# Patient Record
Sex: Female | Born: 1935 | Race: White | Hispanic: No | Smoking: Former smoker
Health system: Southern US, Community
[De-identification: ages and names within clinical notes are randomized; demographics above are authoritative.]

## PROBLEM LIST (undated history)

## (undated) DIAGNOSIS — K635 Polyp of colon: Secondary | ICD-10-CM

## (undated) DIAGNOSIS — K589 Irritable bowel syndrome without diarrhea: Secondary | ICD-10-CM

## (undated) DIAGNOSIS — M47817 Spondylosis without myelopathy or radiculopathy, lumbosacral region: Secondary | ICD-10-CM

## (undated) DIAGNOSIS — I639 Cerebral infarction, unspecified: Secondary | ICD-10-CM

## (undated) DIAGNOSIS — M25519 Pain in unspecified shoulder: Secondary | ICD-10-CM

## (undated) DIAGNOSIS — E669 Obesity, unspecified: Secondary | ICD-10-CM

## (undated) DIAGNOSIS — N200 Calculus of kidney: Secondary | ICD-10-CM

## (undated) DIAGNOSIS — N951 Menopausal and female climacteric states: Secondary | ICD-10-CM

## (undated) DIAGNOSIS — E119 Type 2 diabetes mellitus without complications: Secondary | ICD-10-CM

## (undated) DIAGNOSIS — E785 Hyperlipidemia, unspecified: Secondary | ICD-10-CM

## (undated) DIAGNOSIS — K5790 Diverticulosis of intestine, part unspecified, without perforation or abscess without bleeding: Secondary | ICD-10-CM

## (undated) HISTORY — DX: Diverticulosis of intestine, part unspecified, without perforation or abscess without bleeding: K57.90

## (undated) HISTORY — PX: VAGINAL HYSTERECTOMY: SUR661

## (undated) HISTORY — DX: Cerebral infarction, unspecified: I63.9

## (undated) HISTORY — DX: Polyp of colon: K63.5

## (undated) HISTORY — DX: Menopausal and female climacteric states: N95.1

## (undated) HISTORY — PX: CARPAL TUNNEL RELEASE: SHX101

## (undated) HISTORY — DX: Type 2 diabetes mellitus without complications: E11.9

## (undated) HISTORY — PX: EYE SURGERY: SHX253

## (undated) HISTORY — DX: Spondylosis without myelopathy or radiculopathy, lumbosacral region: M47.817

## (undated) HISTORY — DX: Calculus of kidney: N20.0

## (undated) HISTORY — PX: OTHER SURGICAL HISTORY: SHX169

## (undated) HISTORY — DX: Obesity, unspecified: E66.9

## (undated) HISTORY — PX: CHOLECYSTECTOMY: SHX55

## (undated) HISTORY — DX: Hyperlipidemia, unspecified: E78.5

## (undated) HISTORY — DX: Pain in unspecified shoulder: M25.519

## (undated) HISTORY — DX: Irritable bowel syndrome, unspecified: K58.9

---

## 2000-04-13 ENCOUNTER — Ambulatory Visit (HOSPITAL_COMMUNITY): Admission: RE | Admit: 2000-04-13 | Discharge: 2000-04-13 | Payer: Self-pay | Admitting: Gastroenterology

## 2000-05-07 ENCOUNTER — Observation Stay (HOSPITAL_COMMUNITY): Admission: RE | Admit: 2000-05-07 | Discharge: 2000-05-08 | Payer: Self-pay | Admitting: Surgery

## 2000-11-05 ENCOUNTER — Ambulatory Visit (HOSPITAL_COMMUNITY): Admission: RE | Admit: 2000-11-05 | Discharge: 2000-11-05 | Payer: Self-pay | Admitting: Gastroenterology

## 2002-09-07 ENCOUNTER — Ambulatory Visit (HOSPITAL_COMMUNITY): Admission: RE | Admit: 2002-09-07 | Discharge: 2002-09-07 | Payer: Self-pay | Admitting: Family Medicine

## 2002-09-07 ENCOUNTER — Encounter: Payer: Self-pay | Admitting: Family Medicine

## 2003-08-02 ENCOUNTER — Ambulatory Visit (HOSPITAL_COMMUNITY): Admission: RE | Admit: 2003-08-02 | Discharge: 2003-08-02 | Payer: Self-pay | Admitting: Family Medicine

## 2006-04-05 LAB — HM DEXA SCAN

## 2006-09-07 ENCOUNTER — Other Ambulatory Visit: Admission: RE | Admit: 2006-09-07 | Discharge: 2006-09-07 | Payer: Self-pay | Admitting: Family Medicine

## 2010-08-14 LAB — HM PAP SMEAR

## 2010-12-03 LAB — HEMOCCULT GUIAC POC 1CARD (OFFICE)

## 2010-12-03 LAB — HM DIABETES FOOT EXAM

## 2011-01-21 ENCOUNTER — Encounter: Payer: Self-pay | Admitting: Family Medicine

## 2011-01-21 DIAGNOSIS — E669 Obesity, unspecified: Secondary | ICD-10-CM

## 2011-01-21 DIAGNOSIS — M47817 Spondylosis without myelopathy or radiculopathy, lumbosacral region: Secondary | ICD-10-CM | POA: Insufficient documentation

## 2011-01-21 DIAGNOSIS — M25519 Pain in unspecified shoulder: Secondary | ICD-10-CM | POA: Insufficient documentation

## 2011-01-21 DIAGNOSIS — N200 Calculus of kidney: Secondary | ICD-10-CM | POA: Insufficient documentation

## 2011-01-21 DIAGNOSIS — E1169 Type 2 diabetes mellitus with other specified complication: Secondary | ICD-10-CM | POA: Insufficient documentation

## 2011-01-21 DIAGNOSIS — K589 Irritable bowel syndrome without diarrhea: Secondary | ICD-10-CM | POA: Insufficient documentation

## 2011-01-21 DIAGNOSIS — K635 Polyp of colon: Secondary | ICD-10-CM | POA: Insufficient documentation

## 2011-01-21 DIAGNOSIS — K5790 Diverticulosis of intestine, part unspecified, without perforation or abscess without bleeding: Secondary | ICD-10-CM | POA: Insufficient documentation

## 2011-01-21 DIAGNOSIS — K209 Esophagitis, unspecified without bleeding: Secondary | ICD-10-CM | POA: Insufficient documentation

## 2011-01-21 DIAGNOSIS — E785 Hyperlipidemia, unspecified: Secondary | ICD-10-CM | POA: Insufficient documentation

## 2011-01-21 DIAGNOSIS — N951 Menopausal and female climacteric states: Secondary | ICD-10-CM

## 2012-02-04 LAB — FECAL OCCULT BLOOD, GUAIAC: Fecal Occult Blood: NEGATIVE

## 2012-11-13 ENCOUNTER — Encounter: Payer: Self-pay | Admitting: Family Medicine

## 2012-11-13 DIAGNOSIS — H269 Unspecified cataract: Secondary | ICD-10-CM

## 2012-11-13 DIAGNOSIS — I10 Essential (primary) hypertension: Secondary | ICD-10-CM

## 2012-12-20 ENCOUNTER — Other Ambulatory Visit: Payer: Self-pay | Admitting: *Deleted

## 2012-12-20 DIAGNOSIS — M949 Disorder of cartilage, unspecified: Secondary | ICD-10-CM

## 2012-12-23 ENCOUNTER — Ambulatory Visit: Payer: Self-pay | Admitting: Family Medicine

## 2012-12-24 ENCOUNTER — Encounter: Payer: Self-pay | Admitting: Family Medicine

## 2012-12-24 ENCOUNTER — Ambulatory Visit (INDEPENDENT_AMBULATORY_CARE_PROVIDER_SITE_OTHER): Payer: Medicare Other | Admitting: Family Medicine

## 2012-12-24 VITALS — BP 130/79 | HR 113 | Temp 98.1°F | Ht 72.0 in | Wt 145.0 lb

## 2012-12-24 DIAGNOSIS — G2581 Restless legs syndrome: Secondary | ICD-10-CM

## 2012-12-24 DIAGNOSIS — E785 Hyperlipidemia, unspecified: Secondary | ICD-10-CM

## 2012-12-24 DIAGNOSIS — D649 Anemia, unspecified: Secondary | ICD-10-CM

## 2012-12-24 DIAGNOSIS — I10 Essential (primary) hypertension: Secondary | ICD-10-CM

## 2012-12-24 DIAGNOSIS — E119 Type 2 diabetes mellitus without complications: Secondary | ICD-10-CM

## 2012-12-24 LAB — POCT CBC
Granulocyte percent: 63.3 %G (ref 37–80)
MCH, POC: 32.2 pg — AB (ref 27–31.2)
MCV: 89.5 fL (ref 80–97)
POC LYMPH PERCENT: 29.3 %L (ref 10–50)
Platelet Count, POC: 334 10*3/uL (ref 142–424)
RDW, POC: 11.9 %

## 2012-12-24 LAB — BASIC METABOLIC PANEL WITH GFR
BUN: 13 mg/dL (ref 6–23)
CO2: 27 mEq/L (ref 19–32)
Chloride: 92 mEq/L — ABNORMAL LOW (ref 96–112)
Creat: 0.84 mg/dL (ref 0.50–1.10)
Glucose, Bld: 127 mg/dL — ABNORMAL HIGH (ref 70–99)

## 2012-12-24 LAB — LIPID PANEL
LDL Cholesterol: 40 mg/dL (ref 0–99)
Triglycerides: 238 mg/dL — ABNORMAL HIGH (ref <150)

## 2012-12-24 LAB — HEPATIC FUNCTION PANEL
Alkaline Phosphatase: 67 U/L (ref 39–117)
Indirect Bilirubin: 0.4 mg/dL (ref 0.0–0.9)
Total Bilirubin: 0.5 mg/dL (ref 0.3–1.2)

## 2012-12-24 MED ORDER — PRAMIPEXOLE DIHYDROCHLORIDE 0.125 MG PO TABS: 0.1250 mg | ORAL_TABLET | Freq: Three times a day (TID) | ORAL | Status: DC

## 2012-12-24 NOTE — Progress Notes (Deleted)
  Subjective:    Patient ID: Theresa Watson, female    DOB: 09-01-1933, 79 y.o.   MRN: [id number]  HPI    Review of Systems  Constitutional: Negative.   HENT: Negative.   Respiratory: Negative.   Cardiovascular: Negative.   Gastrointestinal: Positive for diarrhea (occasional).  Genitourinary: Negative.   Musculoskeletal: Positive for back pain (LBP).  Neurological: Negative.   Psychiatric/Behavioral: Positive for sleep disturbance (occasional). Negative for suicidal ideas (occasional).       Objective:   Physical Exam        Assessment & Plan:

## 2012-12-24 NOTE — Progress Notes (Signed)
  Subjective:    Patient ID: Theresa Watson, female    DOB: 02/02/1933, 79 y.o.   MRN: [id number]  HPI This patient presents for recheck of multiple medical problems.   Patient Active Problem List  Diagnosis  . Symptomatic menopausal or female climacteric states  . Other and unspecified hyperlipidemia  . Esophagitis  . Prolapse of vaginal walls without mention of uterine prolapse  . Obesity, mild  . IBS (irritable bowel syndrome)  . Kidney stone  . Lumbosacral spondylosis without myelopathy  . Pain in joint, shoulder region  . Colon polyp  . Diverticulosis  . Hypertension  . Cataracts, bilateral    In addition,   The allergies, current medications, past medical history, surgical history, family and social history are reviewed.  Immunizations reviewed.  Health maintenance reviewed.   Patient brings in her eye exam report for review. She also brings records of her blood sugars for the past 3-4 months. These records shows fairly good blood sugar control throughout the day.   Review of Systems  Respiratory: Negative.   Cardiovascular: Negative.   Gastrointestinal: Positive for diarrhea.  Musculoskeletal: Positive for back pain (LBP).  Neurological: Negative.   Psychiatric/Behavioral: Positive for sleep disturbance (occasional).   she has upper and lower dentures     Objective:   Physical Exam BP 130/79  Pulse 113  Temp(Src) 98.1 F (36.7 C) (Oral)  Ht 6' (1.829 m)  Wt 145 lb (65.772 kg)  BMI 19.66 kg/m2  The patient appeared well nourished and normally developed, alert and oriented to time and place. Speech, behavior and judgement appear normal. Vital signs as documented.  Head exam is unremarkable. No scleral icterus or pallor noted.  Neck is without jugular venous distension, thyromegally, or carotid bruits. Carotid upstrokes are brisk bilaterally. No cervical adenopathy. Lungs are clear anteriorly and posteriorly to auscultation. Normal respiratory  effort. Cardiac exam reveals regular rate and rhythm slightly over 100 . First and second heart sounds normal. No murmurs, rubs or gallops.  Abdominal exam reveals normal bowl sounds, no masses, no organomegaly and no aortic enlargement. No inguinal adenopathy. Extremities are nonedematous and both femoral and pedal pulses are normal. Skin without pallor or jaundice.  Warm and dry, without rash. Neurologic exam reveals normal deep tendon reflexes and normal sensation. Diabetic foot exam was done         Assessment & Plan:  1. Anemia  - POCT CBC - Vitamin D 25 hydroxy  2. Other and unspecified hyperlipidemia  - Lipid panel - Hepatic function panel  3. Essential hypertension, benign  - BASIC METABOLIC PANEL WITH GFR  4. Diabetes  - POCT glycosylated hemoglobin (Hb A1C)  5. Restless leg syndrome  - pramipexole (MIRAPEX) 0.125 MG tablet; Take 1 tablet (0.125 mg total) by mouth 3 (three) times daily.  Dispense: 90 tablet; Refill: 2

## 2012-12-24 NOTE — Patient Instructions (Addendum)
Continue current meds and therapeutic lifestyle changes Decrease caffeine intake . Limit the number of cups of coffee daily Exercise regularly

## 2013-01-18 ENCOUNTER — Other Ambulatory Visit: Payer: Self-pay | Admitting: Family Medicine

## 2013-01-18 NOTE — Telephone Encounter (Signed)
Last labs 8/13

## 2013-02-16 ENCOUNTER — Ambulatory Visit (INDEPENDENT_AMBULATORY_CARE_PROVIDER_SITE_OTHER): Payer: Medicare Other

## 2013-02-16 ENCOUNTER — Ambulatory Visit: Payer: Self-pay

## 2013-02-16 DIAGNOSIS — M899 Disorder of bone, unspecified: Secondary | ICD-10-CM

## 2013-02-21 ENCOUNTER — Other Ambulatory Visit: Payer: Self-pay | Admitting: Nurse Practitioner

## 2013-03-01 ENCOUNTER — Telehealth: Payer: Self-pay | Admitting: Pharmacist

## 2013-03-01 NOTE — Telephone Encounter (Signed)
Patient had DEXA 02/16/13 - results were not discussed with patient that day because I was out of office.  Recommend appointment needed to discuss results and treatment option.  Tried to call - no ans - left message.

## 2013-03-06 ENCOUNTER — Other Ambulatory Visit: Payer: Self-pay | Admitting: Nurse Practitioner

## 2013-03-07 ENCOUNTER — Telehealth: Payer: Self-pay | Admitting: Family Medicine

## 2013-03-08 ENCOUNTER — Other Ambulatory Visit: Payer: Self-pay | Admitting: *Deleted

## 2013-03-08 MED ORDER — TRIAMTERENE-HCTZ 37.5-25 MG PO TABS: 1.0000 | ORAL_TABLET | Freq: Every day | ORAL | Status: DC

## 2013-03-08 MED ORDER — POTASSIUM CHLORIDE CRYS ER 20 MEQ PO TBCR: 20.0000 meq | EXTENDED_RELEASE_TABLET | Freq: Every day | ORAL | Status: DC

## 2013-03-08 MED ORDER — PRAVASTATIN SODIUM 40 MG PO TABS: 40.0000 mg | ORAL_TABLET | Freq: Every day | ORAL | Status: DC

## 2013-03-08 NOTE — Telephone Encounter (Signed)
done

## 2013-03-16 ENCOUNTER — Other Ambulatory Visit (INDEPENDENT_AMBULATORY_CARE_PROVIDER_SITE_OTHER): Payer: Medicare Other

## 2013-03-16 DIAGNOSIS — E559 Vitamin D deficiency, unspecified: Secondary | ICD-10-CM

## 2013-03-16 DIAGNOSIS — I10 Essential (primary) hypertension: Secondary | ICD-10-CM

## 2013-03-16 DIAGNOSIS — R5381 Other malaise: Secondary | ICD-10-CM

## 2013-03-16 DIAGNOSIS — E785 Hyperlipidemia, unspecified: Secondary | ICD-10-CM

## 2013-03-16 DIAGNOSIS — R5383 Other fatigue: Secondary | ICD-10-CM

## 2013-03-16 LAB — POCT CBC
Granulocyte percent: 60.6 %G (ref 37–80)
HCT, POC: 36.7 % — AB (ref 37.7–47.9)
Lymph, poc: 1.8 (ref 0.6–3.4)
MCH, POC: 31.3 pg — AB (ref 27–31.2)
MCV: 90.9 fL (ref 80–97)
Platelet Count, POC: 321 10*3/uL (ref 142–424)
RBC: 4 M/uL — AB (ref 4.04–5.48)
WBC: 5.6 10*3/uL (ref 4.6–10.2)

## 2013-03-16 LAB — BASIC METABOLIC PANEL WITH GFR
BUN: 12 mg/dL (ref 6–23)
Chloride: 99 mEq/L (ref 96–112)
Creat: 0.81 mg/dL (ref 0.50–1.10)
GFR, Est Non African American: 70 mL/min
Glucose, Bld: 112 mg/dL — ABNORMAL HIGH (ref 70–99)
Potassium: 4.1 mEq/L (ref 3.5–5.3)

## 2013-03-16 LAB — HEPATIC FUNCTION PANEL
ALT: 18 U/L (ref 0–35)
AST: 18 U/L (ref 0–37)
Albumin: 4.5 g/dL (ref 3.5–5.2)
Alkaline Phosphatase: 65 U/L (ref 39–117)
Indirect Bilirubin: 0.4 mg/dL (ref 0.0–0.9)
Total Protein: 6.4 g/dL (ref 6.0–8.3)

## 2013-03-16 NOTE — Progress Notes (Unsigned)
Patient came in for labs only.

## 2013-03-17 LAB — NMR LIPOPROFILE WITH LIPIDS
HDL Particle Number: 43.6 umol/L (ref >=30.5)
HDL Size: 9 nm — ABNORMAL LOW (ref >=9.2)
LDL Size: 19.9 nm — ABNORMAL LOW (ref >20.5)
Large HDL-P: 7.5 umol/L (ref >=4.8)
Large VLDL-P: 1.5 nmol/L (ref <=2.7)
Small LDL Particle Number: 714 nmol/L — ABNORMAL HIGH (ref <=527)

## 2013-03-17 LAB — VITAMIN D 25 HYDROXY (VIT D DEFICIENCY, FRACTURES): Vit D, 25-Hydroxy: 58 ng/mL (ref 30–89)

## 2013-03-23 ENCOUNTER — Other Ambulatory Visit: Payer: Self-pay | Admitting: Family Medicine

## 2013-03-30 ENCOUNTER — Encounter: Payer: Self-pay | Admitting: Family Medicine

## 2013-03-30 ENCOUNTER — Other Ambulatory Visit: Payer: Self-pay | Admitting: Family Medicine

## 2013-03-30 ENCOUNTER — Ambulatory Visit (INDEPENDENT_AMBULATORY_CARE_PROVIDER_SITE_OTHER): Payer: Medicare Other | Admitting: Family Medicine

## 2013-03-30 VITALS — BP 123/77 | HR 77 | Temp 98.4°F | Ht 60.0 in | Wt 145.0 lb

## 2013-03-30 DIAGNOSIS — I10 Essential (primary) hypertension: Secondary | ICD-10-CM

## 2013-03-30 DIAGNOSIS — M949 Disorder of cartilage, unspecified: Secondary | ICD-10-CM

## 2013-03-30 DIAGNOSIS — M2550 Pain in unspecified joint: Secondary | ICD-10-CM

## 2013-03-30 DIAGNOSIS — E785 Hyperlipidemia, unspecified: Secondary | ICD-10-CM

## 2013-03-30 DIAGNOSIS — K219 Gastro-esophageal reflux disease without esophagitis: Secondary | ICD-10-CM

## 2013-03-30 DIAGNOSIS — M8580 Other specified disorders of bone density and structure, unspecified site: Secondary | ICD-10-CM

## 2013-03-30 DIAGNOSIS — E119 Type 2 diabetes mellitus without complications: Secondary | ICD-10-CM

## 2013-03-30 LAB — POCT GLYCOSYLATED HEMOGLOBIN (HGB A1C): Hemoglobin A1C: 5.5

## 2013-03-30 LAB — POCT UA - MICROALBUMIN: Microalbumin Ur, POC: NEGATIVE mg/L

## 2013-03-30 NOTE — Progress Notes (Signed)
  Subjective:    Patient ID: Theresa Watson, female    DOB: 01-11-1933, 79 y.o.   MRN: [id number]  HPI Patient comes in today for followup of chronic medical problems and to review her DEXA scan. Patient has a history of cholesterol hypertension reflux and arthralgias. Currently she is only taking calcium and vitamin D for her bones.   Review of Systems  HENT: Negative for ear pain, congestion, sore throat, rhinorrhea, neck pain, postnasal drip and tinnitus.   Eyes: Negative for visual disturbance.  Respiratory: Negative for cough, shortness of breath and wheezing.   Cardiovascular: Negative for chest pain, palpitations and leg swelling.  Gastrointestinal: Negative for vomiting, abdominal pain, diarrhea (she has occasional loose stools which she's had for a long time), constipation, blood in stool, abdominal distention and anal bleeding.       Stools are dark because she is currently taking iron  Genitourinary: Negative for dysuria, urgency, frequency, hematuria, vaginal bleeding, vaginal discharge, difficulty urinating and vaginal pain.  Musculoskeletal: Positive for back pain (Occasional back) and arthralgias (occasional pain in both feet).  Skin: Negative for rash.  Allergic/Immunologic: Negative for environmental allergies.  Neurological: Negative for dizziness, weakness, light-headedness and headaches.  Psychiatric/Behavioral: Negative.  The patient is not nervous/anxious.        Objective:   Physical Exam BP 123/77  Pulse 77  Temp(Src) 98.4 F (36.9 C) (Oral)  Ht 5' (1.524 m)  Wt 145 lb (65.772 kg)  BMI 28.32 kg/m2  The patient appeared well nourished and normally developed for her age, alert and oriented to time and place. Speech, behavior and judgement appear normal. Vital signs as documented.  Head exam is unremarkable. No scleral icterus or pallor noted. Ears nose and throat were within normal limits Neck is without jugular venous distension, thyromegally, or carotid  bruits. Carotid upstrokes are brisk bilaterally. No cervical adenopathy. Lungs are clear anteriorly and posteriorly to auscultation. Normal respiratory effort. Cardiac exam reveals regular rate and rhythm at 72 per minute. First and second heart sounds normal.  No murmurs, rubs or gallops.  Abdominal exam reveals normal bowl sounds, no masses, no organomegaly and no aortic enlargement. No inguinal adenopathy. Extremities are nonedematous and both femoral and pedal pulses are normal. Skin without pallor or jaundice.  Warm and dry, without rash. Neurologic exam reveals normal deep tendon reflexes and normal sensation.  Diabetic foot exam was done  Recent labs were reviewed with patient. Hemoglobin A1c was done today because it did not get done with a previous lab work        Assessment & Plan:  Hyperlipidemia  Hypertension  Arthralgia  GERD (gastroesophageal reflux disease)  Osteopenia  Diabetes - Plan: POCT glycosylated hemoglobin (Hb A1C), POCT UA - Microalbumin  Patient Instructions  Always be careful and did not fall. Watch for you were going. Use night lights in the house. Don'tl have throw rugs in the house. Continue follow your diet and watch what you eat and get plenty of exercise Continue current vitamin D and calcium Continue current iron medication Continued potassium is slightly doing one daily except 3 days a week he take 2 to

## 2013-03-30 NOTE — Patient Instructions (Addendum)
Always be careful and did not fall. Watch for you were going. Use night lights in the house. Don'tl have throw rugs in the house. Continue follow your diet and watch what you eat and get plenty of exercise Continue current vitamin D and calcium Continue current iron medication Continued potassium is slightly doing one daily except 3 days a week he take 2 to

## 2013-04-20 ENCOUNTER — Telehealth: Payer: Self-pay | Admitting: *Deleted

## 2013-04-20 NOTE — Telephone Encounter (Signed)
Message copied by Bearl Mulberry on Wed Apr 20, 2013  5:55 PM ------      Message from: Ernestina Penna      Created: Wed Mar 30, 2013  7:09 PM       Hemoglobin A1c was 5.5%, this indicates good blood sugar control      Urine microalbumin was negative ------

## 2013-04-20 NOTE — Telephone Encounter (Signed)
Pt notified of results

## 2013-04-27 ENCOUNTER — Telehealth: Payer: Self-pay | Admitting: Pharmacist

## 2013-04-27 DIAGNOSIS — M8580 Other specified disorders of bone density and structure, unspecified site: Secondary | ICD-10-CM

## 2013-04-27 MED ORDER — RALOXIFENE HCL 60 MG PO TABS: 60.0000 mg | ORAL_TABLET | Freq: Every day | ORAL | Status: DC

## 2013-04-27 NOTE — Telephone Encounter (Signed)
Called patient to discuss result of Dexa from 02/16/2013.  Had tried calling patient in past but no answer and no returned call.  Dexa showed osteopenia and patient has FRAX estimate indicating high fracture risk  Discussed treatment options and patient decided to try evista 60mg  daily.  Also recommended continue calcium 600mg  bid and vitamin D daily Weight bearing exercise as able daily.

## 2013-06-01 ENCOUNTER — Other Ambulatory Visit: Payer: Self-pay

## 2013-06-01 MED ORDER — EZETIMIBE 10 MG PO TABS: 10.0000 mg | ORAL_TABLET | Freq: Every day | ORAL | Status: DC

## 2013-06-27 ENCOUNTER — Other Ambulatory Visit: Payer: Self-pay | Admitting: Family Medicine

## 2013-06-27 ENCOUNTER — Other Ambulatory Visit: Payer: Self-pay | Admitting: Nurse Practitioner

## 2013-07-06 ENCOUNTER — Ambulatory Visit (INDEPENDENT_AMBULATORY_CARE_PROVIDER_SITE_OTHER): Payer: Medicare Other

## 2013-07-06 DIAGNOSIS — Z23 Encounter for immunization: Secondary | ICD-10-CM

## 2013-08-03 ENCOUNTER — Other Ambulatory Visit: Payer: Self-pay | Admitting: *Deleted

## 2013-08-03 MED ORDER — POTASSIUM CHLORIDE CRYS ER 20 MEQ PO TBCR: 20.0000 meq | EXTENDED_RELEASE_TABLET | Freq: Every day | ORAL | Status: DC

## 2013-08-03 NOTE — Telephone Encounter (Signed)
LAST POT. 03/17/13 WAS 4.1 AND LAST OV 03/30/13.

## 2013-08-16 ENCOUNTER — Other Ambulatory Visit (INDEPENDENT_AMBULATORY_CARE_PROVIDER_SITE_OTHER): Payer: Medicare Other

## 2013-08-16 DIAGNOSIS — E119 Type 2 diabetes mellitus without complications: Secondary | ICD-10-CM

## 2013-08-16 DIAGNOSIS — R5381 Other malaise: Secondary | ICD-10-CM

## 2013-08-16 DIAGNOSIS — E559 Vitamin D deficiency, unspecified: Secondary | ICD-10-CM

## 2013-08-16 DIAGNOSIS — R7309 Other abnormal glucose: Secondary | ICD-10-CM

## 2013-08-16 DIAGNOSIS — E1059 Type 1 diabetes mellitus with other circulatory complications: Secondary | ICD-10-CM

## 2013-08-16 DIAGNOSIS — I10 Essential (primary) hypertension: Secondary | ICD-10-CM

## 2013-08-16 DIAGNOSIS — E785 Hyperlipidemia, unspecified: Secondary | ICD-10-CM

## 2013-08-16 DIAGNOSIS — Z79899 Other long term (current) drug therapy: Secondary | ICD-10-CM

## 2013-08-16 LAB — POCT GLYCOSYLATED HEMOGLOBIN (HGB A1C): Hemoglobin A1C: 5.6

## 2013-08-16 LAB — POCT CBC
HCT, POC: 34.4 % — AB (ref 37.7–47.9)
Lymph, poc: 2.2 (ref 0.6–3.4)
MCHC: 33.8 g/dL (ref 31.8–35.4)
MPV: 8.2 fL (ref 0–99.8)
POC Granulocyte: 2.4 (ref 2–6.9)
POC LYMPH PERCENT: 43.6 %L (ref 10–50)
RBC: 3.9 M/uL — AB (ref 4.04–5.48)
RDW, POC: 12.7 %
WBC: 5.1 10*3/uL (ref 4.6–10.2)

## 2013-08-16 NOTE — Progress Notes (Signed)
Patient came in for labs only.

## 2013-08-17 LAB — MICROALBUMIN, URINE: Microalbumin, Urine: 18 ug/mL — ABNORMAL HIGH (ref 0.0–17.0)

## 2013-08-18 LAB — VITAMIN D 25 HYDROXY (VIT D DEFICIENCY, FRACTURES): Vit D, 25-Hydroxy: 67.8 ng/mL (ref 30.0–100.0)

## 2013-08-18 LAB — BMP8+EGFR
BUN/Creatinine Ratio: 18 (ref 11–26)
BUN: 16 mg/dL (ref 8–27)
CO2: 28 mmol/L (ref 18–29)
Chloride: 96 mmol/L — ABNORMAL LOW (ref 97–108)
Creatinine, Ser: 0.91 mg/dL (ref 0.57–1.00)
GFR calc Af Amer: 70 mL/min/{1.73_m2} (ref >59)
Sodium: 138 mmol/L (ref 134–144)

## 2013-08-18 LAB — NMR, LIPOPROFILE
HDL Cholesterol by NMR: 58 mg/dL (ref >=40)
LDL Particle Number: 1156 nmol/L — ABNORMAL HIGH (ref <1000)
LDL Size: 20 nm — ABNORMAL LOW (ref >20.5)
LDLC SERPL CALC-MCNC: 50 mg/dL (ref <100)
LP-IR Score: 54 — ABNORMAL HIGH (ref <=45)
Small LDL Particle Number: 910 nmol/L — ABNORMAL HIGH (ref <=527)

## 2013-08-18 LAB — HEPATIC FUNCTION PANEL
ALT: 26 IU/L (ref 0–32)
AST: 23 IU/L (ref 0–40)
Alkaline Phosphatase: 73 IU/L (ref 39–117)
Total Bilirubin: 0.4 mg/dL (ref 0.0–1.2)

## 2013-08-24 ENCOUNTER — Other Ambulatory Visit: Payer: Self-pay | Admitting: Family Medicine

## 2013-08-29 ENCOUNTER — Encounter: Payer: Self-pay | Admitting: Family Medicine

## 2013-08-29 ENCOUNTER — Ambulatory Visit (INDEPENDENT_AMBULATORY_CARE_PROVIDER_SITE_OTHER): Payer: Medicare Other | Admitting: Family Medicine

## 2013-08-29 ENCOUNTER — Other Ambulatory Visit: Payer: Medicare Other

## 2013-08-29 VITALS — BP 143/82 | HR 95 | Temp 98.3°F | Ht 60.0 in | Wt 142.0 lb

## 2013-08-29 DIAGNOSIS — Z23 Encounter for immunization: Secondary | ICD-10-CM

## 2013-08-29 DIAGNOSIS — E1149 Type 2 diabetes mellitus with other diabetic neurological complication: Secondary | ICD-10-CM | POA: Insufficient documentation

## 2013-08-29 DIAGNOSIS — M47817 Spondylosis without myelopathy or radiculopathy, lumbosacral region: Secondary | ICD-10-CM

## 2013-08-29 DIAGNOSIS — E785 Hyperlipidemia, unspecified: Secondary | ICD-10-CM

## 2013-08-29 DIAGNOSIS — E119 Type 2 diabetes mellitus without complications: Secondary | ICD-10-CM | POA: Insufficient documentation

## 2013-08-29 DIAGNOSIS — I10 Essential (primary) hypertension: Secondary | ICD-10-CM

## 2013-08-29 NOTE — Addendum Note (Signed)
Addended by: Magdalene River on: 08/29/2013 04:14 PM   Modules accepted: Orders

## 2013-08-29 NOTE — Progress Notes (Signed)
Patient dropped off fobt 

## 2013-08-29 NOTE — Patient Instructions (Signed)
Continue current medication Stay current on flu shot and you may need a Prevnar Always be careful and did not put yourself at risk for falling Drink plenty of fluids Continue aggressive therapeutic lifestyle changes which include diet and exercise Check blood sugars regularly and bring these readings to your next office

## 2013-08-29 NOTE — Progress Notes (Signed)
Subjective:    Patient ID: Theresa Watson, female    DOB: [birth year], 79 y.o.   MRN: [id number]  HPI Pt here for follow up and management of chronic medical problems. Her biggest complaint today is her back pain. Patient comes in today with her daughter. She is active and able to take care of her daily living requirements . Recent labs were reviewed with patient and her daughter. Health maintenance a Prevnar vaccination is needed. She will be scheduled for a mammogram as she leaves. appear      Patient Active Problem List   Diagnosis Date Noted  . Hypertension 11/13/2012  . Cataracts, bilateral 11/13/2012  . Symptomatic menopausal or female climacteric states   . Other and unspecified hyperlipidemia   . Esophagitis   . 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 Obesity, mild   . IBS (irritable bowel syndrome)   . Kidney stone   . Lumbosacral spondylosis without myelopathy   . Pain in joint, shoulder region   . Colon polyp   . Diverticulosis    Outpatient Encounter Prescriptions as of 08/29/2013  Medication Sig  . ACCU-CHEK AVIVA PLUS test strip   . aspirin 81 MG EC tablet Take 81 mg by mouth daily.    . Cholecalciferol (VITAMIN D3) 2000 UNITS TABS Take 1 tablet by mouth daily.    Marland Kitchen ezetimibe (ZETIA) 10 MG tablet Take 1 tablet (10 mg total) by mouth daily.  . ferrous sulfate 325 (65 FE) MG EC tablet Take 325 mg by mouth daily with breakfast.    . Garlic 100 MG TABS Take 1 tablet by mouth 2 (two) times daily.   Marland Kitchen geriatric multivitamins-minerals (ELDERTONIC/GEVRABON) ELIX Take 15 mLs by mouth daily.    . metFORMIN (GLUCOPHAGE) 500 MG tablet TAKE ONE TABLET BY MOUTH TWICE DAILY  . Omega-3 Fatty Acids (FISH OIL) 1000 MG CAPS Take 2 capsules by mouth daily.   Marland Kitchen omeprazole (PRILOSEC) 20 MG capsule Take 20 mg by mouth daily.    . potassium chloride SA (K-DUR,KLOR-CON) 20 MEQ tablet Take 1 tablet (20 mEq total) by mouth daily.  . pravastatin (PRAVACHOL) 40 MG  tablet Take 1 tablet (40 mg total) by mouth daily.  Marland Kitchen triamterene-hydrochlorothiazide (MAXZIDE-25) 37.5-25 MG per tablet Take 1 tablet by mouth daily.  . [DISCONTINUED] pramipexole (MIRAPEX) 0.125 MG tablet Take 1 tablet (0.125 mg total) by mouth 3 (three) times daily.  . [DISCONTINUED] pravastatin (PRAVACHOL) 40 MG tablet TAKE TWO TABLETS BY MOUTH DAILY  . [DISCONTINUED] raloxifene (EVISTA) 60 MG tablet Take 1 tablet (60 mg total) by mouth daily. For bones    Review of Systems  Constitutional: Negative.   HENT: Negative.   Eyes: Negative.   Respiratory: Negative.   Cardiovascular: Negative.   Gastrointestinal: Negative.   Endocrine: Negative.   Genitourinary: Negative.   Musculoskeletal: Positive for back pain.  Skin: Negative.   Allergic/Immunologic: Negative.   Neurological: Negative.   Hematological: Negative.   Psychiatric/Behavioral: Negative.        Objective:   Physical Exam  Nursing note and vitals reviewed. Constitutional: She is oriented to person, place, and time. She appears well-developed and well-nourished. No distress.  HENT:  Head: Normocephalic.  Right Ear: External ear normal.  Left Ear: External ear normal.  Nose: Nose normal.  Mouth/Throat: Oropharynx is clear and moist.  She has dentures in place.  Eyes: Conjunctivae and EOM are normal. Pupils are equal, round, and reactive to light. Right eye exhibits no  discharge. Left eye exhibits no discharge. No scleral icterus.  Neck: Normal range of motion. Neck supple. No thyromegaly present.  No bruits in the carotids bilaterally  Cardiovascular: Normal rate, regular rhythm, normal heart sounds and intact distal pulses.  Exam reveals no gallop and no friction rub.   No murmur heard.  At 96 per minute  Pulmonary/Chest: Effort normal and breath sounds normal. No respiratory distress. She has no wheezes. She has no rales. She exhibits no tenderness.  Abdominal: Soft. Bowel sounds are normal. She exhibits no mass.  There is no tenderness. There is no rebound and no guarding.  Musculoskeletal: Normal range of motion. She exhibits no edema and no tenderness.  Lymphadenopathy:    She has no cervical adenopathy.  Neurological: She is alert and oriented to person, place, and time. She has normal reflexes.  Skin: Skin is warm and dry. No rash noted.  Psychiatric: She has a normal mood and affect. Her behavior is normal. Judgment and thought content normal.   BP 143/82  Pulse 95  Temp(Src) 98.3 F (36.8 C) (Oral)  Ht 5' (1.524 m)  Wt 142 lb (64.411 kg)  BMI 27.73 kg/m2        Assessment & Plan:   1. Diabetes mellitus   2. Hypertension   3. Hyperlipidemia   4. Lumbosacral spondylosis without myelopathy   5. Type II or unspecified type diabetes mellitus without mention of complication, not stated as uncontrolled    No orders of the defined types were placed in this encounter.   No orders of the defined types were placed in this encounter.   Patient Instructions  Continue current medication Stay current on flu shot and you may need a Prevnar Always be careful and did not put yourself at risk for falling Drink plenty of fluids Continue aggressive therapeutic lifestyle changes which include diet and exercise Check blood sugars regularly and bring these readings to your next office    Nyra Capes MD

## 2013-08-31 ENCOUNTER — Telehealth: Payer: Self-pay | Admitting: Family Medicine

## 2013-08-31 ENCOUNTER — Encounter: Payer: Self-pay | Admitting: *Deleted

## 2013-08-31 LAB — FECAL OCCULT BLOOD, IMMUNOCHEMICAL: Fecal Occult Bld: NEGATIVE

## 2013-08-31 NOTE — Progress Notes (Signed)
Quick Note:  Copy of labs sent to patient ______ 

## 2013-09-05 MED ORDER — POTASSIUM CHLORIDE CRYS ER 20 MEQ PO TBCR: 20.0000 meq | EXTENDED_RELEASE_TABLET | Freq: Every day | ORAL | Status: DC

## 2013-09-05 MED ORDER — METFORMIN HCL 500 MG PO TABS: 500.0000 mg | ORAL_TABLET | Freq: Two times a day (BID) | ORAL | Status: DC

## 2013-09-05 NOTE — Telephone Encounter (Signed)
DONE

## 2013-09-07 ENCOUNTER — Other Ambulatory Visit: Payer: Self-pay | Admitting: *Deleted

## 2013-09-07 MED ORDER — TRIAMTERENE-HCTZ 37.5-25 MG PO TABS: 1.0000 | ORAL_TABLET | Freq: Every day | ORAL | Status: DC

## 2013-09-07 MED ORDER — PRAVASTATIN SODIUM 40 MG PO TABS: 40.0000 mg | ORAL_TABLET | Freq: Every day | ORAL | Status: DC

## 2013-09-13 ENCOUNTER — Telehealth: Payer: Self-pay | Admitting: Family Medicine

## 2013-09-15 MED ORDER — PRAVASTATIN SODIUM 80 MG PO TABS: 80.0000 mg | ORAL_TABLET | Freq: Every day | ORAL | Status: DC

## 2013-09-15 NOTE — Telephone Encounter (Signed)
Pravastatin 40 - 2 a day will no loger  Be covered by her insurance, we had to change to 80 mg - 1 a day.

## 2013-09-16 ENCOUNTER — Other Ambulatory Visit: Payer: Self-pay

## 2013-09-16 MED ORDER — TRIAMTERENE-HCTZ 37.5-25 MG PO TABS: 1.0000 | ORAL_TABLET | Freq: Every day | ORAL | Status: DC

## 2013-11-01 ENCOUNTER — Ambulatory Visit (INDEPENDENT_AMBULATORY_CARE_PROVIDER_SITE_OTHER): Payer: Medicare Other | Admitting: Nurse Practitioner

## 2013-11-01 VITALS — BP 149/74 | HR 89 | Temp 97.3°F | Wt 142.0 lb

## 2013-11-01 DIAGNOSIS — F4321 Adjustment disorder with depressed mood: Secondary | ICD-10-CM

## 2013-11-01 MED ORDER — CLORAZEPATE DIPOTASSIUM 3.75 MG PO TABS: 3.7500 mg | ORAL_TABLET | Freq: Two times a day (BID) | ORAL | Status: DC | PRN

## 2013-11-01 NOTE — Progress Notes (Signed)
   Subjective:    Patient ID: Theresa Watson, female    DOB: [birth year], 80 y.o.   MRN: [id number]  HPI  Patient brought in by daughter with c/o anxiety- patient sister passed away this morning and patient is having a real hard time dealing with it- she is not sleeping and has poor appetite. Can't quit cring.    Review of Systems  Eyes: Negative.   Cardiovascular: Negative.   All other systems reviewed and are negative.       Objective:   Physical Exam  Constitutional: She is oriented to person, place, and time. She appears well-developed and well-nourished.  Cardiovascular: Normal rate, regular rhythm and normal heart sounds.   Pulmonary/Chest: Effort normal and breath sounds normal.  Neurological: She is alert and oriented to person, place, and time.  Skin: Skin is warm.  Psychiatric: Her speech is normal and behavior is normal. Judgment and thought content normal. Her mood appears anxious. Cognition and memory are normal.  Tearful throughout exam   BP 149/74  Pulse 89  Temp(Src) 97.3 F (36.3 C) (Oral)  Wt 142 lb (64.411 kg)        Assessment & Plan:   1. Grief reaction    Meds ordered this encounter  Medications  . clorazepate (TRANXENE) 3.75 MG tablet    Sig: Take 1 tablet (3.75 mg total) by mouth 2 (two) times daily as needed for anxiety.    Dispense:  30 tablet    Refill:  1    Order Specific Question:  Supervising Provider    Answer:  [name] [[id number]]   Reviewed stress management Grief counseling recommended rto prn Mary-Margaret Hassell Done, FNP

## 2013-11-01 NOTE — Patient Instructions (Signed)
Grief Reaction  Grief is a normal response to the death of someone close to you. Feelings of fear, anger, and guilt can affect almost everyone who loses someone they love. Symptoms of depression are also common. These include problems with sleep, loss of appetite, and lack of energy. These grief reaction symptoms often last for weeks to months after a loss. They may also return during special times that remind you of the person you lost, such as an anniversary or birthday.  Anxiety, insomnia, irritability, and deep depression may last beyond the period of normal grief. If you experience these feelings for 6 months or longer, you may have clinical depression. Clinical depression requires further medical attention. If you think that you have clinical depression, you should contact your caregiver. If you have a history of depression and or a family history of depression, you are at greater risk of clinical depression. You are also at greater risk of developing clinical depression if the loss was traumatic or the loss was of someone with whom you had unresolved issues.   A grief reaction can become complicated by being blocked. This means being unable to cry or express extreme emotions. This may prolong the grieving period and worsen the emotional effects of the loss. Mourning is a natural event in human life. A healthy grief reaction is one that is not blocked . It requires a time of sadness and readjustment.It is very important to share your sorrow and fear with others, especially close friends and family. Professional counselors and clergy can also help you process your grief.  Document Released: 09/22/2005 Document Revised: 12/15/2011 Document Reviewed: 06/02/2006  [employer] Patient Information 2014 [employer]

## 2013-12-14 ENCOUNTER — Encounter: Payer: Self-pay | Admitting: Family Medicine

## 2013-12-14 ENCOUNTER — Ambulatory Visit (INDEPENDENT_AMBULATORY_CARE_PROVIDER_SITE_OTHER): Payer: Medicare Other | Admitting: Family Medicine

## 2013-12-14 VITALS — BP 123/76 | HR 77 | Temp 99.1°F | Ht 60.0 in | Wt 145.0 lb

## 2013-12-14 DIAGNOSIS — E119 Type 2 diabetes mellitus without complications: Secondary | ICD-10-CM

## 2013-12-14 DIAGNOSIS — E785 Hyperlipidemia, unspecified: Secondary | ICD-10-CM

## 2013-12-14 DIAGNOSIS — E559 Vitamin D deficiency, unspecified: Secondary | ICD-10-CM

## 2013-12-14 DIAGNOSIS — I10 Essential (primary) hypertension: Secondary | ICD-10-CM

## 2013-12-14 NOTE — Progress Notes (Signed)
Subjective:    Patient ID: Theresa Watson, female    DOB: 1932-10-14, 80 y.o.   MRN: [id number]  HPI Pt here for follow up and management of chronic medical problems. The patient lost her sister in the past couple of months. See her home blood sugar readings most of these are within a good range. The patient is in good spirits and indicates that she's not been out of the house a lot colder weather this winter appear    \   Patient Active Problem List   Diagnosis Date Noted  . Diabetes mellitus 08/29/2013  . Type II or unspecified type diabetes mellitus without mention of complication, not stated as uncontrolled 08/29/2013  . Hypertension 11/13/2012  . Cataracts, bilateral 11/13/2012  . Symptomatic menopausal or female climacteric states   . Hyperlipidemia   . Esophagitis   . 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 Obesity, mild   . IBS (irritable bowel syndrome)   . Kidney stone   . Lumbosacral spondylosis without myelopathy   . Pain in joint, shoulder region   . Colon polyp   . Diverticulosis    Outpatient Encounter Prescriptions as of 12/14/2013  Medication Sig  . ACCU-CHEK AVIVA PLUS test strip   . aspirin 81 MG EC tablet Take 81 mg by mouth daily.    . Cholecalciferol (VITAMIN D3) 2000 UNITS TABS Take 1 tablet by mouth daily.    . clorazepate (TRANXENE) 3.75 MG tablet Take 1 tablet (3.75 mg total) by mouth 2 (two) times daily as needed for anxiety.  Marland Kitchen ezetimibe (ZETIA) 10 MG tablet Take 1 tablet (10 mg total) by mouth daily.  . ferrous sulfate 325 (65 FE) MG EC tablet Take 325 mg by mouth daily with breakfast.    . Garlic 003 MG TABS Take 1 tablet by mouth 2 (two) times daily.   Marland Kitchen geriatric multivitamins-minerals (ELDERTONIC/GEVRABON) ELIX Take 15 mLs by mouth daily.    . metFORMIN (GLUCOPHAGE) 500 MG tablet Take 1 tablet (500 mg total) by mouth 2 (two) times daily with a meal.  . Omega-3 Fatty Acids (FISH OIL) 1000 MG CAPS Take 2 capsules by  mouth daily.   Marland Kitchen omeprazole (PRILOSEC) 20 MG capsule Take 20 mg by mouth daily.    . potassium chloride SA (K-DUR,KLOR-CON) 20 MEQ tablet Take 1 tablet (20 mEq total) by mouth daily.  . pravastatin (PRAVACHOL) 80 MG tablet Take 1 tablet (80 mg total) by mouth daily.  Marland Kitchen triamterene-hydrochlorothiazide (MAXZIDE-25) 37.5-25 MG per tablet Take 1 tablet by mouth daily.    Review of Systems  Constitutional: Negative.   HENT: Negative.   Eyes: Negative.   Respiratory: Negative.   Cardiovascular: Negative.   Gastrointestinal: Negative.   Endocrine: Negative.   Genitourinary: Negative.   Musculoskeletal: Negative.   Skin: Negative.   Allergic/Immunologic: Negative.   Neurological: Negative.   Hematological: Negative.   Psychiatric/Behavioral: Negative.        Objective:   Physical Exam  Nursing note and vitals reviewed. Constitutional: She is oriented to person, place, and time. She appears well-developed and well-nourished. No distress.  HENT:  Head: Normocephalic and atraumatic.  Right Ear: External ear normal.  Left Ear: External ear normal.  Nose: Nose normal.  Mouth/Throat: Oropharynx is clear and moist.  Eyes: Conjunctivae and EOM are normal. Pupils are equal, round, and reactive to light. Right eye exhibits no discharge. Left eye exhibits no discharge. No scleral icterus.  Neck: Normal range of  motion. Neck supple. No thyromegaly present.  Cardiovascular: Normal rate, regular rhythm, normal heart sounds and intact distal pulses.  Exam reveals no gallop and no friction rub.   No murmur heard. At 60 per minute with a regular rate and rhythm  Pulmonary/Chest: Effort normal and breath sounds normal. No respiratory distress. She has no wheezes. She has no rales. She exhibits no tenderness.  No axillary nodes  Abdominal: Soft. Bowel sounds are normal. She exhibits no mass. There is no tenderness. There is no rebound and no guarding.  Obesity  Musculoskeletal: Normal range of  motion. She exhibits no edema and no tenderness.  Lymphadenopathy:    She has no cervical adenopathy.  Neurological: She is alert and oriented to person, place, and time. She has normal reflexes.  Skin: Skin is warm and dry.  Psychiatric: She has a normal mood and affect. Her behavior is normal. Judgment and thought content normal.   BP 123/76  Pulse 77  Temp(Src) 99.1 F (37.3 C) (Oral)  Ht 5' (1.524 m)  Wt 145 lb (65.772 kg)  BMI 28.32 kg/m2        Assessment & Plan:  1. Hyperlipidemia - POCT CBC; Future - NMR, lipoprofile; Future  2. Hypertension - POCT CBC; Future - BMP8+EGFR; Future - Hepatic function panel; Future  3. Type II or unspecified type diabetes mellitus without mention of complication, not stated as uncontrolled - POCT CBC; Future - POCT glycosylated hemoglobin (Hb A1C); Future - BMP8+EGFR; Future  4. Vitamin D deficiency - Vit D  25 hydroxy (rtn osteoporosis monitoring); Future  Patient Instructions                       Medicare Annual Wellness Visit  [facility] and the medical providers at [facility] strive to bring you the best medical care.  In doing so we not only want to address your current medical conditions and concerns but also to detect new conditions early and prevent illness, disease and health-related problems.    Medicare offers a yearly Wellness Visit which allows our clinical staff to assess your need for preventative services including immunizations, lifestyle education, counseling to decrease risk of preventable diseases and screening for fall risk and other medical concerns.    This visit is provided free of charge (no copay) for all Medicare recipients. The clinical pharmacists at [facility] have begun to conduct these Wellness Visits which will also include a thorough review of all your medications.    As you primary medical provider recommend that you make an appointment for your  Annual Wellness Visit if you have not done so already this year.  You may set up this appointment before you leave today or you may call back [phone]) and schedule an appointment.  Please make sure when you call that you mention that you are scheduling your Annual Wellness Visit with the clinical pharmacist so that the appointment may be made for the proper length of time.     Continue current medications. Continue good therapeutic lifestyle changes which include good diet and exercise. Fall precautions discussed with patient. If an FOBT was given today- please return it to our front desk. If you are over 78 years old - you may need Prevnar 41 or the adult Pneumonia vaccine.  Exercise as much is possible watch diet closely and drink plenty water Return to clinic and get lab work as planned   Arrie Senate MD

## 2013-12-14 NOTE — Patient Instructions (Addendum)
Medicare Annual Wellness Visit  [facility] and the medical providers at [facility] strive to bring you the best medical care.  In doing so we not only want to address your current medical conditions and concerns but also to detect new conditions early and prevent illness, disease and health-related problems.    Medicare offers a yearly Wellness Visit which allows our clinical staff to assess your need for preventative services including immunizations, lifestyle education, counseling to decrease risk of preventable diseases and screening for fall risk and other medical concerns.    This visit is provided free of charge (no copay) for all Medicare recipients. The clinical pharmacists at [facility] have begun to conduct these Wellness Visits which will also include a thorough review of all your medications.    As you primary medical provider recommend that you make an appointment for your Annual Wellness Visit if you have not done so already this year.  You may set up this appointment before you leave today or you may call back [phone]) and schedule an appointment.  Please make sure when you call that you mention that you are scheduling your Annual Wellness Visit with the clinical pharmacist so that the appointment may be made for the proper length of time.     Continue current medications. Continue good therapeutic lifestyle changes which include good diet and exercise. Fall precautions discussed with patient. If an FOBT was given today- please return it to our front desk. If you are over 19 years old - you may need Prevnar 1 or the adult Pneumonia vaccine.  Exercise as much is possible watch diet closely and drink plenty water Return to clinic and get lab work as planned

## 2013-12-20 ENCOUNTER — Other Ambulatory Visit (INDEPENDENT_AMBULATORY_CARE_PROVIDER_SITE_OTHER): Payer: Medicare Other

## 2013-12-20 DIAGNOSIS — E785 Hyperlipidemia, unspecified: Secondary | ICD-10-CM

## 2013-12-20 DIAGNOSIS — E119 Type 2 diabetes mellitus without complications: Secondary | ICD-10-CM

## 2013-12-20 DIAGNOSIS — I10 Essential (primary) hypertension: Secondary | ICD-10-CM

## 2013-12-20 DIAGNOSIS — E559 Vitamin D deficiency, unspecified: Secondary | ICD-10-CM

## 2013-12-20 LAB — POCT CBC
Granulocyte percent: 57 %G (ref 37–80)
HEMATOCRIT: 36.1 % — AB (ref 37.7–47.9)
HEMOGLOBIN: 11.6 g/dL — AB (ref 12.2–16.2)
Lymph, poc: 1.8 (ref 0.6–3.4)
MCH: 29.1 pg (ref 27–31.2)
MCHC: 32 g/dL (ref 31.8–35.4)
MCV: 90.7 fL (ref 80–97)
MPV: 7.5 fL (ref 0–99.8)
POC GRANULOCYTE: 2.8 (ref 2–6.9)
POC LYMPH PERCENT: 35.4 %L (ref 10–50)
Platelet Count, POC: 300 10*3/uL (ref 142–424)
RBC: 4 M/uL — AB (ref 4.04–5.48)
RDW, POC: 13 %
WBC: 5 10*3/uL (ref 4.6–10.2)

## 2013-12-20 LAB — POCT GLYCOSYLATED HEMOGLOBIN (HGB A1C)

## 2013-12-20 NOTE — Progress Notes (Signed)
PT CAME IN FOR LABS ONLY 

## 2013-12-21 LAB — BMP8+EGFR
BUN / CREAT RATIO: 14 (ref 11–26)
BUN: 11 mg/dL (ref 8–27)
CALCIUM: 10 mg/dL (ref 8.7–10.3)
CO2: 25 mmol/L (ref 18–29)
CREATININE: 0.77 mg/dL (ref 0.57–1.00)
Chloride: 96 mmol/L — ABNORMAL LOW (ref 97–108)
GFR calc Af Amer: 86 mL/min/{1.73_m2} (ref >59)
GFR, EST NON AFRICAN AMERICAN: 74 mL/min/{1.73_m2} (ref >59)
Glucose: 113 mg/dL — ABNORMAL HIGH (ref 65–99)
Potassium: 3.9 mmol/L (ref 3.5–5.2)
Sodium: 137 mmol/L (ref 134–144)

## 2013-12-21 LAB — NMR, LIPOPROFILE
Cholesterol: 127 mg/dL (ref <200)
HDL Cholesterol by NMR: 58 mg/dL (ref >=40)
HDL Particle Number: 41.7 umol/L (ref >=30.5)
LDL Particle Number: 691 nmol/L (ref <1000)
LDL Size: 19.9 nm — ABNORMAL LOW (ref >20.5)
LDLC SERPL CALC-MCNC: 49 mg/dL (ref <100)
LP-IR SCORE: 26 (ref <=45)
SMALL LDL PARTICLE NUMBER: 451 nmol/L (ref <=527)
TRIGLYCERIDES BY NMR: 99 mg/dL (ref <150)

## 2013-12-21 LAB — HEPATIC FUNCTION PANEL
ALK PHOS: 71 IU/L (ref 39–117)
ALT: 20 IU/L (ref 0–32)
AST: 18 IU/L (ref 0–40)
Albumin: 4.4 g/dL (ref 3.5–4.8)
BILIRUBIN TOTAL: 0.5 mg/dL (ref 0.0–1.2)
Bilirubin, Direct: 0.15 mg/dL (ref 0.00–0.40)
Total Protein: 6.7 g/dL (ref 6.0–8.5)

## 2013-12-21 LAB — VITAMIN D 25 HYDROXY (VIT D DEFICIENCY, FRACTURES): VIT D 25 HYDROXY: 60.3 ng/mL (ref 30.0–100.0)

## 2014-01-26 ENCOUNTER — Encounter: Payer: Self-pay | Admitting: *Deleted

## 2014-02-25 ENCOUNTER — Other Ambulatory Visit: Payer: Self-pay | Admitting: Family Medicine

## 2014-03-29 ENCOUNTER — Other Ambulatory Visit: Payer: Self-pay | Admitting: Family Medicine

## 2014-04-10 ENCOUNTER — Other Ambulatory Visit (INDEPENDENT_AMBULATORY_CARE_PROVIDER_SITE_OTHER): Payer: Medicare Other

## 2014-04-10 ENCOUNTER — Other Ambulatory Visit: Payer: Self-pay | Admitting: *Deleted

## 2014-04-10 DIAGNOSIS — E1059 Type 1 diabetes mellitus with other circulatory complications: Secondary | ICD-10-CM

## 2014-04-10 DIAGNOSIS — E785 Hyperlipidemia, unspecified: Secondary | ICD-10-CM

## 2014-04-10 DIAGNOSIS — E559 Vitamin D deficiency, unspecified: Secondary | ICD-10-CM

## 2014-04-10 DIAGNOSIS — I10 Essential (primary) hypertension: Secondary | ICD-10-CM

## 2014-04-10 LAB — POCT CBC
Granulocyte percent: 63.4 %G (ref 37–80)
HCT, POC: 35.7 % — AB (ref 37.7–47.9)
Hemoglobin: 11.9 g/dL — AB (ref 12.2–16.2)
LYMPH, POC: 2 (ref 0.6–3.4)
MCH, POC: 30.3 pg (ref 27–31.2)
MCHC: 33.4 g/dL (ref 31.8–35.4)
MCV: 90.6 fL (ref 80–97)
MPV: 7.9 fL (ref 0–99.8)
PLATELET COUNT, POC: 354 10*3/uL (ref 142–424)
POC Granulocyte: 3.9 (ref 2–6.9)
POC LYMPH PERCENT: 31.6 %L (ref 10–50)
RBC: 3.9 M/uL — AB (ref 4.04–5.48)
RDW, POC: 12.5 %
WBC: 6.2 10*3/uL (ref 4.6–10.2)

## 2014-04-10 LAB — POCT GLYCOSYLATED HEMOGLOBIN (HGB A1C): Hemoglobin A1C: 5.8

## 2014-04-10 MED ORDER — EZETIMIBE 10 MG PO TABS: 10.0000 mg | ORAL_TABLET | Freq: Every day | ORAL | Status: DC

## 2014-04-10 NOTE — Telephone Encounter (Signed)
Patient requesting Zetia samples Samples provided

## 2014-04-11 LAB — BMP8+EGFR
BUN / CREAT RATIO: 19 (ref 11–26)
BUN: 14 mg/dL (ref 8–27)
CHLORIDE: 99 mmol/L (ref 97–108)
CO2: 27 mmol/L (ref 18–29)
Calcium: 10.3 mg/dL (ref 8.7–10.3)
Creatinine, Ser: 0.75 mg/dL (ref 0.57–1.00)
GFR calc Af Amer: 88 mL/min/{1.73_m2} (ref >59)
GFR calc non Af Amer: 77 mL/min/{1.73_m2} (ref >59)
Glucose: 106 mg/dL — ABNORMAL HIGH (ref 65–99)
Potassium: 4.1 mmol/L (ref 3.5–5.2)
Sodium: 141 mmol/L (ref 134–144)

## 2014-04-11 LAB — NMR, LIPOPROFILE
Cholesterol: 127 mg/dL (ref 100–199)
HDL CHOLESTEROL BY NMR: 50 mg/dL (ref >39)
HDL PARTICLE NUMBER: 40.2 umol/L (ref >=30.5)
LDL Particle Number: 803 nmol/L (ref <1000)
LDL Size: 20.1 nm (ref >20.5)
LDLC SERPL CALC-MCNC: 53 mg/dL (ref 0–99)
LP-IR SCORE: 65 — AB (ref <=45)
Small LDL Particle Number: 474 nmol/L (ref <=527)
Triglycerides by NMR: 122 mg/dL (ref 0–149)

## 2014-04-11 LAB — HEPATIC FUNCTION PANEL
ALK PHOS: 65 IU/L (ref 39–117)
ALT: 25 IU/L (ref 0–32)
AST: 24 IU/L (ref 0–40)
Albumin: 4.4 g/dL (ref 3.5–4.8)
BILIRUBIN DIRECT: 0.15 mg/dL (ref 0.00–0.40)
Total Bilirubin: 0.5 mg/dL (ref 0.0–1.2)
Total Protein: 6.3 g/dL (ref 6.0–8.5)

## 2014-04-11 LAB — VITAMIN D 25 HYDROXY (VIT D DEFICIENCY, FRACTURES): Vit D, 25-Hydroxy: 54.3 ng/mL (ref 30.0–100.0)

## 2014-04-19 ENCOUNTER — Encounter: Payer: Self-pay | Admitting: Family Medicine

## 2014-04-19 ENCOUNTER — Ambulatory Visit (INDEPENDENT_AMBULATORY_CARE_PROVIDER_SITE_OTHER): Payer: Medicare Other

## 2014-04-19 ENCOUNTER — Ambulatory Visit: Payer: Medicare Other | Admitting: Family Medicine

## 2014-04-19 ENCOUNTER — Ambulatory Visit (INDEPENDENT_AMBULATORY_CARE_PROVIDER_SITE_OTHER): Payer: Medicare Other | Admitting: Family Medicine

## 2014-04-19 VITALS — BP 128/72 | HR 85 | Temp 96.6°F | Ht 60.0 in | Wt 146.0 lb

## 2014-04-19 DIAGNOSIS — E1149 Type 2 diabetes mellitus with other diabetic neurological complication: Secondary | ICD-10-CM

## 2014-04-19 DIAGNOSIS — E1143 Type 2 diabetes mellitus with diabetic autonomic (poly)neuropathy: Secondary | ICD-10-CM

## 2014-04-19 DIAGNOSIS — E119 Type 2 diabetes mellitus without complications: Secondary | ICD-10-CM

## 2014-04-19 DIAGNOSIS — G909 Disorder of the autonomic nervous system, unspecified: Secondary | ICD-10-CM

## 2014-04-19 DIAGNOSIS — I10 Essential (primary) hypertension: Secondary | ICD-10-CM

## 2014-04-19 DIAGNOSIS — E785 Hyperlipidemia, unspecified: Secondary | ICD-10-CM

## 2014-04-19 NOTE — Patient Instructions (Addendum)
Medicare Annual Wellness Visit  [facility] and the medical providers at [facility] strive to bring you the best medical care.  In doing so we not only want to address your current medical conditions and concerns but also to detect new conditions early and prevent illness, disease and health-related problems.    Medicare offers a yearly Wellness Visit which allows our clinical staff to assess your need for preventative services including immunizations, lifestyle education, counseling to decrease risk of preventable diseases and screening for fall risk and other medical concerns.    This visit is provided free of charge (no copay) for all Medicare recipients. The clinical pharmacists at [facility] have begun to conduct these Wellness Visits which will also include a thorough review of all your medications.    As you primary medical provider recommend that you make an appointment for your Annual Wellness Visit if you have not done so already this year.  You may set up this appointment before you leave today or you may call back [phone]) and schedule an appointment.  Please make sure when you call that you mention that you are scheduling your Annual Wellness Visit with the clinical pharmacist so that the appointment may be made for the proper length of time.      Continue current medications. Continue good therapeutic lifestyle changes which include good diet and exercise. Fall precautions discussed with patient. If an FOBT was given today- please return it to our front desk. If you are over 78 years old - you may need Prevnar 95 or the adult Pneumonia vaccine.  The patient's blood pressure is under good control today. She is unable to take ACE inhibitors because they cause a cough It is important for her to keep her feet and toes monitored and to keep her nails trimmed regularly by a professional people If the numbness and  tingling continue we will consider trying gabapentin in the feet We will call you the results of a chest x-ray once those results are unavailable Continue to check your blood sugars regularly

## 2014-04-19 NOTE — Progress Notes (Signed)
Subjective:    Patient ID: Theresa Watson, female    DOB: [birth year], 80 y.o.   MRN: [id number]  HPI Pt here for follow up and management of chronic medical problems. The patient complains of numbness and tingling in her feet. She comes to the visit today with her granddaughter, Ronalee Belts no active disease. She also brings her home blood sugar readings in for review and these were good they will be scanned into the record. She has had recent lab work done and these labs were reviewed with the patient during the visit.        Patient Active Problem List   Diagnosis Date Noted  . Type II or unspecified type diabetes mellitus without mention of complication, not stated as uncontrolled 08/29/2013  . Hypertension 11/13/2012  . Cataracts, bilateral 11/13/2012  . Symptomatic menopausal or female climacteric states   . Hyperlipidemia   . Esophagitis   . 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 Obesity, mild   . IBS (irritable bowel syndrome)   . Kidney stone   . Lumbosacral spondylosis without myelopathy   . Pain in joint, shoulder region   . Colon polyp   . Diverticulosis    Outpatient Encounter Prescriptions as of 04/19/2014  Medication Sig  . ACCU-CHEK AVIVA PLUS test strip   . aspirin 81 MG EC tablet Take 81 mg by mouth daily.    . Cholecalciferol (VITAMIN D3) 2000 UNITS TABS Take 1 tablet by mouth daily.    Marland Kitchen ezetimibe (ZETIA) 10 MG tablet Take 1 tablet (10 mg total) by mouth daily.  . ferrous sulfate 325 (65 FE) MG EC tablet Take 325 mg by mouth daily with breakfast.    . Garlic 355 MG TABS Take 1 tablet by mouth 2 (two) times daily.   Marland Kitchen geriatric multivitamins-minerals (ELDERTONIC/GEVRABON) ELIX Take 15 mLs by mouth daily.    . metFORMIN (GLUCOPHAGE) 500 MG tablet TAKE 1 TABLET BY MOUTH TWICE  DAILY WITH A MEAL  . Omega-3 Fatty Acids (FISH OIL) 1000 MG CAPS Take 2 capsules by mouth daily.   Marland Kitchen omeprazole (PRILOSEC) 20 MG capsule Take 20 mg by mouth  daily.    . potassium chloride SA (K-DUR,KLOR-CON) 20 MEQ tablet TAKE ONE TABLET BY MOUTH ONE TIME DAILY  . pravastatin (PRAVACHOL) 80 MG tablet Take 1 tablet (80 mg total) by mouth daily.  Marland Kitchen triamterene-hydrochlorothiazide (MAXZIDE-25) 37.5-25 MG per tablet TAKE ONE TABLET BY MOUTH ONE TIME DAILY  . [DISCONTINUED] clorazepate (TRANXENE) 3.75 MG tablet Take 1 tablet (3.75 mg total) by mouth 2 (two) times daily as needed for anxiety.    Review of Systems  Constitutional: Negative.   HENT: Negative.   Eyes: Negative.   Respiratory: Negative.   Cardiovascular: Negative.   Gastrointestinal: Negative.   Endocrine: Negative.   Genitourinary: Negative.   Musculoskeletal: Positive for myalgias (feet tingeling and numbness).  Skin: Negative.   Allergic/Immunologic: Negative.   Neurological: Negative.   Hematological: Negative.   Psychiatric/Behavioral: Negative.        Objective:   Physical Exam  Nursing note and vitals reviewed. Constitutional: She is oriented to person, place, and time. She appears well-developed and well-nourished. No distress.  The patient comes to the visit today and her spirits and alert  HENT:  Head: Normocephalic and atraumatic.  Right Ear: External ear normal.  Left Ear: External ear normal.  Nose: Nose normal.  Mouth/Throat: Oropharynx is clear and moist. No oropharyngeal exudate.  Eyes:  Conjunctivae and EOM are normal. Pupils are equal, round, and reactive to light. Right eye exhibits no discharge. Left eye exhibits no discharge. No scleral icterus.  Neck: Normal range of motion. Neck supple. No thyromegaly present.  Cardiovascular: Normal rate, regular rhythm, normal heart sounds and intact distal pulses.  Exam reveals no gallop and no friction rub.   No murmur heard. The rhythm is regular at 84 per minute  Pulmonary/Chest: Effort normal and breath sounds normal. No respiratory distress. She has no wheezes. She has no rales. She exhibits no tenderness.    Abdominal: Soft. Bowel sounds are normal. She exhibits no mass. There is no tenderness. There is no rebound and no guarding.  Musculoskeletal: Normal range of motion. She exhibits no edema and no tenderness.  Lymphadenopathy:    She has no cervical adenopathy.  Neurological: She is alert and oriented to person, place, and time. She has normal reflexes. No cranial nerve deficit.  The patient has bunions on both feet and hammertoes bilaterally  Skin: Skin is warm and dry. No rash noted.  Psychiatric: She has a normal mood and affect. Her behavior is normal. Judgment and thought content normal.   BP 128/72  Pulse 85  Temp(Src) 96.6 F (35.9 C) (Oral)  Ht 5' (1.524 m)  Wt 146 lb (66.225 kg)  BMI 28.51 kg/m2  [facility] reading (PRIMARY) by  Dr. Brunilda Payor x-ray-  no active disease                                      Assessment & Plan:  1. Hyperlipidemia  2. Essential hypertension - DG Chest 2 View; Future  3. Type II or unspecified type diabetes mellitus without mention of complication, not stated as uncontrolled  4. Diabetic autonomic neuropathy associated with type 2 diabetes mellitus  No orders of the defined types were placed in this encounter.   Patient Instructions                       Medicare Annual Wellness Visit  [facility] and the medical providers at [facility] strive to bring you the best medical care.  In doing so we not only want to address your current medical conditions and concerns but also to detect new conditions early and prevent illness, disease and health-related problems.    Medicare offers a yearly Wellness Visit which allows our clinical staff to assess your need for preventative services including immunizations, lifestyle education, counseling to decrease risk of preventable diseases and screening for fall risk and other medical concerns.    This visit is provided free of charge (no copay) for all Medicare recipients. The  clinical pharmacists at [facility] have begun to conduct these Wellness Visits which will also include a thorough review of all your medications.    As you primary medical provider recommend that you make an appointment for your Annual Wellness Visit if you have not done so already this year.  You may set up this appointment before you leave today or you may call back ([phone]) and schedule an appointment.  Please make sure when you call that you mention that you are scheduling your Annual Wellness Visit with the clinical pharmacist so that the appointment may be made for the proper length of time.      Continue current medications. Continue good therapeutic lifestyle changes which include good  diet and exercise. Fall precautions discussed with patient. If an FOBT was given today- please return it to our front desk. If you are over 13 years old - you may need Prevnar 32 or the adult Pneumonia vaccine.  The patient's blood pressure is under good control today. She is unable to take ACE inhibitors because they cause a cough It is important for her to keep her feet and toes monitored and to keep her nails trimmed regularly by a professional people If the numbness and tingling continue we will consider trying gabapentin in the feet We will call you the results of a chest x-ray once those results are unavailable Continue to check your blood sugars regularly   Arrie Senate MD

## 2014-04-20 ENCOUNTER — Telehealth: Payer: Self-pay

## 2014-04-20 NOTE — Telephone Encounter (Signed)
Pt aware of CXR results-normal

## 2014-04-20 NOTE — Telephone Encounter (Signed)
Message copied by Koren Bound on Thu Apr 20, 2014  8:33 AM ------      Message from: [name]      Created: Wed Apr 19, 2014  5:26 PM       As per radiology report ------

## 2014-04-21 ENCOUNTER — Ambulatory Visit: Payer: Medicare Other | Admitting: Family Medicine

## 2014-04-24 ENCOUNTER — Other Ambulatory Visit: Payer: Self-pay | Admitting: Family Medicine

## 2014-06-24 ENCOUNTER — Other Ambulatory Visit: Payer: Self-pay | Admitting: Family Medicine

## 2014-06-26 NOTE — Telephone Encounter (Signed)
Last potassium 4.1 on 04/10/14.

## 2014-06-28 ENCOUNTER — Other Ambulatory Visit: Payer: Self-pay | Admitting: Family Medicine

## 2014-07-05 ENCOUNTER — Ambulatory Visit (INDEPENDENT_AMBULATORY_CARE_PROVIDER_SITE_OTHER): Payer: Medicare Other

## 2014-07-05 DIAGNOSIS — Z23 Encounter for immunization: Secondary | ICD-10-CM

## 2014-07-27 ENCOUNTER — Other Ambulatory Visit (INDEPENDENT_AMBULATORY_CARE_PROVIDER_SITE_OTHER): Payer: Medicare Other

## 2014-07-27 ENCOUNTER — Other Ambulatory Visit: Payer: Self-pay | Admitting: Family Medicine

## 2014-07-27 DIAGNOSIS — I10 Essential (primary) hypertension: Secondary | ICD-10-CM

## 2014-07-27 DIAGNOSIS — E559 Vitamin D deficiency, unspecified: Secondary | ICD-10-CM

## 2014-07-27 DIAGNOSIS — E785 Hyperlipidemia, unspecified: Secondary | ICD-10-CM

## 2014-07-27 DIAGNOSIS — E118 Type 2 diabetes mellitus with unspecified complications: Secondary | ICD-10-CM

## 2014-07-27 LAB — POCT CBC
Granulocyte percent: 60 %G (ref 37–80)
HEMATOCRIT: 36.5 % — AB (ref 37.7–47.9)
HEMOGLOBIN: 12.2 g/dL (ref 12.2–16.2)
Lymph, poc: 2.2 (ref 0.6–3.4)
MCH: 30.1 pg (ref 27–31.2)
MCHC: 33.4 g/dL (ref 31.8–35.4)
MCV: 90.2 fL (ref 80–97)
MPV: 7.9 fL (ref 0–99.8)
POC Granulocyte: 4 (ref 2–6.9)
POC LYMPH %: 33.8 % (ref 10–50)
Platelet Count, POC: 348 10*3/uL (ref 142–424)
RBC: 4.1 M/uL (ref 4.04–5.48)
RDW, POC: 12.4 %
WBC: 6.6 10*3/uL (ref 4.6–10.2)

## 2014-07-27 LAB — POCT GLYCOSYLATED HEMOGLOBIN (HGB A1C): Hemoglobin A1C: 5.6

## 2014-07-28 ENCOUNTER — Other Ambulatory Visit: Payer: Self-pay

## 2014-07-28 LAB — HEPATIC FUNCTION PANEL
ALK PHOS: 76 IU/L (ref 39–117)
ALT: 17 IU/L (ref 0–32)
AST: 15 IU/L (ref 0–40)
Albumin: 4.5 g/dL (ref 3.5–4.8)
BILIRUBIN DIRECT: 0.16 mg/dL (ref 0.00–0.40)
BILIRUBIN TOTAL: 0.5 mg/dL (ref 0.0–1.2)
Total Protein: 6.7 g/dL (ref 6.0–8.5)

## 2014-07-28 LAB — BMP8+EGFR
BUN/Creatinine Ratio: 13 (ref 11–26)
BUN: 10 mg/dL (ref 8–27)
CO2: 25 mmol/L (ref 18–29)
CREATININE: 0.78 mg/dL (ref 0.57–1.00)
Calcium: 10.6 mg/dL — ABNORMAL HIGH (ref 8.7–10.3)
Chloride: 92 mmol/L — ABNORMAL LOW (ref 97–108)
GFR, EST AFRICAN AMERICAN: 84 mL/min/{1.73_m2} (ref >59)
GFR, EST NON AFRICAN AMERICAN: 73 mL/min/{1.73_m2} (ref >59)
Glucose: 160 mg/dL — ABNORMAL HIGH (ref 65–99)
Potassium: 3.8 mmol/L (ref 3.5–5.2)
SODIUM: 133 mmol/L — AB (ref 134–144)

## 2014-07-28 LAB — NMR, LIPOPROFILE
CHOLESTEROL: 137 mg/dL (ref 100–199)
HDL Cholesterol by NMR: 54 mg/dL (ref >39)
HDL Particle Number: 39.7 umol/L (ref >=30.5)
LDL PARTICLE NUMBER: 796 nmol/L (ref <1000)
LDL SIZE: 20 nm (ref >20.5)
LDLC SERPL CALC-MCNC: 50 mg/dL (ref 0–99)
LP-IR SCORE: 53 — AB (ref <=45)
SMALL LDL PARTICLE NUMBER: 544 nmol/L — AB (ref <=527)
TRIGLYCERIDES BY NMR: 167 mg/dL — AB (ref 0–149)

## 2014-07-28 LAB — VITAMIN D 25 HYDROXY (VIT D DEFICIENCY, FRACTURES): Vit D, 25-Hydroxy: 49.6 ng/mL (ref 30.0–100.0)

## 2014-07-28 MED ORDER — GLUCOSE BLOOD VI STRP: ORAL_STRIP | Status: DC

## 2014-08-17 ENCOUNTER — Ambulatory Visit (INDEPENDENT_AMBULATORY_CARE_PROVIDER_SITE_OTHER): Payer: Medicare Other | Admitting: Family Medicine

## 2014-08-17 ENCOUNTER — Encounter: Payer: Self-pay | Admitting: Family Medicine

## 2014-08-17 VITALS — BP 130/78 | HR 86 | Temp 96.4°F | Ht 60.0 in | Wt 144.0 lb

## 2014-08-17 DIAGNOSIS — G629 Polyneuropathy, unspecified: Secondary | ICD-10-CM

## 2014-08-17 DIAGNOSIS — E1143 Type 2 diabetes mellitus with diabetic autonomic (poly)neuropathy: Secondary | ICD-10-CM

## 2014-08-17 DIAGNOSIS — E118 Type 2 diabetes mellitus with unspecified complications: Secondary | ICD-10-CM

## 2014-08-17 DIAGNOSIS — I10 Essential (primary) hypertension: Secondary | ICD-10-CM

## 2014-08-17 DIAGNOSIS — E785 Hyperlipidemia, unspecified: Secondary | ICD-10-CM

## 2014-08-17 DIAGNOSIS — E559 Vitamin D deficiency, unspecified: Secondary | ICD-10-CM

## 2014-08-17 DIAGNOSIS — E1142 Type 2 diabetes mellitus with diabetic polyneuropathy: Secondary | ICD-10-CM

## 2014-08-17 LAB — POCT UA - MICROALBUMIN: MICROALBUMIN (UR) POC: 50 mg/L

## 2014-08-17 MED ORDER — GABAPENTIN 100 MG PO CAPS: ORAL_CAPSULE | ORAL | Status: DC

## 2014-08-17 NOTE — Patient Instructions (Addendum)
Medicare Annual Wellness Visit  [facility] and the medical providers at [facility] strive to bring you the best medical care.  In doing so we not only want to address your current medical conditions and concerns but also to detect new conditions early and prevent illness, disease and health-related problems.    Medicare offers a yearly Wellness Visit which allows our clinical staff to assess your need for preventative services including immunizations, lifestyle education, counseling to decrease risk of preventable diseases and screening for fall risk and other medical concerns.    This visit is provided free of charge (no copay) for all Medicare recipients. The clinical pharmacists at [facility] have begun to conduct these Wellness Visits which will also include a thorough review of all your medications.    As you primary medical provider recommend that you make an appointment for your Annual Wellness Visit if you have not done so already this year.  You may set up this appointment before you leave today or you may call back [phone]) and schedule an appointment.  Please make sure when you call that you mention that you are scheduling your Annual Wellness Visit with the clinical pharmacist so that the appointment may be made for the proper length of time.     Continue current medications. Continue good therapeutic lifestyle changes which include good diet and exercise. Fall precautions discussed with patient. If an FOBT was given today- please return it to our front desk. If you are over 51 years old - you may need Prevnar 28 or the adult Pneumonia vaccine.  Flu Shots will be available at our office starting mid- September. Please call and schedule a FLU CLINIC APPOINTMENT.   Continue to monitor blood sugars regularly and blood pressures if possible. Continue to monitor your feet Return the FOBT If the right shoulder  continues to give you problems, please call back and we will inject the joint that is hurting they are. Start the gabapentin as directed take 1 nightly for 7 nights if there is no relief in your foot discomfort increased to 2 nightly for 7 nights if there is no relief to the foot discomfort increased to 3 nightly for 7 nights and stay on that dose

## 2014-08-17 NOTE — Addendum Note (Signed)
Addended by: Selmer Dominion on: 08/17/2014 10:43 AM   Modules accepted: Orders

## 2014-08-17 NOTE — Progress Notes (Signed)
Subjective:    Patient ID: Theresa Watson, female    DOB: 1932-12-26, 80 y.o.   MRN: [id number]  HPI Pt here for follow up and management of chronic medical problems. The patient complains of neuropathy in both feet and pain in her right shoulder. Recent lab work will be reviewed with her today. She brings in blood sugars for review from the past several months and these will be scanned into her record. The blood sugar readings are good overall and there'll be no change in her medication based on these readings.she normally comes with her family member but that particular person is having knee surgery today. The patient's recent lab work was reviewed with her during the visit today.        Patient Active Problem List   Diagnosis Date Noted  . Type II or unspecified type diabetes mellitus without mention of complication, not stated as uncontrolled 08/29/2013  . Hypertension 11/13/2012  . Cataracts, bilateral 11/13/2012  . Symptomatic menopausal or female climacteric states   . Hyperlipidemia   . Esophagitis   . 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 Obesity, mild   . IBS (irritable bowel syndrome)   . Kidney stone   . Lumbosacral spondylosis without myelopathy   . Pain in joint, shoulder region   . Colon polyp   . Diverticulosis    Outpatient Encounter Prescriptions as of 08/17/2014  Medication Sig  . aspirin 81 MG EC tablet Take 81 mg by mouth daily.    . Cholecalciferol (VITAMIN D3) 2000 UNITS TABS Take 1 tablet by mouth daily.    Marland Kitchen ezetimibe (ZETIA) 10 MG tablet Take 1 tablet (10 mg total) by mouth daily.  . ferrous sulfate 325 (65 FE) MG EC tablet Take 325 mg by mouth daily with breakfast.    . Garlic 601 MG TABS Take 1 tablet by mouth 2 (two) times daily.   Marland Kitchen geriatric multivitamins-minerals (ELDERTONIC/GEVRABON) ELIX Take 15 mLs by mouth daily.    Marland Kitchen glucose blood (ACCU-CHEK AVIVA PLUS) test strip Test twice daily     E11.43  . metFORMIN  (GLUCOPHAGE) 500 MG tablet TAKE ONE TABLET BY MOUTH TWICE A DAY WITH MEALS  . Omega-3 Fatty Acids (FISH OIL) 1000 MG CAPS Take 2 capsules by mouth daily.   Marland Kitchen omeprazole (PRILOSEC) 20 MG capsule Take 20 mg by mouth daily.    . potassium chloride SA (K-DUR,KLOR-CON) 20 MEQ tablet TAKE ONE TABLET BY MOUTH ONE  TIME DAILY  . pravastatin (PRAVACHOL) 80 MG tablet TAKE ONE TABLET BY MOUTH ONE TIME DAILY  . triamterene-hydrochlorothiazide (MAXZIDE-25) 37.5-25 MG per tablet TAKE ONE TABLET BY MOUTH ONE  TIME DAILY    Review of Systems  Constitutional: Negative.   HENT: Negative.   Eyes: Negative.   Respiratory: Negative.   Cardiovascular: Negative.   Gastrointestinal: Negative.   Endocrine: Negative.   Genitourinary: Negative.   Musculoskeletal: Positive for arthralgias (right shoulder soreness).       Neuropathy in bilateral feet  Skin: Negative.   Allergic/Immunologic: Negative.   Neurological: Negative.   Hematological: Negative.   Psychiatric/Behavioral: Negative.        Objective:   Physical Exam  Constitutional: She is oriented to person, place, and time. She appears well-developed and well-nourished. No distress.  The patient was alert and cooperative and looks younger than her stated age.  HENT:  Head: Normocephalic and atraumatic.  Right Ear: External ear normal.  Left Ear: External ear normal.  Nose: Nose normal.  Mouth/Throat: Oropharynx is clear and moist.  Eyes: Conjunctivae and EOM are normal. Pupils are equal, round, and reactive to light. Right eye exhibits no discharge. Left eye exhibits no discharge. No scleral icterus.  Neck: Normal range of motion. Neck supple. No thyromegaly present.  There were no carotid bruits  Cardiovascular: Normal rate, regular rhythm, normal heart sounds and intact distal pulses.  Exam reveals no gallop and no friction rub.   No murmur heard. The rhythm was regular at 72/m  Pulmonary/Chest: Effort normal and breath sounds normal. No  respiratory distress. She has no wheezes. She has no rales. She exhibits no tenderness.  The lungs were clear anteriorly and posteriorly  Abdominal: Soft. Bowel sounds are normal. She exhibits no mass. There is no tenderness. There is no rebound and no guarding.  There was no abdominal tenderness or masses palpated.  Musculoskeletal: Normal range of motion. She exhibits no edema.  There were bunions of both feet  Lymphadenopathy:    She has no cervical adenopathy.  Neurological: She is alert and oriented to person, place, and time. She has normal reflexes. No cranial nerve deficit.  Skin: Skin is warm and dry. No rash noted. No erythema.  Psychiatric: She has a normal mood and affect. Her behavior is normal. Judgment and thought content normal.  Nursing note and vitals reviewed.  BP 130/78 mmHg  Pulse 86  Temp(Src) 96.4 F (35.8 C) (Oral)  Ht 5' (1.524 m)  Wt 144 lb (65.318 kg)  BMI 28.12 kg/m2        Assessment & Plan:  1. Essential hypertension - POCT UA - Microalbumin - Calcium  2. Diabetic autonomic neuropathy associated with type 2 diabetes mellitus  3. Vitamin D deficiency  4. Type 2 diabetes mellitus with complication - POCT UA - Microalbumin - gabapentin (NEURONTIN) 100 MG capsule; 1 nightly for 7 nights, then 2 nightly, then 3 nightly as directed  Dispense: 90 capsule; Refill: 3  5. Hyperlipidemia - Calcium  6. Diabetic peripheral neuropathy associated with type 2 diabetes mellitus - gabapentin (NEURONTIN) 100 MG capsule; 1 nightly for 7 nights, then 2 nightly, then 3 nightly as directed  Dispense: 90 capsule; Refill: 3  7. Hypercalcemia -repeat BMP today  Meds ordered this encounter  Medications  . gabapentin (NEURONTIN) 100 MG capsule    Sig: 1 nightly for 7 nights, then 2 nightly, then 3 nightly as directed    Dispense:  90 capsule    Refill:  3   Patient Instructions                       Medicare Annual Wellness Visit  [facility] and the medical  providers at [facility] strive to bring you the best medical care.  In doing so we not only want to address your current medical conditions and concerns but also to detect new conditions early and prevent illness, disease and health-related problems.    Medicare offers a yearly Wellness Visit which allows our clinical staff to assess your need for preventative services including immunizations, lifestyle education, counseling to decrease risk of preventable diseases and screening for fall risk and other medical concerns.    This visit is provided free of charge (no copay) for all Medicare recipients. The clinical pharmacists at [facility] have begun to conduct these Wellness Visits which will also include a thorough review of all your medications.    As you primary medical  provider recommend that you make an appointment for your Annual Wellness Visit if you have not done so already this year.  You may set up this appointment before you leave today or you may call back ([phone]) and schedule an appointment.  Please make sure when you call that you mention that you are scheduling your Annual Wellness Visit with the clinical pharmacist so that the appointment may be made for the proper length of time.     Continue current medications. Continue good therapeutic lifestyle changes which include good diet and exercise. Fall precautions discussed with patient. If an FOBT was given today- please return it to our front desk. If you are over 27 years old - you may need Prevnar 60 or the adult Pneumonia vaccine.  Flu Shots will be available at our office starting mid- September. Please call and schedule a FLU CLINIC APPOINTMENT.   Continue to monitor blood sugars regularly and blood pressures if possible. Continue to monitor your feet Return the FOBT If the right shoulder continues to give you problems, please call back and we will inject the joint that is  hurting they are. Start the gabapentin as directed take 1 nightly for 7 nights if there is no relief in your foot discomfort increased to 2 nightly for 7 nights if there is no relief to the foot discomfort increased to 3 nightly for 7 nights and stay on that dose   Arrie Senate MD

## 2014-08-18 LAB — CALCIUM: Calcium: 10.3 mg/dL (ref 8.7–10.3)

## 2014-08-18 LAB — MICROALBUMIN, URINE: MICROALBUM., U, RANDOM: 33 ug/mL — AB (ref 0.0–17.0)

## 2014-08-22 ENCOUNTER — Other Ambulatory Visit: Payer: Self-pay | Admitting: Nurse Practitioner

## 2014-09-18 ENCOUNTER — Other Ambulatory Visit: Payer: Medicare Other

## 2014-09-18 DIAGNOSIS — Z1212 Encounter for screening for malignant neoplasm of rectum: Secondary | ICD-10-CM

## 2014-09-18 NOTE — Progress Notes (Signed)
Lab only 

## 2014-09-20 LAB — FECAL OCCULT BLOOD, IMMUNOCHEMICAL: Fecal Occult Bld: NEGATIVE

## 2014-10-05 ENCOUNTER — Encounter: Payer: Self-pay | Admitting: *Deleted

## 2014-10-22 ENCOUNTER — Other Ambulatory Visit: Payer: Self-pay | Admitting: Family Medicine

## 2014-10-26 ENCOUNTER — Other Ambulatory Visit: Payer: Self-pay | Admitting: Nurse Practitioner

## 2014-11-13 DIAGNOSIS — E119 Type 2 diabetes mellitus without complications: Secondary | ICD-10-CM | POA: Diagnosis not present

## 2014-11-13 DIAGNOSIS — H40033 Anatomical narrow angle, bilateral: Secondary | ICD-10-CM | POA: Diagnosis not present

## 2014-11-13 LAB — HM DIABETES EYE EXAM

## 2014-11-25 ENCOUNTER — Other Ambulatory Visit: Payer: Self-pay | Admitting: Family Medicine

## 2014-12-22 ENCOUNTER — Other Ambulatory Visit: Payer: Self-pay | Admitting: Family Medicine

## 2014-12-25 ENCOUNTER — Other Ambulatory Visit: Payer: Medicare Other

## 2014-12-25 DIAGNOSIS — I10 Essential (primary) hypertension: Secondary | ICD-10-CM

## 2014-12-25 DIAGNOSIS — E785 Hyperlipidemia, unspecified: Secondary | ICD-10-CM | POA: Diagnosis not present

## 2014-12-25 DIAGNOSIS — E1143 Type 2 diabetes mellitus with diabetic autonomic (poly)neuropathy: Secondary | ICD-10-CM

## 2014-12-25 DIAGNOSIS — E118 Type 2 diabetes mellitus with unspecified complications: Secondary | ICD-10-CM

## 2014-12-25 DIAGNOSIS — E1140 Type 2 diabetes mellitus with diabetic neuropathy, unspecified: Secondary | ICD-10-CM | POA: Diagnosis not present

## 2014-12-25 DIAGNOSIS — K219 Gastro-esophageal reflux disease without esophagitis: Secondary | ICD-10-CM

## 2014-12-25 DIAGNOSIS — E559 Vitamin D deficiency, unspecified: Secondary | ICD-10-CM | POA: Diagnosis not present

## 2014-12-26 LAB — CBC WITH DIFFERENTIAL
Basophils Absolute: 0 x10E3/uL (ref 0.0–0.2)
Basos: 1 %
Eos: 3 %
Eosinophils Absolute: 0.1 x10E3/uL (ref 0.0–0.4)
HCT: 38.8 % (ref 34.0–46.6)
Hemoglobin: 12.9 g/dL (ref 11.1–15.9)
Immature Grans (Abs): 0 x10E3/uL (ref 0.0–0.1)
Immature Granulocytes: 0 %
Lymphocytes Absolute: 2.6 x10E3/uL (ref 0.7–3.1)
Lymphs: 45 %
MCH: 30.4 pg (ref 26.6–33.0)
MCHC: 33.2 g/dL (ref 31.5–35.7)
MCV: 92 fL (ref 79–97)
Monocytes Absolute: 0.5 x10E3/uL (ref 0.1–0.9)
Monocytes: 9 %
Neutrophils Absolute: 2.4 x10E3/uL (ref 1.4–7.0)
Neutrophils Relative %: 42 %
RBC: 4.24 x10E6/uL (ref 3.77–5.28)
RDW: 12.6 % (ref 12.3–15.4)
WBC: 5.7 x10E3/uL (ref 3.4–10.8)

## 2014-12-26 LAB — NMR, LIPOPROFILE
CHOLESTEROL: 139 mg/dL (ref 100–199)
HDL Cholesterol by NMR: 56 mg/dL (ref >39)
HDL Particle Number: 42.4 umol/L (ref >=30.5)
LDL PARTICLE NUMBER: 774 nmol/L (ref <1000)
LDL SIZE: 20.2 nm (ref >20.5)
LDL-C: 55 mg/dL (ref 0–99)
LP-IR SCORE: 44 (ref <=45)
Small LDL Particle Number: 441 nmol/L (ref <=527)
Triglycerides by NMR: 138 mg/dL (ref 0–149)

## 2014-12-26 LAB — HEPATIC FUNCTION PANEL
ALT: 22 IU/L (ref 0–32)
AST: 20 IU/L (ref 0–40)
Albumin: 4.7 g/dL (ref 3.5–4.8)
Alkaline Phosphatase: 72 IU/L (ref 39–117)
Bilirubin Total: 0.5 mg/dL (ref 0.0–1.2)
Bilirubin, Direct: 0.17 mg/dL (ref 0.00–0.40)
Total Protein: 7.1 g/dL (ref 6.0–8.5)

## 2014-12-26 LAB — BMP8+EGFR
BUN/Creatinine Ratio: 17 (ref 11–26)
BUN: 13 mg/dL (ref 8–27)
CHLORIDE: 95 mmol/L — AB (ref 97–108)
CO2: 26 mmol/L (ref 18–29)
CREATININE: 0.78 mg/dL (ref 0.57–1.00)
Calcium: 10.6 mg/dL — ABNORMAL HIGH (ref 8.7–10.3)
GFR calc Af Amer: 84 mL/min/{1.73_m2} (ref >59)
GFR, EST NON AFRICAN AMERICAN: 73 mL/min/{1.73_m2} (ref >59)
Glucose: 109 mg/dL — ABNORMAL HIGH (ref 65–99)
Potassium: 4.2 mmol/L (ref 3.5–5.2)
SODIUM: 137 mmol/L (ref 134–144)

## 2014-12-26 LAB — HEMOGLOBIN A1C
Est. average glucose Bld gHb Est-mCnc: 117 mg/dL
HEMOGLOBIN A1C: 5.7 % — AB (ref 4.8–5.6)

## 2014-12-26 LAB — VITAMIN D 25 HYDROXY (VIT D DEFICIENCY, FRACTURES): Vit D, 25-Hydroxy: 61.8 ng/mL (ref 30.0–100.0)

## 2015-01-02 ENCOUNTER — Ambulatory Visit (INDEPENDENT_AMBULATORY_CARE_PROVIDER_SITE_OTHER): Payer: Medicare Other | Admitting: Family Medicine

## 2015-01-02 ENCOUNTER — Encounter (INDEPENDENT_AMBULATORY_CARE_PROVIDER_SITE_OTHER): Payer: Self-pay

## 2015-01-02 ENCOUNTER — Encounter: Payer: Self-pay | Admitting: Family Medicine

## 2015-01-02 VITALS — BP 131/72 | HR 75 | Temp 97.5°F | Ht 60.0 in | Wt 144.0 lb

## 2015-01-02 DIAGNOSIS — E1143 Type 2 diabetes mellitus with diabetic autonomic (poly)neuropathy: Secondary | ICD-10-CM

## 2015-01-02 DIAGNOSIS — E559 Vitamin D deficiency, unspecified: Secondary | ICD-10-CM

## 2015-01-02 DIAGNOSIS — H6122 Impacted cerumen, left ear: Secondary | ICD-10-CM | POA: Diagnosis not present

## 2015-01-02 DIAGNOSIS — I10 Essential (primary) hypertension: Secondary | ICD-10-CM | POA: Diagnosis not present

## 2015-01-02 DIAGNOSIS — E785 Hyperlipidemia, unspecified: Secondary | ICD-10-CM

## 2015-01-02 NOTE — Progress Notes (Signed)
Subjective:    Patient ID: Theresa Watson, female    DOB: Mar 01, 1933, 81 y.o.   MRN: [id number]  HPI Pt here for follow up and management of chronic medical problems which includes diabetes, hyperlipidemia, and hypertension. She is taking medications regularly. The patient has had recent blood work done and this will be reviewed with her today. Her CBC was good her hemoglobin A1c was good at 5.7%. The creatinine was within normal limits and all liver function tests were normal. All cholesterol numbers were excellent and at goal. Also her vitamin D level was good and she will continue with her current vitamin D.         Patient Active Problem List   Diagnosis Date Noted  . Type 2 diabetes mellitus with neurological manifestations, controlled 08/29/2013  . Hypertension 11/13/2012  . Cataracts, bilateral 11/13/2012  . Symptomatic menopausal or female climacteric states   . Hyperlipidemia   . Esophagitis   . 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 Obesity, mild   . IBS (irritable bowel syndrome)   . Kidney stone   . Lumbosacral spondylosis without myelopathy   . Pain in joint, shoulder region   . Colon polyp   . Diverticulosis    Outpatient Encounter Prescriptions as of 01/02/2015  Medication Sig  . aspirin 81 MG EC tablet Take 81 mg by mouth daily.    . Cholecalciferol (VITAMIN D3) 2000 UNITS TABS Take 1 tablet by mouth daily.    Marland Kitchen ezetimibe (ZETIA) 10 MG tablet Take 1 tablet (10 mg total) by mouth daily.  . ferrous sulfate 325 (65 FE) MG EC tablet Take 325 mg by mouth daily with breakfast.    . gabapentin (NEURONTIN) 100 MG capsule TAKE 1 CAPSULE AT NIGHT FOR 7 NIGHTS, 2 AT NIGHT FOR 7 NIGHTS, THEN 3 CAPSULES NIGHTLY THEREAFTER  . Garlic 510 MG TABS Take 1 tablet by mouth 2 (two) times daily.   Marland Kitchen geriatric multivitamins-minerals (ELDERTONIC/GEVRABON) ELIX Take 15 mLs by mouth daily.    Marland Kitchen glucose blood (ACCU-CHEK AVIVA PLUS) test strip Test twice daily      E11.43  . metFORMIN (GLUCOPHAGE) 500 MG tablet TAKE ONE TABLET BY MOUTH TWICE A DAY WITH MEALS  . Omega-3 Fatty Acids (FISH OIL) 1000 MG CAPS Take 2 capsules by mouth daily.   Marland Kitchen omeprazole (PRILOSEC) 20 MG capsule Take 20 mg by mouth daily.    . potassium chloride SA (K-DUR,KLOR-CON) 20 MEQ tablet TAKE ONE TABLET BY MOUTH ONE  TIME DAILY  . pravastatin (PRAVACHOL) 80 MG tablet TAKE ONE TABLET BY MOUTH ONE TIME DAILY  . triamterene-hydrochlorothiazide (MAXZIDE-25) 37.5-25 MG per tablet TAKE ONE TABLET BY MOUTH ONE TIME DAILY    Review of Systems  Constitutional: Negative.   HENT: Negative.   Eyes: Negative.   Respiratory: Negative.   Cardiovascular: Negative.   Gastrointestinal: Negative.   Endocrine: Negative.   Genitourinary: Negative.   Musculoskeletal: Negative.   Skin: Negative.   Allergic/Immunologic: Negative.   Neurological: Positive for numbness (bilateral feet numbness).  Hematological: Negative.   Psychiatric/Behavioral: Negative.        Objective:   Physical Exam  Constitutional: She is oriented to person, place, and time. She appears well-developed and well-nourished.  HENT:  Head: Normocephalic and atraumatic.  Right Ear: External ear normal.  Left Ear: External ear normal.  Nose: Nose normal.  Mouth/Throat: Oropharynx is clear and moist.  There is is cerumen in the left ear canal and  this was irrigated by the nurse without problems  Eyes: Conjunctivae and EOM are normal. Pupils are equal, round, and reactive to light. Right eye exhibits no discharge. Left eye exhibits no discharge. No scleral icterus.  Neck: Normal range of motion. Neck supple. No thyromegaly present.  Cardiovascular: Normal rate, regular rhythm, normal heart sounds and intact distal pulses.  Exam reveals no gallop and no friction rub.   No murmur heard. At 72/m  Pulmonary/Chest: Effort normal and breath sounds normal. No respiratory distress. She has no wheezes. She has no rales. She  exhibits no tenderness.  Abdominal: Soft. Bowel sounds are normal. She exhibits no mass. There is no tenderness. There is no rebound and no guarding.  Musculoskeletal: Normal range of motion. She exhibits no edema or tenderness.  Lymphadenopathy:    She has no cervical adenopathy.  Neurological: She is alert and oriented to person, place, and time. She has normal reflexes. No cranial nerve deficit.  Skin: Skin is warm and dry. No rash noted.  Psychiatric: She has a normal mood and affect. Her behavior is normal. Judgment and thought content normal.  Nursing note and vitals reviewed.  BP 131/72 mmHg  Pulse 75  Temp(Src) 97.5 F (36.4 C) (Oral)  Ht 5' (1.524 m)  Wt 144 lb (65.318 kg)  BMI 28.12 kg/m2        Assessment & Plan:  1. Essential hypertension -The blood pressure is under good control the patient should continue with her current treatment and sodium restriction  2. Diabetic autonomic neuropathy associated with type 2 diabetes mellitus -The diabetes is under excellent control but she still having problems with the neuropathy from the diabetes. She will change the gabapentin and take one at suppertime and 2 at bedtime  3. Vitamin D deficiency -The vitamin D levels were good and patient should continue with current treatment  4. Hyperlipidemia -All cholesterol numbers were excellent and the patient should continue with her current treatment and therapeutic lifestyle changes  5. Left ear impacted cerumen -The ear was irrigated to remove excess wax  6. Hypercalcemia -This was elevated on the blood work and we will repeat this as the patient is leaving the office today.  No orders of the defined types were placed in this encounter.   Patient Instructions                       Medicare Annual Wellness Visit  [facility] and the medical providers at [facility] strive to bring you the best medical care.  In doing so we not only want to address your  current medical conditions and concerns but also to detect new conditions early and prevent illness, disease and health-related problems.    Medicare offers a yearly Wellness Visit which allows our clinical staff to assess your need for preventative services including immunizations, lifestyle education, counseling to decrease risk of preventable diseases and screening for fall risk and other medical concerns.    This visit is provided free of charge (no copay) for all Medicare recipients. The clinical pharmacists at [facility] have begun to conduct these Wellness Visits which will also include a thorough review of all your medications.    As you primary medical provider recommend that you make an appointment for your Annual Wellness Visit if you have not done so already this year.  You may set up this appointment before you leave today or you may call back ([phone]) and schedule  an appointment.  Please make sure when you call that you mention that you are scheduling your Annual Wellness Visit with the clinical pharmacist so that the appointment may be made for the proper length of time.     Continue current medications. Continue good therapeutic lifestyle changes which include good diet and exercise. Fall precautions discussed with patient. If an FOBT was given today- please return it to our front desk. If you are over 99 years old - you may need Prevnar 56 or the adult Pneumonia vaccine.  Flu Shots are still available at our office. If you still haven't had one please call to set up a nurse visit to get one.   After your visit with [place] today you will receive a survey in the mail or online from [employer] regarding your care with [place]. Please take a moment to fill this out. Your feedback is very important to [place] as you can help [place] better understand your patient needs as well as improve your experience and satisfaction. WE CARE ABOUT YOU!!!   The patient should continue her  current medicines and as aggressive therapeutic lifestyle changes as possible. She needs a mammogram and a pelvic exam and should be scheduled for the pelvic exam with Shelah Lewandowsky Try taking the gabapentin 1 at suppertime and 2 at bedtime and see if this helps your evening neuropathy in your feet   Arrie Senate MD

## 2015-01-02 NOTE — Patient Instructions (Addendum)
Medicare Annual Wellness Visit  [facility] and the medical providers at [facility] strive to bring you the best medical care.  In doing so we not only want to address your current medical conditions and concerns but also to detect new conditions early and prevent illness, disease and health-related problems.    Medicare offers a yearly Wellness Visit which allows our clinical staff to assess your need for preventative services including immunizations, lifestyle education, counseling to decrease risk of preventable diseases and screening for fall risk and other medical concerns.    This visit is provided free of charge (no copay) for all Medicare recipients. The clinical pharmacists at [facility] have begun to conduct these Wellness Visits which will also include a thorough review of all your medications.    As you primary medical provider recommend that you make an appointment for your Annual Wellness Visit if you have not done so already this year.  You may set up this appointment before you leave today or you may call back ([phone]) and schedule an appointment.  Please make sure when you call that you mention that you are scheduling your Annual Wellness Visit with the clinical pharmacist so that the appointment may be made for the proper length of time.     Continue current medications. Continue good therapeutic lifestyle changes which include good diet and exercise. Fall precautions discussed with patient. If an FOBT was given today- please return it to our front desk. If you are over 31 years old - you may need Prevnar 38 or the adult Pneumonia vaccine.  Flu Shots are still available at our office. If you still haven't had one please call to set up a nurse visit to get one.   After your visit with [place] today you will receive a survey in the mail or online from [employer] regarding your care with [place]. Please take a moment to  fill this out. Your feedback is very important to [place] as you can help [place] better understand your patient needs as well as improve your experience and satisfaction. WE CARE ABOUT YOU!!!   The patient should continue her current medicines and as aggressive therapeutic lifestyle changes as possible. She needs a mammogram and a pelvic exam and should be scheduled for the pelvic exam with Shelah Lewandowsky Try taking the gabapentin 1 at suppertime and 2 at bedtime and see if this helps your evening neuropathy in your feet

## 2015-01-03 LAB — CALCIUM: Calcium: 10.4 mg/dL — ABNORMAL HIGH (ref 8.7–10.3)

## 2015-01-04 ENCOUNTER — Ambulatory Visit: Payer: Medicare Other | Admitting: Family Medicine

## 2015-01-23 ENCOUNTER — Other Ambulatory Visit: Payer: Self-pay | Admitting: Family Medicine

## 2015-01-27 ENCOUNTER — Other Ambulatory Visit: Payer: Self-pay | Admitting: Family Medicine

## 2015-01-29 ENCOUNTER — Other Ambulatory Visit (INDEPENDENT_AMBULATORY_CARE_PROVIDER_SITE_OTHER): Payer: Medicare Other

## 2015-01-29 DIAGNOSIS — Z1231 Encounter for screening mammogram for malignant neoplasm of breast: Secondary | ICD-10-CM | POA: Diagnosis not present

## 2015-01-29 DIAGNOSIS — E559 Vitamin D deficiency, unspecified: Secondary | ICD-10-CM | POA: Diagnosis not present

## 2015-01-29 NOTE — Progress Notes (Signed)
Lab only 

## 2015-01-30 LAB — CALCIUM: Calcium: 10.5 mg/dL — ABNORMAL HIGH (ref 8.7–10.3)

## 2015-01-31 ENCOUNTER — Other Ambulatory Visit (INDEPENDENT_AMBULATORY_CARE_PROVIDER_SITE_OTHER): Payer: Medicare Other

## 2015-01-31 DIAGNOSIS — I10 Essential (primary) hypertension: Secondary | ICD-10-CM

## 2015-01-31 NOTE — Progress Notes (Signed)
Lab only 

## 2015-02-01 LAB — SPECIMEN STATUS REPORT

## 2015-02-01 LAB — PARATHYROID HORMONE, INTACT (NO CA): PTH: 31 pg/mL (ref 15–65)

## 2015-02-01 LAB — VITAMIN D 25 HYDROXY (VIT D DEFICIENCY, FRACTURES): VIT D 25 HYDROXY: 53.6 ng/mL (ref 30.0–100.0)

## 2015-02-15 DIAGNOSIS — X32XXXD Exposure to sunlight, subsequent encounter: Secondary | ICD-10-CM | POA: Diagnosis not present

## 2015-02-15 DIAGNOSIS — L820 Inflamed seborrheic keratosis: Secondary | ICD-10-CM | POA: Diagnosis not present

## 2015-02-15 DIAGNOSIS — L570 Actinic keratosis: Secondary | ICD-10-CM | POA: Diagnosis not present

## 2015-02-23 ENCOUNTER — Encounter: Payer: Self-pay | Admitting: Family Medicine

## 2015-02-24 ENCOUNTER — Other Ambulatory Visit: Payer: Self-pay | Admitting: Family Medicine

## 2015-02-26 ENCOUNTER — Ambulatory Visit (INDEPENDENT_AMBULATORY_CARE_PROVIDER_SITE_OTHER): Payer: Medicare Other | Admitting: Nurse Practitioner

## 2015-02-26 ENCOUNTER — Encounter: Payer: Self-pay | Admitting: Nurse Practitioner

## 2015-02-26 DIAGNOSIS — Z0000 Encounter for general adult medical examination without abnormal findings: Secondary | ICD-10-CM | POA: Insufficient documentation

## 2015-02-26 DIAGNOSIS — Z01419 Encounter for gynecological examination (general) (routine) without abnormal findings: Secondary | ICD-10-CM | POA: Diagnosis not present

## 2015-02-26 LAB — POCT URINALYSIS DIPSTICK
Bilirubin, UA: NEGATIVE
Blood, UA: NEGATIVE
Glucose, UA: NEGATIVE
KETONES UA: NEGATIVE
LEUKOCYTES UA: NEGATIVE
Nitrite, UA: NEGATIVE
PROTEIN UA: NEGATIVE
Spec Grav, UA: 1.015
Urobilinogen, UA: NEGATIVE
pH, UA: 6.5

## 2015-02-26 LAB — POCT UA - MICROSCOPIC ONLY
BACTERIA, U MICROSCOPIC: NEGATIVE
Casts, Ur, LPF, POC: NEGATIVE
Crystals, Ur, HPF, POC: NEGATIVE
MUCUS UA: NEGATIVE
RBC, urine, microscopic: NEGATIVE
WBC, Ur, HPF, POC: NEGATIVE
Yeast, UA: NEGATIVE

## 2015-02-26 NOTE — Progress Notes (Addendum)
   Subjective:    Patient ID: Theresa Watson, female    DOB: 1933-02-07, 81 y.o.   MRN: [id number]  HPI Patient here today for annual physical exam and pap- She was seen for follow less than 2 months ago. No complaints today.    Review of Systems  Constitutional: Negative.   HENT: Negative.   Respiratory: Negative.   Cardiovascular: Negative.   Gastrointestinal: Negative.   Genitourinary: Negative.   Neurological: Negative.   Psychiatric/Behavioral: Negative.   All other systems reviewed and are negative.      Objective:   Physical Exam  Constitutional: She is oriented to person, place, and time. She appears well-developed and well-nourished.  HENT:  Head: Normocephalic.  Right Ear: Hearing, tympanic membrane, external ear and ear canal normal.  Left Ear: Hearing, tympanic membrane, external ear and ear canal normal.  Nose: Nose normal.  Mouth/Throat: Uvula is midline and oropharynx is clear and moist.  Eyes: Conjunctivae and EOM are normal. Pupils are equal, round, and reactive to light.  Neck: Normal range of motion and full passive range of motion without pain. Neck supple. No JVD present. Carotid bruit is not present. No thyroid mass and no thyromegaly present.  Cardiovascular: Normal rate, normal heart sounds and intact distal pulses.   No murmur heard. Pulmonary/Chest: Effort normal and breath sounds normal. Right breast exhibits no inverted nipple, no mass, no nipple discharge, no skin change and no tenderness. Left breast exhibits no inverted nipple, no mass, no nipple discharge, no skin change and no tenderness.  Abdominal: Soft. Bowel sounds are normal. She exhibits no mass. There is no tenderness.  Genitourinary: Vagina normal and uterus normal. No breast swelling, tenderness, discharge or bleeding.  bimanual exam-No adnexal masses or tenderness. Vaginal cuff intact Large cystocele  Musculoskeletal: Normal range of motion.  Lymphadenopathy:    She has no cervical  adenopathy.  Neurological: She is alert and oriented to person, place, and time.  Skin: Skin is warm and dry.  Psychiatric: She has a normal mood and affect. Her behavior is normal. Judgment and thought content normal.   BP 140/77 mmHg  Pulse 77  Ht 5' (1.524 m)  Wt 144 lb 9.6 oz (65.59 kg)  BMI 28.24 kg/m2        Assessment & Plan:  1. Encounter for routine gynecological examination - POCT UA - Microscopic Only - POCT urinalysis dipstick - Pap IG (Image Guided)  Keep follow up appointment with Dr. Renee Pain pending Health maintenance reviewed Diet and exercise encouraged Continue all meds Follow up  In 3 months   [place], FNP

## 2015-02-28 LAB — PAP IG (IMAGE GUIDED): PAP Smear Comment: 0

## 2015-03-09 ENCOUNTER — Encounter: Payer: Self-pay | Admitting: *Deleted

## 2015-03-24 ENCOUNTER — Other Ambulatory Visit: Payer: Self-pay | Admitting: Family Medicine

## 2015-04-16 ENCOUNTER — Other Ambulatory Visit: Payer: Self-pay | Admitting: *Deleted

## 2015-04-16 ENCOUNTER — Other Ambulatory Visit (INDEPENDENT_AMBULATORY_CARE_PROVIDER_SITE_OTHER): Payer: Medicare Other

## 2015-04-16 DIAGNOSIS — E785 Hyperlipidemia, unspecified: Secondary | ICD-10-CM | POA: Diagnosis not present

## 2015-04-16 DIAGNOSIS — E118 Type 2 diabetes mellitus with unspecified complications: Secondary | ICD-10-CM | POA: Diagnosis not present

## 2015-04-16 DIAGNOSIS — I10 Essential (primary) hypertension: Secondary | ICD-10-CM

## 2015-04-16 DIAGNOSIS — E559 Vitamin D deficiency, unspecified: Secondary | ICD-10-CM | POA: Diagnosis not present

## 2015-04-16 LAB — POCT UA - MICROSCOPIC ONLY
CASTS, UR, LPF, POC: NEGATIVE
CRYSTALS, UR, HPF, POC: NEGATIVE
MUCUS UA: NEGATIVE
RBC, urine, microscopic: NEGATIVE
YEAST UA: NEGATIVE

## 2015-04-16 LAB — POCT CBC
Granulocyte percent: 53.6 %G (ref 37–80)
HCT, POC: 36.2 % — AB (ref 37.7–47.9)
Hemoglobin: 12 g/dL — AB (ref 12.2–16.2)
Lymph, poc: 2.2 (ref 0.6–3.4)
MCH, POC: 29.7 pg (ref 27–31.2)
MCHC: 33.1 g/dL (ref 31.8–35.4)
MCV: 89.6 fL (ref 80–97)
MPV: 8.1 fL (ref 0–99.8)
POC Granulocyte: 3 (ref 2–6.9)
POC LYMPH PERCENT: 38.8 %L (ref 10–50)
Platelet Count, POC: 349 10*3/uL (ref 142–424)
RBC: 4.04 M/uL (ref 4.04–5.48)
RDW, POC: 12.1 %
WBC: 5.6 10*3/uL (ref 4.6–10.2)

## 2015-04-16 LAB — POCT URINALYSIS DIPSTICK
BILIRUBIN UA: NEGATIVE
Blood, UA: NEGATIVE
Glucose, UA: NEGATIVE
KETONES UA: NEGATIVE
Nitrite, UA: NEGATIVE
Protein, UA: NEGATIVE
Spec Grav, UA: 1.01
Urobilinogen, UA: NEGATIVE
pH, UA: 7

## 2015-04-16 LAB — POCT GLYCOSYLATED HEMOGLOBIN (HGB A1C): Hemoglobin A1C: 5.9

## 2015-04-16 MED ORDER — EZETIMIBE 10 MG PO TABS: 10.0000 mg | ORAL_TABLET | Freq: Every day | ORAL | Status: DC

## 2015-04-16 NOTE — Progress Notes (Signed)
Lab only 

## 2015-04-17 LAB — VITAMIN D 25 HYDROXY (VIT D DEFICIENCY, FRACTURES): Vit D, 25-Hydroxy: 51.7 ng/mL (ref 30.0–100.0)

## 2015-04-17 LAB — NMR, LIPOPROFILE
CHOLESTEROL: 134 mg/dL (ref 100–199)
HDL CHOLESTEROL BY NMR: 50 mg/dL (ref >39)
HDL Particle Number: 38.2 umol/L (ref >=30.5)
LDL Particle Number: 852 nmol/L (ref <1000)
LDL SIZE: 20.1 nm (ref >20.5)
LDL-C: 62 mg/dL (ref 0–99)
LP-IR SCORE: 56 — AB (ref <=45)
SMALL LDL PARTICLE NUMBER: 479 nmol/L (ref <=527)
TRIGLYCERIDES BY NMR: 112 mg/dL (ref 0–149)

## 2015-04-17 LAB — BMP8+EGFR
BUN/Creatinine Ratio: 17 (ref 11–26)
BUN: 13 mg/dL (ref 8–27)
CALCIUM: 10.1 mg/dL (ref 8.7–10.3)
CO2: 26 mmol/L (ref 18–29)
Chloride: 94 mmol/L — ABNORMAL LOW (ref 97–108)
Creatinine, Ser: 0.76 mg/dL (ref 0.57–1.00)
GFR calc non Af Amer: 75 mL/min/{1.73_m2} (ref >59)
GFR, EST AFRICAN AMERICAN: 86 mL/min/{1.73_m2} (ref >59)
GLUCOSE: 109 mg/dL — AB (ref 65–99)
POTASSIUM: 4 mmol/L (ref 3.5–5.2)
Sodium: 138 mmol/L (ref 134–144)

## 2015-04-17 LAB — HEPATIC FUNCTION PANEL
ALK PHOS: 64 IU/L (ref 39–117)
ALT: 17 IU/L (ref 0–32)
AST: 15 IU/L (ref 0–40)
Albumin: 4.2 g/dL (ref 3.5–4.8)
Bilirubin Total: 0.4 mg/dL (ref 0.0–1.2)
Bilirubin, Direct: 0.12 mg/dL (ref 0.00–0.40)
Total Protein: 6.5 g/dL (ref 6.0–8.5)

## 2015-04-17 LAB — THYROID PANEL WITH TSH
Free Thyroxine Index: 2.3 (ref 1.2–4.9)
T3 UPTAKE RATIO: 29 % (ref 24–39)
T4, Total: 7.8 ug/dL (ref 4.5–12.0)
TSH: 1.35 u[IU]/mL (ref 0.450–4.500)

## 2015-04-17 LAB — MICROALBUMIN, URINE: Microalbumin, Urine: 39.1 ug/mL

## 2015-04-27 ENCOUNTER — Other Ambulatory Visit: Payer: Self-pay | Admitting: Family Medicine

## 2015-04-30 ENCOUNTER — Other Ambulatory Visit: Payer: Self-pay | Admitting: Family Medicine

## 2015-05-08 ENCOUNTER — Ambulatory Visit (INDEPENDENT_AMBULATORY_CARE_PROVIDER_SITE_OTHER): Payer: Medicare Other

## 2015-05-08 ENCOUNTER — Ambulatory Visit (INDEPENDENT_AMBULATORY_CARE_PROVIDER_SITE_OTHER): Payer: Medicare Other | Admitting: Family Medicine

## 2015-05-08 ENCOUNTER — Encounter: Payer: Self-pay | Admitting: Family Medicine

## 2015-05-08 VITALS — BP 129/69 | HR 70 | Temp 96.9°F | Ht 60.0 in | Wt 143.6 lb

## 2015-05-08 DIAGNOSIS — E559 Vitamin D deficiency, unspecified: Secondary | ICD-10-CM | POA: Diagnosis not present

## 2015-05-08 DIAGNOSIS — E785 Hyperlipidemia, unspecified: Secondary | ICD-10-CM | POA: Diagnosis not present

## 2015-05-08 DIAGNOSIS — I10 Essential (primary) hypertension: Secondary | ICD-10-CM | POA: Diagnosis not present

## 2015-05-08 DIAGNOSIS — E118 Type 2 diabetes mellitus with unspecified complications: Secondary | ICD-10-CM

## 2015-05-08 DIAGNOSIS — Z780 Asymptomatic menopausal state: Secondary | ICD-10-CM

## 2015-05-08 DIAGNOSIS — E1143 Type 2 diabetes mellitus with diabetic autonomic (poly)neuropathy: Secondary | ICD-10-CM | POA: Diagnosis not present

## 2015-05-08 DIAGNOSIS — Z13820 Encounter for screening for osteoporosis: Secondary | ICD-10-CM

## 2015-05-08 MED ORDER — EZETIMIBE 10 MG PO TABS: 10.0000 mg | ORAL_TABLET | Freq: Every day | ORAL | Status: DC

## 2015-05-08 NOTE — Patient Instructions (Addendum)
Medicare Annual Wellness Visit  [facility] and the medical providers at [facility] strive to bring you the best medical care.  In doing so we not only want to address your current medical conditions and concerns but also to detect new conditions early and prevent illness, disease and health-related problems.    Medicare offers a yearly Wellness Visit which allows our clinical staff to assess your need for preventative services including immunizations, lifestyle education, counseling to decrease risk of preventable diseases and screening for fall risk and other medical concerns.    This visit is provided free of charge (no copay) for all Medicare recipients. The clinical pharmacists at [facility] have begun to conduct these Wellness Visits which will also include a thorough review of all your medications.    As you primary medical provider recommend that you make an appointment for your Annual Wellness Visit if you have not done so already this year.  You may set up this appointment before you leave today or you may call back [phone]) and schedule an appointment.  Please make sure when you call that you mention that you are scheduling your Annual Wellness Visit with the clinical pharmacist so that the appointment may be made for the proper length of time.    Continue current medications. Continue good therapeutic lifestyle changes which include good diet and exercise. Fall precautions discussed with patient. If an FOBT was given today- please return it to our front desk. If you are over 36 years old - you may need Prevnar 26 or the adult Pneumonia vaccine.   After your visit with [place] today you will receive a survey in the mail or online from [employer] regarding your care with [place]. Please take a moment to fill this out. Your feedback is very important to [place] as you can help [place] better understand your patient needs as well as improve  your experience and satisfaction. WE CARE ABOUT YOU!!!   The patient can continue to check her blood sugars every other day and she has been doing and bring these readings to the office at the next visit She should exercise as much as possible and watch her diet as closely as possible She should continue to be careful and did not put yourself at risk for falling She should be aware that there is decreased sensation to the bottom toe of the report right foot and she should check her feet daily

## 2015-05-08 NOTE — Progress Notes (Signed)
Subjective:    Patient ID: Theresa Watson, female    DOB: October 27, 1932, 81 y.o.   MRN: [id number]  HPI Pt here for follow up and management of chronic medical problems including hyperlipidemia, hypertension and diabetes.  Patient is taking their medications. The patient comes to the visit today with no specific complaints. Her recent lab work that was done and will be reviewed with her during the visit. All labs were good and within normal limits. The patient denies chest pain, shortness of breath, any problems with her GI system, or problems with voiding. She has not been able to exercise as much as possible because of the heat. She does bring her blood sugars in for review and all of these are excellent and coincide with her good A1c at 5.9%. All of her lab work was reviewed with her.      Patient Active Problem List   Diagnosis Date Noted  . Annual physical exam 02/26/2015  . Type 2 diabetes mellitus with neurological manifestations, controlled 08/29/2013  . Hypertension 11/13/2012  . Cataracts, bilateral 11/13/2012  . Symptomatic menopausal or female climacteric states   . Hyperlipidemia   . Esophagitis   . 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 Obesity, mild   . IBS (irritable bowel syndrome)   . Kidney stone   . Lumbosacral spondylosis without myelopathy   . Pain in joint, shoulder region   . Colon polyp   . Diverticulosis    Outpatient Encounter Prescriptions as of 05/08/2015  Medication Sig  . aspirin 81 MG EC tablet Take 81 mg by mouth daily.    . Cholecalciferol (VITAMIN D3) 2000 UNITS TABS Take 1 tablet by mouth daily.    Marland Kitchen ezetimibe (ZETIA) 10 MG tablet Take 1 tablet (10 mg total) by mouth daily.  . ferrous sulfate 325 (65 FE) MG EC tablet Take 325 mg by mouth daily with breakfast.    . gabapentin (NEURONTIN) 100 MG capsule TAKE THREE CAPSULES BY MOUTH AT BEDTIME  . Garlic 035 MG TABS Take 1 tablet by mouth 2 (two) times daily.   Marland Kitchen geriatric  multivitamins-minerals (ELDERTONIC/GEVRABON) ELIX Take 15 mLs by mouth daily.    Marland Kitchen glucose blood (ACCU-CHEK AVIVA PLUS) test strip Test twice daily     E11.43  . metFORMIN (GLUCOPHAGE) 500 MG tablet TAKE ONE TABLET BY MOUTH TWICE A DAY WITH MEALS  . Omega-3 Fatty Acids (FISH OIL) 1000 MG CAPS Take 2 capsules by mouth daily.   Marland Kitchen omeprazole (PRILOSEC) 20 MG capsule Take 20 mg by mouth daily.    . potassium chloride SA (K-DUR,KLOR-CON) 20 MEQ tablet TAKE ONE TABLET BY MOUTH ONE TIME DAILY  . pravastatin (PRAVACHOL) 80 MG tablet TAKE ONE TABLET BY MOUTH ONE TIME DAILY  . triamterene-hydrochlorothiazide (MAXZIDE-25) 37.5-25 MG per tablet TAKE ONE TABLET BY MOUTH ONE TIME DAILY   No facility-administered encounter medications on file as of 05/08/2015.     Review of Systems  Constitutional: Negative.   HENT: Negative.   Eyes: Negative.   Respiratory: Negative.   Cardiovascular: Negative.   Gastrointestinal: Negative.   Endocrine: Negative.   Genitourinary: Negative.   Musculoskeletal: Negative.   Skin: Negative.   Allergic/Immunologic: Negative.   Neurological: Negative.   Hematological: Negative.   Psychiatric/Behavioral: Negative.        Objective:   Physical Exam  Constitutional: She is oriented to person, place, and time. She appears well-developed and well-nourished.  HENT:  Head: Normocephalic and atraumatic.  Right Ear: External ear normal.  Left Ear: External ear normal.  Nose: Nose normal.  Mouth/Throat: Oropharynx is clear and moist. No oropharyngeal exudate.  Eyes: Conjunctivae and EOM are normal. Pupils are equal, round, and reactive to light. Right eye exhibits no discharge. Left eye exhibits no discharge. No scleral icterus.  Neck: Normal range of motion. Neck supple. No thyromegaly present.  Cardiovascular: Normal rate, regular rhythm, normal heart sounds and intact distal pulses.  Exam reveals no friction rub.   No murmur heard. At 84/m  Pulmonary/Chest: Effort  normal and breath sounds normal. No respiratory distress. She has no wheezes. She has no rales. She exhibits no tenderness.  Clear anteriorly and posteriorly  Abdominal: Soft. Bowel sounds are normal. She exhibits no mass. There is no tenderness. There is no rebound and no guarding.  Nontender without masses bruits or organ enlargement  Musculoskeletal: Normal range of motion. She exhibits no edema or tenderness.  Lymphadenopathy:    She has no cervical adenopathy.  Neurological: She is alert and oriented to person, place, and time. She has normal reflexes. No cranial nerve deficit.  Skin: Skin is warm and dry. No rash noted. No erythema. No pallor.  Psychiatric: She has a normal mood and affect. Her behavior is normal. Judgment and thought content normal.  Nursing note and vitals reviewed.    BP 129/69 mmHg  Pulse 70  Temp(Src) 96.9 F (36.1 C) (Oral)  Ht 5' (1.524 m)  Wt 143 lb 9.6 oz (65.137 kg)  BMI 28.05 kg/m2       Assessment & Plan:  1. Essential hypertension -The blood pressure is good today and the patient will continue to take her current medication and watch her sodium intake  2. Diabetic autonomic neuropathy associated with type 2 diabetes mellitus -The patient's blood sugars were good and her A1c was excellent. She will continue her current treatment regimen  3. Vitamin D deficiency -The vitamin D was good and she will continue with current treatment - DG Bone Density; Future  4. Hyperlipidemia -Cholesterol numbers were excellent she will continue with current treatment and exercise  5. Type 2 diabetes mellitus with complication -She will continue with current treatment  6. Screening for osteoporosis -This will be checked today before the patient leaves office -She should continue with her current vitamin D and calcium replacement - DG Bone Density; Future  7. Postmenopausal -This will be checked before the patient leaves office today. - DG Bone Density;  Future  Meds ordered this encounter  Medications  . ezetimibe (ZETIA) 10 MG tablet    Sig: Take 1 tablet (10 mg total) by mouth daily.    Dispense:  28 tablet    Refill:  0    Lot # [id number] Exp 08/2017   Patient Instructions                       Medicare Annual Wellness Visit  [facility] and the medical providers at [facility] strive to bring you the best medical care.  In doing so we not only want to address your current medical conditions and concerns but also to detect new conditions early and prevent illness, disease and health-related problems.    Medicare offers a yearly Wellness Visit which allows our clinical staff to assess your need for preventative services including immunizations, lifestyle education, counseling to decrease risk of preventable diseases and screening for fall risk and other medical concerns.    This visit  is provided free of charge (no copay) for all Medicare recipients. The clinical pharmacists at [facility] have begun to conduct these Wellness Visits which will also include a thorough review of all your medications.    As you primary medical provider recommend that you make an appointment for your Annual Wellness Visit if you have not done so already this year.  You may set up this appointment before you leave today or you may call back ([phone]) and schedule an appointment.  Please make sure when you call that you mention that you are scheduling your Annual Wellness Visit with the clinical pharmacist so that the appointment may be made for the proper length of time.    Continue current medications. Continue good therapeutic lifestyle changes which include good diet and exercise. Fall precautions discussed with patient. If an FOBT was given today- please return it to our front desk. If you are over 71 years old - you may need Prevnar 42 or the adult Pneumonia vaccine.   After your visit with [place] today you will  receive a survey in the mail or online from [employer] regarding your care with [place]. Please take a moment to fill this out. Your feedback is very important to [place] as you can help [place] better understand your patient needs as well as improve your experience and satisfaction. WE CARE ABOUT YOU!!!   The patient can continue to check her blood sugars every other day and she has been doing and bring these readings to the office at the next visit She should exercise as much as possible and watch her diet as closely as possible She should continue to be careful and did not put yourself at risk for falling She should be aware that there is decreased sensation to the bottom toe of the report right foot and she should check her feet daily   Arrie Senate MD

## 2015-05-26 ENCOUNTER — Other Ambulatory Visit: Payer: Self-pay | Admitting: Nurse Practitioner

## 2015-06-24 ENCOUNTER — Other Ambulatory Visit: Payer: Self-pay | Admitting: Family Medicine

## 2015-07-05 ENCOUNTER — Other Ambulatory Visit: Payer: Self-pay | Admitting: *Deleted

## 2015-07-05 ENCOUNTER — Ambulatory Visit (INDEPENDENT_AMBULATORY_CARE_PROVIDER_SITE_OTHER): Payer: Medicare Other

## 2015-07-05 DIAGNOSIS — Z23 Encounter for immunization: Secondary | ICD-10-CM | POA: Diagnosis not present

## 2015-07-05 MED ORDER — EZETIMIBE 10 MG PO TABS: 10.0000 mg | ORAL_TABLET | Freq: Every day | ORAL | Status: DC

## 2015-07-18 ENCOUNTER — Other Ambulatory Visit: Payer: Self-pay

## 2015-07-18 ENCOUNTER — Telehealth: Payer: Self-pay | Admitting: Family Medicine

## 2015-07-18 MED ORDER — POTASSIUM CHLORIDE CRYS ER 20 MEQ PO TBCR: 20.0000 meq | EXTENDED_RELEASE_TABLET | Freq: Every day | ORAL | Status: DC

## 2015-07-18 MED ORDER — METFORMIN HCL 500 MG PO TABS: 500.0000 mg | ORAL_TABLET | Freq: Two times a day (BID) | ORAL | Status: DC

## 2015-07-18 MED ORDER — PRAVASTATIN SODIUM 80 MG PO TABS: 80.0000 mg | ORAL_TABLET | Freq: Every day | ORAL | Status: DC

## 2015-07-18 MED ORDER — TRIAMTERENE-HCTZ 37.5-25 MG PO TABS: 1.0000 | ORAL_TABLET | Freq: Every day | ORAL | Status: DC

## 2015-07-18 MED ORDER — GABAPENTIN 100 MG PO CAPS: 300.0000 mg | ORAL_CAPSULE | Freq: Every day | ORAL | Status: DC

## 2015-07-25 ENCOUNTER — Other Ambulatory Visit: Payer: Self-pay | Admitting: Family Medicine

## 2015-08-01 MED ORDER — GLUCOSE BLOOD VI STRP
ORAL_STRIP | Status: DC
Start: 2015-08-01 — End: 2016-10-15

## 2015-08-01 NOTE — Telephone Encounter (Signed)
Ordered today, just received request from [employer] with which kind, pt aware

## 2015-09-03 ENCOUNTER — Other Ambulatory Visit (INDEPENDENT_AMBULATORY_CARE_PROVIDER_SITE_OTHER): Payer: Medicare Other

## 2015-09-03 DIAGNOSIS — I10 Essential (primary) hypertension: Secondary | ICD-10-CM | POA: Diagnosis not present

## 2015-09-03 DIAGNOSIS — E118 Type 2 diabetes mellitus with unspecified complications: Secondary | ICD-10-CM

## 2015-09-03 DIAGNOSIS — E785 Hyperlipidemia, unspecified: Secondary | ICD-10-CM | POA: Diagnosis not present

## 2015-09-03 DIAGNOSIS — E559 Vitamin D deficiency, unspecified: Secondary | ICD-10-CM

## 2015-09-03 LAB — POCT GLYCOSYLATED HEMOGLOBIN (HGB A1C): HEMOGLOBIN A1C: 6.2

## 2015-09-04 LAB — CBC WITH DIFFERENTIAL/PLATELET
BASOS ABS: 0 10*3/uL (ref 0.0–0.2)
Basos: 1 %
EOS (ABSOLUTE): 0.2 10*3/uL (ref 0.0–0.4)
EOS: 3 %
HEMATOCRIT: 37 % (ref 34.0–46.6)
HEMOGLOBIN: 12.5 g/dL (ref 11.1–15.9)
Immature Grans (Abs): 0 10*3/uL (ref 0.0–0.1)
Immature Granulocytes: 0 %
LYMPHS ABS: 2 10*3/uL (ref 0.7–3.1)
Lymphs: 37 %
MCH: 30.8 pg (ref 26.6–33.0)
MCHC: 33.8 g/dL (ref 31.5–35.7)
MCV: 91 fL (ref 79–97)
MONOCYTES: 8 %
Monocytes Absolute: 0.4 10*3/uL (ref 0.1–0.9)
NEUTROS ABS: 2.7 10*3/uL (ref 1.4–7.0)
Neutrophils: 51 %
Platelets: 356 10*3/uL (ref 150–379)
RBC: 4.06 x10E6/uL (ref 3.77–5.28)
RDW: 12.9 % (ref 12.3–15.4)
WBC: 5.3 10*3/uL (ref 3.4–10.8)

## 2015-09-04 LAB — HEPATIC FUNCTION PANEL
ALBUMIN: 4.4 g/dL (ref 3.5–4.7)
ALT: 19 IU/L (ref 0–32)
AST: 18 IU/L (ref 0–40)
Alkaline Phosphatase: 73 IU/L (ref 39–117)
BILIRUBIN TOTAL: 0.5 mg/dL (ref 0.0–1.2)
BILIRUBIN, DIRECT: 0.15 mg/dL (ref 0.00–0.40)
Total Protein: 6.6 g/dL (ref 6.0–8.5)

## 2015-09-04 LAB — BMP8+EGFR
BUN / CREAT RATIO: 16 (ref 11–26)
BUN: 12 mg/dL (ref 8–27)
CO2: 27 mmol/L (ref 18–29)
CREATININE: 0.75 mg/dL (ref 0.57–1.00)
Calcium: 10.4 mg/dL — ABNORMAL HIGH (ref 8.7–10.3)
Chloride: 95 mmol/L — ABNORMAL LOW (ref 97–106)
GFR, EST AFRICAN AMERICAN: 87 mL/min/{1.73_m2} (ref >59)
GFR, EST NON AFRICAN AMERICAN: 76 mL/min/{1.73_m2} (ref >59)
Glucose: 115 mg/dL — ABNORMAL HIGH (ref 65–99)
Potassium: 3.8 mmol/L (ref 3.5–5.2)
SODIUM: 136 mmol/L (ref 136–144)

## 2015-09-04 LAB — NMR, LIPOPROFILE
Cholesterol: 147 mg/dL (ref 100–199)
HDL CHOLESTEROL BY NMR: 59 mg/dL (ref >39)
HDL PARTICLE NUMBER: 41.6 umol/L (ref >=30.5)
LDL PARTICLE NUMBER: 1030 nmol/L — AB (ref <1000)
LDL Size: 20.4 nm (ref >20.5)
LDL-C: 70 mg/dL (ref 0–99)
LP-IR SCORE: 34 (ref <=45)
Small LDL Particle Number: 536 nmol/L — ABNORMAL HIGH (ref <=527)
Triglycerides by NMR: 90 mg/dL (ref 0–149)

## 2015-09-04 LAB — VITAMIN D 25 HYDROXY (VIT D DEFICIENCY, FRACTURES): VIT D 25 HYDROXY: 45.7 ng/mL (ref 30.0–100.0)

## 2015-09-05 ENCOUNTER — Other Ambulatory Visit: Payer: Self-pay | Admitting: Family Medicine

## 2015-09-18 ENCOUNTER — Ambulatory Visit (INDEPENDENT_AMBULATORY_CARE_PROVIDER_SITE_OTHER): Payer: Medicare Other | Admitting: Family Medicine

## 2015-09-18 ENCOUNTER — Encounter: Payer: Self-pay | Admitting: Family Medicine

## 2015-09-18 VITALS — BP 132/77 | HR 68 | Temp 97.0°F | Ht 60.0 in | Wt 146.0 lb

## 2015-09-18 DIAGNOSIS — E0843 Diabetes mellitus due to underlying condition with diabetic autonomic (poly)neuropathy: Secondary | ICD-10-CM | POA: Diagnosis not present

## 2015-09-18 DIAGNOSIS — E785 Hyperlipidemia, unspecified: Secondary | ICD-10-CM | POA: Diagnosis not present

## 2015-09-18 DIAGNOSIS — L819 Disorder of pigmentation, unspecified: Secondary | ICD-10-CM

## 2015-09-18 DIAGNOSIS — E118 Type 2 diabetes mellitus with unspecified complications: Secondary | ICD-10-CM | POA: Diagnosis not present

## 2015-09-18 DIAGNOSIS — I10 Essential (primary) hypertension: Secondary | ICD-10-CM | POA: Diagnosis not present

## 2015-09-18 DIAGNOSIS — E559 Vitamin D deficiency, unspecified: Secondary | ICD-10-CM | POA: Diagnosis not present

## 2015-09-18 NOTE — Progress Notes (Signed)
Subjective:    Patient ID: Theresa Watson, female    DOB: 31-Jan-1933, 81 y.o.   MRN: [id number]  HPI Pt here for follow up and management of chronic medical problems which includes diabetes, hypertension, and hyperlipidemia. She is taking medications regularly and she is accompanied today by her granddaughter, [name]. Patient is doing well overall. She is concerned about some skin lesions near her left ear. She also recently lost a toenail on the third toe of the left foot. Her recent lab work will be reviewed with her today and she will also be given an FOBT to return. She brings in blood sugars for review since August and all of these look great and she will continue to be asked to monitor her blood sugars at home. Patient denies chest pain or shortness of breath. She has no trouble swallowing and has no heartburn indigestion nausea vomiting diarrhea or blood in the stool. She is passing her water without problems. She does have the skin lesion below her left ear and we will take a look at that during the visit.       Patient Active Problem List   Diagnosis Date Noted  . Annual physical exam 02/26/2015  . Type 2 diabetes mellitus with neurological manifestations, controlled ([facility]) 08/29/2013  . Hypertension 11/13/2012  . Cataracts, bilateral 11/13/2012  . Symptomatic menopausal or female climacteric states   . Hyperlipidemia   . Esophagitis   . 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 Obesity, mild   . IBS (irritable bowel syndrome)   . Kidney stone   . Lumbosacral spondylosis without myelopathy   . Pain in joint, shoulder region   . Colon polyp   . Diverticulosis    Outpatient Encounter Prescriptions as of 09/18/2015  Medication Sig  . aspirin 81 MG EC tablet Take 81 mg by mouth daily.    . Cholecalciferol (VITAMIN D3) 2000 UNITS TABS Take 1 tablet by mouth daily.    Marland Kitchen ezetimibe (ZETIA) 10 MG tablet Take 1 tablet (10 mg total) by mouth daily.  . ferrous  sulfate 325 (65 FE) MG EC tablet Take 325 mg by mouth daily with breakfast.    . gabapentin (NEURONTIN) 100 MG capsule Take 3 capsules by mouth at bedtime  . Garlic 123XX123 MG TABS Take 1 tablet by mouth 2 (two) times daily.   Marland Kitchen geriatric multivitamins-minerals (ELDERTONIC/GEVRABON) ELIX Take 15 mLs by mouth daily.    Marland Kitchen glucose blood (ACCU-CHEK AVIVA PLUS) test strip Test twice daily     E11.43  . metFORMIN (GLUCOPHAGE) 500 MG tablet Take 1 tablet by mouth two  times daily with meals  . Omega-3 Fatty Acids (FISH OIL) 1000 MG CAPS Take 2 capsules by mouth daily.   Marland Kitchen omeprazole (PRILOSEC) 20 MG capsule Take 20 mg by mouth daily.    . potassium chloride SA (K-DUR,KLOR-CON) 20 MEQ tablet Take 1 tablet by mouth  daily  . pravastatin (PRAVACHOL) 80 MG tablet Take 1 tablet by mouth  daily  . triamterene-hydrochlorothiazide (MAXZIDE-25) 37.5-25 MG tablet Take 1 tablet by mouth  daily   No facility-administered encounter medications on file as of 09/18/2015.     Review of Systems  Constitutional: Negative.   HENT: Negative.   Eyes: Negative.   Respiratory: Negative.   Cardiovascular: Negative.   Gastrointestinal: Negative.   Endocrine: Negative.   Genitourinary: Negative.   Skin: Negative.        Left ear/ neck -- skin lesions.  Allergic/Immunologic: Negative.   Neurological: Negative.   Hematological: Negative.   Psychiatric/Behavioral: Negative.        Objective:   Physical Exam  Constitutional: She is oriented to person, place, and time. She appears well-developed and well-nourished. No distress.  HENT:  Head: Normocephalic and atraumatic.  Right Ear: External ear normal.  Left Ear: External ear normal.  Nose: Nose normal.  Mouth/Throat: Oropharynx is clear and moist.  Eyes: Conjunctivae and EOM are normal. Pupils are equal, round, and reactive to light. Right eye exhibits no discharge. Left eye exhibits no discharge. No scleral icterus.  Neck: Normal range of motion. Neck supple. No  thyromegaly present.  Cardiovascular: Normal rate, regular rhythm and intact distal pulses.   No murmur heard. The heart is regular at 72/m  Pulmonary/Chest: Effort normal and breath sounds normal. No respiratory distress. She has no wheezes. She has no rales. She exhibits no tenderness.  Clear anteriorly and posteriorly  Abdominal: Soft. Bowel sounds are normal. She exhibits no mass. There is no tenderness. There is no rebound and no guarding.  Abdominal obesity without masses or tenderness  Musculoskeletal: Normal range of motion. She exhibits no edema.  Lymphadenopathy:    She has no cervical adenopathy.  Neurological: She is alert and oriented to person, place, and time. She has normal reflexes. No cranial nerve deficit.  There is slightly decreased sensation on the plantar surface of the right great toe. There is mild callus formation on both toes and bunions bilaterally.  Skin: Skin is warm and dry. No rash noted.  Psychiatric: She has a normal mood and affect. Her behavior is normal. Judgment and thought content normal.  Nursing note and vitals reviewed.  BP 132/77 mmHg  Pulse 68  Temp(Src) 97 F (36.1 C) (Oral)  Ht 5' (1.524 m)  Wt 146 lb (66.225 kg)  BMI 28.51 kg/m2        Assessment & Plan:  1. Vitamin D deficiency -The vitamin D level was good and she will continue with current treatment  2. Hyperlipidemia -All cholesterol numbers were good and she will continue with current treatment  3. Type 2 diabetes mellitus with complication, without long-term current use of insulin (HCC) -The hemoglobin A1c was also good at 6.7% she will continue with current treatment  4. Essential hypertension -Blood pressure was good and she will continue with current treatment  5. Atypical pigmented skin lesion -The patient has a shiny ulcerated lesion below the left ear and the granddaughter will make her an appointment to see her dermatologist, Dr. Nevada Crane.  6. Diabetes mellitus due  to underlying condition with diabetic autonomic neuropathy, without long-term current use of insulin (HCC) -Continue with current treatment and continue to monitor feet and toes regularly for any sign of infection  Patient Instructions                       Medicare Annual Wellness Visit  [facility] and the medical providers at [facility] strive to bring you the best medical care.  In doing so we not only want to address your current medical conditions and concerns but also to detect new conditions early and prevent illness, disease and health-related problems.    Medicare offers a yearly Wellness Visit which allows our clinical staff to assess your need for preventative services including immunizations, lifestyle education, counseling to decrease risk of preventable diseases and screening for fall risk and other medical concerns.    This visit  is provided free of charge (no copay) for all Medicare recipients. The clinical pharmacists at [facility] have begun to conduct these Wellness Visits which will also include a thorough review of all your medications.    As you primary medical provider recommend that you make an appointment for your Annual Wellness Visit if you have not done so already this year.  You may set up this appointment before you leave today or you may call back [phone] and schedule an appointment.  Please make sure when you call that you mention that you are scheduling your Annual Wellness Visit with the clinical pharmacist so that the appointment may be made for the proper length of time.     Continue current medications. Continue good therapeutic lifestyle changes which include good diet and exercise. Fall precautions discussed with patient. If an FOBT was given today- please return it to our front desk. If you are over 40 years old - you may need Prevnar 65 or the adult Pneumonia vaccine.  **Flu shots are available--- please  call and schedule a FLU-CLINIC appointment**  After your visit with [place] today you will receive a survey in the mail or online from [employer] regarding your care with [place]. Please take a moment to fill this out. Your feedback is very important to [place] as you can help [place] better understand your patient needs as well as improve your experience and satisfaction. WE CARE ABOUT YOU!!!   The patient will make arrangements to follow-up with the dermatologist regarding the skin lesion below her left ear. She will continue to watch her weight and diet as closely as possible She will be careful not to climb and put yourself at risk for falling   Arrie Senate MD

## 2015-09-18 NOTE — Patient Instructions (Addendum)
Medicare Annual Wellness Visit  [facility] and the medical providers at [facility] strive to bring you the best medical care.  In doing so we not only want to address your current medical conditions and concerns but also to detect new conditions early and prevent illness, disease and health-related problems.    Medicare offers a yearly Wellness Visit which allows our clinical staff to assess your need for preventative services including immunizations, lifestyle education, counseling to decrease risk of preventable diseases and screening for fall risk and other medical concerns.    This visit is provided free of charge (no copay) for all Medicare recipients. The clinical pharmacists at [facility] have begun to conduct these Wellness Visits which will also include a thorough review of all your medications.    As you primary medical provider recommend that you make an appointment for your Annual Wellness Visit if you have not done so already this year.  You may set up this appointment before you leave today or you may call back [phone] and schedule an appointment.  Please make sure when you call that you mention that you are scheduling your Annual Wellness Visit with the clinical pharmacist so that the appointment may be made for the proper length of time.     Continue current medications. Continue good therapeutic lifestyle changes which include good diet and exercise. Fall precautions discussed with patient. If an FOBT was given today- please return it to our front desk. If you are over 36 years old - you may need Prevnar 75 or the adult Pneumonia vaccine.  **Flu shots are available--- please call and schedule a FLU-CLINIC appointment**  After your visit with [place] today you will receive a survey in the mail or online from [employer] regarding your care with [place]. Please take a moment to fill this out. Your feedback is very  important to [place] as you can help [place] better understand your patient needs as well as improve your experience and satisfaction. WE CARE ABOUT YOU!!!   The patient will make arrangements to follow-up with the dermatologist regarding the skin lesion below her left ear. She will continue to watch her weight and diet as closely as possible She will be careful not to climb and put yourself at risk for falling

## 2015-10-22 ENCOUNTER — Other Ambulatory Visit: Payer: Medicare Other

## 2015-10-22 DIAGNOSIS — Z1212 Encounter for screening for malignant neoplasm of rectum: Secondary | ICD-10-CM | POA: Diagnosis not present

## 2015-10-22 DIAGNOSIS — L570 Actinic keratosis: Secondary | ICD-10-CM | POA: Diagnosis not present

## 2015-10-22 DIAGNOSIS — X32XXXD Exposure to sunlight, subsequent encounter: Secondary | ICD-10-CM | POA: Diagnosis not present

## 2015-10-23 LAB — FECAL OCCULT BLOOD, IMMUNOCHEMICAL: FECAL OCCULT BLD: NEGATIVE

## 2015-11-12 DIAGNOSIS — H40033 Anatomical narrow angle, bilateral: Secondary | ICD-10-CM | POA: Diagnosis not present

## 2015-11-12 DIAGNOSIS — H2513 Age-related nuclear cataract, bilateral: Secondary | ICD-10-CM | POA: Diagnosis not present

## 2015-11-12 LAB — HM DIABETES EYE EXAM

## 2015-11-13 DIAGNOSIS — H2513 Age-related nuclear cataract, bilateral: Secondary | ICD-10-CM | POA: Diagnosis not present

## 2015-11-15 DIAGNOSIS — Z961 Presence of intraocular lens: Secondary | ICD-10-CM | POA: Diagnosis not present

## 2015-11-15 DIAGNOSIS — H2511 Age-related nuclear cataract, right eye: Secondary | ICD-10-CM | POA: Diagnosis not present

## 2015-11-22 ENCOUNTER — Encounter: Payer: Self-pay | Admitting: *Deleted

## 2015-11-29 DIAGNOSIS — Z961 Presence of intraocular lens: Secondary | ICD-10-CM | POA: Diagnosis not present

## 2015-11-29 DIAGNOSIS — H2512 Age-related nuclear cataract, left eye: Secondary | ICD-10-CM | POA: Diagnosis not present

## 2015-11-29 DIAGNOSIS — H40033 Anatomical narrow angle, bilateral: Secondary | ICD-10-CM | POA: Diagnosis not present

## 2016-01-28 ENCOUNTER — Encounter: Payer: Medicare Other | Admitting: *Deleted

## 2016-01-28 ENCOUNTER — Other Ambulatory Visit: Payer: Medicare Other

## 2016-01-28 DIAGNOSIS — E118 Type 2 diabetes mellitus with unspecified complications: Secondary | ICD-10-CM

## 2016-01-28 DIAGNOSIS — E785 Hyperlipidemia, unspecified: Secondary | ICD-10-CM | POA: Diagnosis not present

## 2016-01-28 DIAGNOSIS — I10 Essential (primary) hypertension: Secondary | ICD-10-CM | POA: Diagnosis not present

## 2016-01-28 DIAGNOSIS — E559 Vitamin D deficiency, unspecified: Secondary | ICD-10-CM | POA: Diagnosis not present

## 2016-01-28 DIAGNOSIS — Z1231 Encounter for screening mammogram for malignant neoplasm of breast: Secondary | ICD-10-CM | POA: Diagnosis not present

## 2016-01-28 LAB — HM MAMMOGRAPHY

## 2016-01-28 LAB — BAYER DCA HB A1C WAIVED: HB A1C (BAYER DCA - WAIVED): 6.4 % (ref <7.0)

## 2016-01-29 LAB — CBC WITH DIFFERENTIAL/PLATELET
BASOS: 0 %
Basophils Absolute: 0 10*3/uL (ref 0.0–0.2)
EOS (ABSOLUTE): 0.2 10*3/uL (ref 0.0–0.4)
EOS: 3 %
Hematocrit: 36.8 % (ref 34.0–46.6)
Hemoglobin: 12.5 g/dL (ref 11.1–15.9)
IMMATURE GRANS (ABS): 0 10*3/uL (ref 0.0–0.1)
IMMATURE GRANULOCYTES: 0 %
LYMPHS: 38 %
Lymphocytes Absolute: 2.2 10*3/uL (ref 0.7–3.1)
MCH: 31.3 pg (ref 26.6–33.0)
MCHC: 34 g/dL (ref 31.5–35.7)
MCV: 92 fL (ref 79–97)
MONOCYTES: 9 %
Monocytes Absolute: 0.5 10*3/uL (ref 0.1–0.9)
NEUTROS PCT: 50 %
Neutrophils Absolute: 2.8 10*3/uL (ref 1.4–7.0)
PLATELETS: 341 10*3/uL (ref 150–379)
RBC: 3.99 x10E6/uL (ref 3.77–5.28)
RDW: 13.1 % (ref 12.3–15.4)
WBC: 5.7 10*3/uL (ref 3.4–10.8)

## 2016-01-29 LAB — BMP8+EGFR
BUN/Creatinine Ratio: 14 (ref 12–28)
BUN: 10 mg/dL (ref 8–27)
CO2: 26 mmol/L (ref 18–29)
CREATININE: 0.71 mg/dL (ref 0.57–1.00)
Calcium: 10.4 mg/dL — ABNORMAL HIGH (ref 8.7–10.3)
Chloride: 93 mmol/L — ABNORMAL LOW (ref 96–106)
GFR calc Af Amer: 93 mL/min/{1.73_m2} (ref >59)
GFR, EST NON AFRICAN AMERICAN: 81 mL/min/{1.73_m2} (ref >59)
Glucose: 102 mg/dL — ABNORMAL HIGH (ref 65–99)
Potassium: 3.8 mmol/L (ref 3.5–5.2)
SODIUM: 135 mmol/L (ref 134–144)

## 2016-01-29 LAB — LIPID PANEL
CHOLESTEROL TOTAL: 146 mg/dL (ref 100–199)
Chol/HDL Ratio: 2.8 ratio units (ref 0.0–4.4)
HDL: 53 mg/dL (ref >39)
LDL CALC: 63 mg/dL (ref 0–99)
Triglycerides: 152 mg/dL — ABNORMAL HIGH (ref 0–149)
VLDL CHOLESTEROL CAL: 30 mg/dL (ref 5–40)

## 2016-01-29 LAB — HEPATIC FUNCTION PANEL
ALT: 20 IU/L (ref 0–32)
AST: 19 IU/L (ref 0–40)
Albumin: 4.3 g/dL (ref 3.5–4.7)
Alkaline Phosphatase: 71 IU/L (ref 39–117)
BILIRUBIN TOTAL: 0.5 mg/dL (ref 0.0–1.2)
BILIRUBIN, DIRECT: 0.15 mg/dL (ref 0.00–0.40)
TOTAL PROTEIN: 6.4 g/dL (ref 6.0–8.5)

## 2016-01-29 LAB — VITAMIN D 25 HYDROXY (VIT D DEFICIENCY, FRACTURES): Vit D, 25-Hydroxy: 44.1 ng/mL (ref 30.0–100.0)

## 2016-02-07 ENCOUNTER — Encounter: Payer: Self-pay | Admitting: *Deleted

## 2016-02-08 ENCOUNTER — Ambulatory Visit (INDEPENDENT_AMBULATORY_CARE_PROVIDER_SITE_OTHER): Payer: Medicare Other | Admitting: Family Medicine

## 2016-02-08 ENCOUNTER — Encounter: Payer: Self-pay | Admitting: Family Medicine

## 2016-02-08 VITALS — BP 115/66 | HR 78 | Temp 97.3°F | Ht 60.0 in | Wt 148.0 lb

## 2016-02-08 DIAGNOSIS — E559 Vitamin D deficiency, unspecified: Secondary | ICD-10-CM

## 2016-02-08 DIAGNOSIS — E1149 Type 2 diabetes mellitus with other diabetic neurological complication: Secondary | ICD-10-CM

## 2016-02-08 DIAGNOSIS — E118 Type 2 diabetes mellitus with unspecified complications: Secondary | ICD-10-CM

## 2016-02-08 DIAGNOSIS — E0843 Diabetes mellitus due to underlying condition with diabetic autonomic (poly)neuropathy: Secondary | ICD-10-CM

## 2016-02-08 DIAGNOSIS — I10 Essential (primary) hypertension: Secondary | ICD-10-CM

## 2016-02-08 DIAGNOSIS — E1169 Type 2 diabetes mellitus with other specified complication: Secondary | ICD-10-CM | POA: Diagnosis not present

## 2016-02-08 DIAGNOSIS — E785 Hyperlipidemia, unspecified: Secondary | ICD-10-CM

## 2016-02-08 NOTE — Patient Instructions (Addendum)
Medicare Annual Wellness Visit  [facility] and the medical providers at [facility] strive to bring you the best medical care.  In doing so we not only want to address your current medical conditions and concerns but also to detect new conditions early and prevent illness, disease and health-related problems.    Medicare offers a yearly Wellness Visit which allows our clinical staff to assess your need for preventative services including immunizations, lifestyle education, counseling to decrease risk of preventable diseases and screening for fall risk and other medical concerns.    This visit is provided free of charge (no copay) for all Medicare recipients. The clinical pharmacists at [facility] have begun to conduct these Wellness Visits which will also include a thorough review of all your medications.    As you primary medical provider recommend that you make an appointment for your Annual Wellness Visit if you have not done so already this year.  You may set up this appointment before you leave today or you may call back [phone] and schedule an appointment.  Please make sure when you call that you mention that you are scheduling your Annual Wellness Visit with the clinical pharmacist so that the appointment may be made for the proper length of time.     Continue current medications. Continue good therapeutic lifestyle changes which include good diet and exercise. Fall precautions discussed with patient. If an FOBT was given today- please return it to our front desk. If you are over 55 years old - you may need Prevnar 83 or the adult Pneumonia vaccine.  **Flu shots are available--- please call and schedule a FLU-CLINIC appointment**  After your visit with [place] today you will receive a survey in the mail or online from [employer] regarding your care with [place]. Please take a moment to fill this out. Your feedback is very  important to [place] as you can help [place] better understand your patient needs as well as improve your experience and satisfaction. WE CARE ABOUT YOU!!!   The patient will try to reduce her caffeine intake She will try some le croix which is a flavored drink that has no caffeine over-the-counter counter at the grocery store. She will continue to keep her feet checked regularly She will follow-up aggressive therapeutic lifestyle changes

## 2016-02-08 NOTE — Progress Notes (Signed)
Subjective:    Patient ID: Theresa Watson, female    DOB: 01-Mar-1933, 82 y.o.   MRN: [id number]  HPI Pt here for follow up and management of chronic medical problems which includes hypertension, hyperlipidemia, and diabetes. She is taking medications regularly. Patient has had recent blood work done and this will be reviewed with her in the office today. Her weight is up 2 pounds from the previous visit. She also brings in blood sugars for review and these seem to range throughout the record or recording. Anywhere from about 110 fasting up to 144 during the day. She has no specific complaints. All lab work was good with a hemoglobin A1c being 6.4%. The patient had cataract surgery in both eyes recently and can see much better. She denies any chest pain shortness of breath trouble swallowing heartburn indigestion nausea vomiting or blood in the stool or black tarry bowel movements. She does complain of some loose bowel movements. She was not aware that metformin could play a role with this. She does drink 3 or 4 cups of coffee with caffeine daily.     Patient Active Problem List   Diagnosis Date Noted  . Diabetes mellitus due to underlying condition with diabetic autonomic neuropathy, without long-term current use of insulin ([facility]) 09/18/2015  . Annual physical exam 02/26/2015  . Type 2 diabetes mellitus with neurological manifestations, controlled ([place]) 08/29/2013  . Hypertension 11/13/2012  . Cataracts, bilateral 11/13/2012  . Symptomatic menopausal or female climacteric states   . Hyperlipidemia   . Esophagitis   . 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 Obesity, mild   . IBS (irritable bowel syndrome)   . Kidney stone   . Lumbosacral spondylosis without myelopathy   . Pain in joint, shoulder region   . Colon polyp   . Diverticulosis    Outpatient Encounter Prescriptions as of 02/08/2016  Medication Sig  . aspirin 81 MG EC tablet Take 81 mg by mouth daily.     . Cholecalciferol (VITAMIN D3) 2000 UNITS TABS Take 1 tablet by mouth daily.    . ferrous sulfate 325 (65 FE) MG EC tablet Take 325 mg by mouth daily with breakfast.    . gabapentin (NEURONTIN) 100 MG capsule Take 3 capsules by mouth at bedtime  . Garlic 123XX123 MG TABS Take 1 tablet by mouth 2 (two) times daily.   Marland Kitchen geriatric multivitamins-minerals (ELDERTONIC/GEVRABON) ELIX Take 15 mLs by mouth daily.    Marland Kitchen glucose blood (ACCU-CHEK AVIVA PLUS) test strip Test twice daily     E11.43  . metFORMIN (GLUCOPHAGE) 500 MG tablet Take 1 tablet by mouth two  times daily with meals  . Omega-3 Fatty Acids (FISH OIL) 1000 MG CAPS Take 2 capsules by mouth daily.   Marland Kitchen omeprazole (PRILOSEC) 20 MG capsule Take 20 mg by mouth daily.    . potassium chloride SA (K-DUR,KLOR-CON) 20 MEQ tablet Take 1 tablet by mouth  daily  . pravastatin (PRAVACHOL) 80 MG tablet Take 1 tablet by mouth  daily  . triamterene-hydrochlorothiazide (MAXZIDE-25) 37.5-25 MG tablet Take 1 tablet by mouth  daily  . [DISCONTINUED] ezetimibe (ZETIA) 10 MG tablet Take 1 tablet (10 mg total) by mouth daily.   No facility-administered encounter medications on file as of 02/08/2016.      Review of Systems  Constitutional: Negative.   HENT: Negative.   Eyes: Negative.   Respiratory: Negative.   Cardiovascular: Negative.   Gastrointestinal: Negative.   Endocrine: Negative.  Genitourinary: Negative.   Musculoskeletal: Negative.   Skin: Negative.   Allergic/Immunologic: Negative.   Neurological: Negative.   Hematological: Negative.   Psychiatric/Behavioral: Negative.        Objective:   Physical Exam  Constitutional: She is oriented to person, place, and time. She appears well-developed and well-nourished. No distress.  HENT:  Head: Normocephalic and atraumatic.  Right Ear: External ear normal.  Left Ear: External ear normal.  Nose: Nose normal.  Mouth/Throat: Oropharynx is clear and moist.  Eyes: Conjunctivae and EOM are normal.  Pupils are equal, round, and reactive to light. Right eye exhibits no discharge. Left eye exhibits no discharge. No scleral icterus.  Neck: Normal range of motion. Neck supple. No thyromegaly present.  Cardiovascular: Normal rate, regular rhythm, normal heart sounds and intact distal pulses.   No murmur heard. Heart has a regular rate and rhythm at 72/m  Pulmonary/Chest: Effort normal and breath sounds normal. No respiratory distress. She has no wheezes. She has no rales. She exhibits no tenderness.  Abdominal: Soft. Bowel sounds are normal. She exhibits no mass. There is no tenderness. There is no rebound and no guarding.  No liver or spleen enlargement no masses and no bruits. No abdominal tenderness.  Musculoskeletal: Normal range of motion. She exhibits no edema or tenderness.  Lymphadenopathy:    She has no cervical adenopathy.  Neurological: She is alert and oriented to person, place, and time. She has normal reflexes. No cranial nerve deficit.  Skin: Skin is warm and dry. No rash noted.  Psychiatric: She has a normal mood and affect. Her behavior is normal. Judgment and thought content normal.  Nursing note and vitals reviewed.  BP 115/66 mmHg  Pulse 78  Temp(Src) 97.3 F (36.3 C) (Oral)  Ht 5' (1.524 m)  Wt 148 lb (67.132 kg)  BMI 28.90 kg/m2        Assessment & Plan:  1. Vitamin D deficiency -Vitamin D level was good and the patient will continue with current treatment  2. Hyperlipidemia -All cholesterol numbers were good except the triglycerides were slightly elevated and she will do we'll do better with her diet and no change in treatment  3. Type 2 diabetes mellitus with complication, without long-term current use of insulin (H[facility]) -The hemoglobin A1c was good and she will continue with the metformin and will be aware that it can sometimes play a role with his bowel movements.  4. Essential hypertension -The blood pressure is good today and she will continue with  current treatment  5. Diabetes mellitus due to underlying condition with diabetic autonomic neuropathy, without long-term current use of insulin (H[facility]) -Monitor feet closely and wear shoes that have plenty of wet  6. Type 2 diabetes mellitus with neurological manifestations, controlled ([place]) -She will continue to keep her feet checked regularly and we'll make sure that she purchases shoes that have plenty of wet  7. Hyperlipidemia associated with type 2 diabetes mellitus ([facility]) -Continue with aggressive therapeutic lifestyle changes  Patient Instructions                       Medicare Annual Wellness Visit  [facility] and the medical providers at [facility] strive to bring you the best medical care.  In doing so we not only want to address your current medical conditions and concerns but also to detect new conditions early and prevent illness, disease and health-related problems.    Medicare offers a yearly Wellness Visit  which allows our clinical staff to assess your need for preventative services including immunizations, lifestyle education, counseling to decrease risk of preventable diseases and screening for fall risk and other medical concerns.    This visit is provided free of charge (no copay) for all Medicare recipients. The clinical pharmacists at [facility] have begun to conduct these Wellness Visits which will also include a thorough review of all your medications.    As you primary medical provider recommend that you make an appointment for your Annual Wellness Visit if you have not done so already this year.  You may set up this appointment before you leave today or you may call back [phone] and schedule an appointment.  Please make sure when you call that you mention that you are scheduling your Annual Wellness Visit with the clinical pharmacist so that the appointment may be made for the proper length of time.     Continue  current medications. Continue good therapeutic lifestyle changes which include good diet and exercise. Fall precautions discussed with patient. If an FOBT was given today- please return it to our front desk. If you are over 63 years old - you may need Prevnar 48 or the adult Pneumonia vaccine.  **Flu shots are available--- please call and schedule a FLU-CLINIC appointment**  After your visit with [place] today you will receive a survey in the mail or online from [employer] regarding your care with [place]. Please take a moment to fill this out. Your feedback is very important to [place] as you can help [place] better understand your patient needs as well as improve your experience and satisfaction. WE CARE ABOUT YOU!!!   The patient will try to reduce her caffeine intake She will try some le croix which is a flavored drink that has no caffeine over-the-counter counter at the grocery store. She will continue to keep her feet checked regularly She will follow-up aggressive therapeutic lifestyle changes    Arrie Senate MD

## 2016-02-27 ENCOUNTER — Other Ambulatory Visit: Payer: Self-pay | Admitting: Family Medicine

## 2016-04-15 ENCOUNTER — Other Ambulatory Visit: Payer: Self-pay | Admitting: *Deleted

## 2016-04-15 MED ORDER — METFORMIN HCL 500 MG PO TABS: ORAL_TABLET | ORAL | Status: DC

## 2016-04-15 MED ORDER — GABAPENTIN 100 MG PO CAPS: 300.0000 mg | ORAL_CAPSULE | Freq: Every day | ORAL | Status: DC

## 2016-06-19 ENCOUNTER — Other Ambulatory Visit: Payer: Medicare Other

## 2016-06-19 DIAGNOSIS — E118 Type 2 diabetes mellitus with unspecified complications: Secondary | ICD-10-CM

## 2016-06-19 DIAGNOSIS — I10 Essential (primary) hypertension: Secondary | ICD-10-CM | POA: Diagnosis not present

## 2016-06-19 DIAGNOSIS — E559 Vitamin D deficiency, unspecified: Secondary | ICD-10-CM

## 2016-06-19 DIAGNOSIS — E785 Hyperlipidemia, unspecified: Secondary | ICD-10-CM | POA: Diagnosis not present

## 2016-06-19 LAB — BAYER DCA HB A1C WAIVED: HB A1C (BAYER DCA - WAIVED): 6.6 % (ref <7.0)

## 2016-06-19 NOTE — Patient Instructions (Signed)
Medicare Annual Wellness Visit  [facility] and the medical providers at [facility] strive to bring you the best medical care.  In doing so we not only want to address your current medical conditions and concerns but also to detect new conditions early and prevent illness, disease and health-related problems.    Medicare offers a yearly Wellness Visit which allows our clinical staff to assess your need for preventative services including immunizations, lifestyle education, counseling to decrease risk of preventable diseases and screening for fall risk and other medical concerns.    This visit is provided free of charge (no copay) for all Medicare recipients. The clinical pharmacists at [facility] have begun to conduct these Wellness Visits which will also include a thorough review of all your medications.    As you primary medical provider recommend that you make an appointment for your Annual Wellness Visit if you have not done so already this year.  You may set up this appointment before you leave today or you may call back [phone] and schedule an appointment.  Please make sure when you call that you mention that you are scheduling your Annual Wellness Visit with the clinical pharmacist so that the appointment may be made for the proper length of time.  ,  Continue current medications. Continue good therapeutic lifestyle changes which include good diet and exercise. Fall precautions discussed with patient. If an FOBT was given today- please return it to our front desk. If you are over 59 years old - you may need Prevnar 92 or the adult Pneumonia vaccine.  **Flu shots are available--- please call and schedule a FLU-CLINIC appointment**  After your visit with [place] today you will receive a survey in the mail or online from [employer] regarding your care with [place]. Please take a moment to fill this out. Your feedback is very  important to [place] as you can help [place] better understand your patient needs as well as improve your experience and satisfaction. WE CARE ABOUT YOU!!!

## 2016-06-20 LAB — CBC WITH DIFFERENTIAL/PLATELET
BASOS: 1 %
Basophils Absolute: 0 10*3/uL (ref 0.0–0.2)
EOS (ABSOLUTE): 0.2 10*3/uL (ref 0.0–0.4)
EOS: 3 %
Hematocrit: 36.7 % (ref 34.0–46.6)
Hemoglobin: 12.4 g/dL (ref 11.1–15.9)
IMMATURE GRANULOCYTES: 1 %
Immature Grans (Abs): 0 10*3/uL (ref 0.0–0.1)
LYMPHS ABS: 2.4 10*3/uL (ref 0.7–3.1)
Lymphs: 41 %
MCH: 31.3 pg (ref 26.6–33.0)
MCHC: 33.8 g/dL (ref 31.5–35.7)
MCV: 93 fL (ref 79–97)
MONOS ABS: 0.5 10*3/uL (ref 0.1–0.9)
Monocytes: 8 %
Neutrophils Absolute: 2.7 10*3/uL (ref 1.4–7.0)
Neutrophils: 46 %
PLATELETS: 302 10*3/uL (ref 150–379)
RBC: 3.96 x10E6/uL (ref 3.77–5.28)
RDW: 13 % (ref 12.3–15.4)
WBC: 5.8 10*3/uL (ref 3.4–10.8)

## 2016-06-20 LAB — HEPATIC FUNCTION PANEL
ALBUMIN: 4.6 g/dL (ref 3.5–4.7)
ALK PHOS: 71 IU/L (ref 39–117)
ALT: 21 IU/L (ref 0–32)
AST: 19 IU/L (ref 0–40)
Bilirubin Total: 0.6 mg/dL (ref 0.0–1.2)
Bilirubin, Direct: 0.18 mg/dL (ref 0.00–0.40)
Total Protein: 6.7 g/dL (ref 6.0–8.5)

## 2016-06-20 LAB — LIPID PANEL
CHOLESTEROL TOTAL: 160 mg/dL (ref 100–199)
Chol/HDL Ratio: 2.7 ratio units (ref 0.0–4.4)
HDL: 60 mg/dL (ref >39)
LDL Calculated: 77 mg/dL (ref 0–99)
TRIGLYCERIDES: 116 mg/dL (ref 0–149)
VLDL Cholesterol Cal: 23 mg/dL (ref 5–40)

## 2016-06-20 LAB — BMP8+EGFR
BUN / CREAT RATIO: 15 (ref 12–28)
BUN: 11 mg/dL (ref 8–27)
CO2: 27 mmol/L (ref 18–29)
CREATININE: 0.71 mg/dL (ref 0.57–1.00)
Calcium: 10.2 mg/dL (ref 8.7–10.3)
Chloride: 94 mmol/L — ABNORMAL LOW (ref 96–106)
GFR calc Af Amer: 92 mL/min/{1.73_m2} (ref >59)
GFR, EST NON AFRICAN AMERICAN: 80 mL/min/{1.73_m2} (ref >59)
Glucose: 121 mg/dL — ABNORMAL HIGH (ref 65–99)
Potassium: 3.8 mmol/L (ref 3.5–5.2)
SODIUM: 137 mmol/L (ref 134–144)

## 2016-06-20 LAB — VITAMIN D 25 HYDROXY (VIT D DEFICIENCY, FRACTURES): VIT D 25 HYDROXY: 42.4 ng/mL (ref 30.0–100.0)

## 2016-07-02 ENCOUNTER — Ambulatory Visit (INDEPENDENT_AMBULATORY_CARE_PROVIDER_SITE_OTHER): Payer: Medicare Other

## 2016-07-02 ENCOUNTER — Encounter: Payer: Self-pay | Admitting: Family Medicine

## 2016-07-02 ENCOUNTER — Ambulatory Visit (INDEPENDENT_AMBULATORY_CARE_PROVIDER_SITE_OTHER): Payer: Medicare Other | Admitting: Family Medicine

## 2016-07-02 VITALS — BP 139/65 | HR 73 | Temp 97.5°F | Ht 60.0 in | Wt 149.0 lb

## 2016-07-02 DIAGNOSIS — E118 Type 2 diabetes mellitus with unspecified complications: Secondary | ICD-10-CM

## 2016-07-02 DIAGNOSIS — Z23 Encounter for immunization: Secondary | ICD-10-CM | POA: Diagnosis not present

## 2016-07-02 DIAGNOSIS — I10 Essential (primary) hypertension: Secondary | ICD-10-CM

## 2016-07-02 DIAGNOSIS — E785 Hyperlipidemia, unspecified: Secondary | ICD-10-CM

## 2016-07-02 DIAGNOSIS — E559 Vitamin D deficiency, unspecified: Secondary | ICD-10-CM | POA: Diagnosis not present

## 2016-07-02 NOTE — Progress Notes (Signed)
Subjective:    Patient ID: Theresa Watson, female    DOB: 1932/10/24, 82 y.o.   MRN: [id number]  HPI Pt here for follow up and management of chronic medical problems which includes diabetes, hyperlipidemia, and hypertension. She is taking medications regularly.Family history was reviewed. The patient has no specific complaints. She will give [place] a urine microalbumin. She will also get a chest x-ray today and her flu shot. The lab work is been returned and this will be reviewed with her during the visit. Most importantly her A1c was good at 6.6%. Renal function was good and the CBC was normal. All liver function test vitamin D and cholesterol numbers were good and she should continue with the pravastatin. The patient brings in some outside blood pressures and blood sugars for review and the majority of these are good. They will be scanned into the record. The patient denies any chest pain palpitations or shortness of breath. She denies any problems with the stomach and does have occasional loose bowel movements and then normal bowel movements. She attributes this to her diverticulosis. She does not see any blood in the stool or has any black tarry bowel movements. She is passing her water well. She last saw the eye doctor in February and had cataract surgery at that time and it is seeing better. She goes once a year to the eye doctor. She is the only family member still living. Her sister has died. Patient has a positive attitude.    Patient Active Problem List   Diagnosis Date Noted  . Diabetes mellitus due to underlying condition with diabetic autonomic neuropathy, without long-term current use of insulin ([facility]) 09/18/2015  . Annual physical exam 02/26/2015  . Type 2 diabetes mellitus with neurological manifestations, controlled ([facility]) 08/29/2013  . Hypertension 11/13/2012  . Cataracts, bilateral 11/13/2012  . Symptomatic menopausal or female climacteric states   . Hyperlipidemia associated with  type 2 diabetes mellitus ([place])   . Esophagitis   . 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 Obesity, mild   . IBS (irritable bowel syndrome)   . Kidney stone   . Lumbosacral spondylosis without myelopathy   . Pain in joint, shoulder region   . Colon polyp   . Diverticulosis    Outpatient Encounter Prescriptions as of 07/02/2016  Medication Sig  . aspirin 81 MG EC tablet Take 81 mg by mouth daily.    . Cholecalciferol (VITAMIN D3) 2000 UNITS TABS Take 1 tablet by mouth daily.    . ferrous sulfate 325 (65 FE) MG EC tablet Take 325 mg by mouth daily with breakfast.    . gabapentin (NEURONTIN) 100 MG capsule Take 3 capsules (300 mg total) by mouth at bedtime.  . Garlic 123XX123 MG TABS Take 1 tablet by mouth 2 (two) times daily.   Marland Kitchen geriatric multivitamins-minerals (ELDERTONIC/GEVRABON) ELIX Take 15 mLs by mouth daily.    Marland Kitchen glucose blood (ACCU-CHEK AVIVA PLUS) test strip Test twice daily     E11.43  . metFORMIN (GLUCOPHAGE) 500 MG tablet Take 1 tablet by mouth two  times daily with meals  . Omega-3 Fatty Acids (FISH OIL) 1000 MG CAPS Take 2 capsules by mouth daily.   Marland Kitchen omeprazole (PRILOSEC) 20 MG capsule Take 20 mg by mouth daily.    . potassium chloride SA (K-DUR,KLOR-CON) 20 MEQ tablet Take 1 tablet by mouth  daily  . pravastatin (PRAVACHOL) 80 MG tablet Take 1 tablet by mouth  daily  .  triamterene-hydrochlorothiazide (MAXZIDE-25) 37.5-25 MG tablet Take 1 tablet by mouth  daily   No facility-administered encounter medications on file as of 07/02/2016.       Review of Systems  Constitutional: Negative.   HENT: Negative.   Eyes: Negative.   Respiratory: Negative.   Cardiovascular: Negative.   Gastrointestinal: Negative.   Endocrine: Negative.   Genitourinary: Negative.   Musculoskeletal: Negative.   Skin: Negative.   Allergic/Immunologic: Negative.   Neurological: Negative.   Hematological: Negative.   Psychiatric/Behavioral: Negative.        Objective:     Physical Exam  Constitutional: She is oriented to person, place, and time. She appears well-developed and well-nourished.  The patient is pleasant and alert and has a positive attitude. Her children check on her regularly.  HENT:  Head: Normocephalic and atraumatic.  Right Ear: External ear normal.  Nose: Nose normal.  Mouth/Throat: Oropharynx is clear and moist.  Minimal cerumen left ear canal  Eyes: Conjunctivae and EOM are normal. Pupils are equal, round, and reactive to light. Right eye exhibits no discharge. Left eye exhibits no discharge. No scleral icterus.  Neck: Normal range of motion. Neck supple. No thyromegaly present.  No bruits thyromegaly or anterior cervical adenopathy  Cardiovascular: Normal rate, regular rhythm, normal heart sounds and intact distal pulses.   No murmur heard. The heart has a regular rate and rhythm today at 60/m  Pulmonary/Chest: Effort normal and breath sounds normal. No respiratory distress. She has no wheezes. She has no rales.  Clear anteriorly and posteriorly  Abdominal: Soft. Bowel sounds are normal. She exhibits no mass. There is no tenderness. There is no rebound and no guarding.  No abdominal tenderness masses or bruits or organ enlargement  Musculoskeletal: Normal range of motion. She exhibits no edema.  Lymphadenopathy:    She has no cervical adenopathy.  Neurological: She is alert and oriented to person, place, and time. She has normal reflexes. No cranial nerve deficit.  Skin: Skin is warm and dry. No rash noted.  Psychiatric: She has a normal mood and affect. Her behavior is normal. Judgment and thought content normal.  Nursing note and vitals reviewed.  BP 139/65 (BP Location: Left Arm)   Pulse 73   Temp 97.5 F (36.4 C) (Oral)   Ht 5' (1.524 m)   Wt 149 lb (67.6 kg)   BMI 29.10 kg/m   [facility] reading (PRIMARY) by  Dr. Brunilda Payor x-ray with results pending                                       Assessment & Plan:  1. Vitamin  D deficiency -Continue current treatment  2. Hyperlipidemia -Cholesterol numbers were good and she will continue with current treatment and therapeutic lifestyle changes - DG Chest 2 View; Future  3. Essential hypertension -The blood pressure is good today and she will continue with current treatment - DG Chest 2 View; Future  4. Type 2 diabetes mellitus with complication, without long-term current use of insulin (HCC) -The hemoglobin A1c was good and she will continue with her current treatment - Microalbumin / creatinine urine ratio   Patient Instructions                       Medicare Annual Wellness Visit  [facility] and the medical providers at [facility] strive to bring you the best medical  care.  In doing so we not only want to address your current medical conditions and concerns but also to detect new conditions early and prevent illness, disease and health-related problems.    Medicare offers a yearly Wellness Visit which allows our clinical staff to assess your need for preventative services including immunizations, lifestyle education, counseling to decrease risk of preventable diseases and screening for fall risk and other medical concerns.    This visit is provided free of charge (no copay) for all Medicare recipients. The clinical pharmacists at [facility] have begun to conduct these Wellness Visits which will also include a thorough review of all your medications.    As you primary medical provider recommend that you make an appointment for your Annual Wellness Visit if you have not done so already this year.  You may set up this appointment before you leave today or you may call back [phone] and schedule an appointment.  Please make sure when you call that you mention that you are scheduling your Annual Wellness Visit with the clinical pharmacist so that the appointment may be made for the proper length of time.      Continue current medications. Continue good therapeutic lifestyle changes which include good diet and exercise. Fall precautions discussed with patient. If an FOBT was given today- please return it to our front desk. If you are over 7 years old - you may need Prevnar 20 or the adult Pneumonia vaccine.  **Flu shots are available--- please call and schedule a FLU-CLINIC appointment**  After your visit with [place] today you will receive a survey in the mail or online from [employer] regarding your care with [place]. Please take a moment to fill this out. Your feedback is very important to [place] as you can help [place] better understand your patient needs as well as improve your experience and satisfaction. WE CARE ABOUT YOU!!!   Stay active and drink plenty of fluids this winter Continue to be careful and did not put yourself at risk for falling Continue to monitor blood sugars and blood pressures and bring these readings in for review at each visit Check feet daily   Arrie Senate MD

## 2016-07-02 NOTE — Patient Instructions (Addendum)
Medicare Annual Wellness Visit  [facility] and the medical providers at [facility] strive to bring you the best medical care.  In doing so we not only want to address your current medical conditions and concerns but also to detect new conditions early and prevent illness, disease and health-related problems.    Medicare offers a yearly Wellness Visit which allows our clinical staff to assess your need for preventative services including immunizations, lifestyle education, counseling to decrease risk of preventable diseases and screening for fall risk and other medical concerns.    This visit is provided free of charge (no copay) for all Medicare recipients. The clinical pharmacists at [facility] have begun to conduct these Wellness Visits which will also include a thorough review of all your medications.    As you primary medical provider recommend that you make an appointment for your Annual Wellness Visit if you have not done so already this year.  You may set up this appointment before you leave today or you may call back [phone] and schedule an appointment.  Please make sure when you call that you mention that you are scheduling your Annual Wellness Visit with the clinical pharmacist so that the appointment may be made for the proper length of time.     Continue current medications. Continue good therapeutic lifestyle changes which include good diet and exercise. Fall precautions discussed with patient. If an FOBT was given today- please return it to our front desk. If you are over 29 years old - you may need Prevnar 62 or the adult Pneumonia vaccine.  **Flu shots are available--- please call and schedule a FLU-CLINIC appointment**  After your visit with [place] today you will receive a survey in the mail or online from [employer] regarding your care with [place]. Please take a moment to fill this out. Your feedback is very  important to [place] as you can help [place] better understand your patient needs as well as improve your experience and satisfaction. WE CARE ABOUT YOU!!!   Stay active and drink plenty of fluids this winter Continue to be careful and did not put yourself at risk for falling Continue to monitor blood sugars and blood pressures and bring these readings in for review at each visit Check feet daily

## 2016-07-03 ENCOUNTER — Other Ambulatory Visit: Payer: Self-pay | Admitting: Family Medicine

## 2016-07-03 LAB — MICROALBUMIN / CREATININE URINE RATIO
CREATININE, UR: 25.9 mg/dL
MICROALB/CREAT RATIO: 71.8 mg/g creat — ABNORMAL HIGH (ref 0.0–30.0)
MICROALBUM., U, RANDOM: 18.6 ug/mL

## 2016-08-27 ENCOUNTER — Encounter: Payer: Self-pay | Admitting: Physician Assistant

## 2016-08-27 ENCOUNTER — Ambulatory Visit (INDEPENDENT_AMBULATORY_CARE_PROVIDER_SITE_OTHER): Payer: Medicare Other | Admitting: Physician Assistant

## 2016-08-27 VITALS — BP 129/62 | HR 79 | Temp 96.9°F | Ht 60.0 in | Wt 145.0 lb

## 2016-08-27 DIAGNOSIS — K5732 Diverticulitis of large intestine without perforation or abscess without bleeding: Secondary | ICD-10-CM

## 2016-08-27 MED ORDER — SULFAMETHOXAZOLE-TRIMETHOPRIM 800-160 MG PO TABS: 1.0000 | ORAL_TABLET | Freq: Two times a day (BID) | ORAL | 0 refills | Status: DC

## 2016-08-27 NOTE — Patient Instructions (Signed)

## 2016-08-27 NOTE — Progress Notes (Signed)
BP 129/62   Pulse 79   Temp (!) 96.9 F (36.1 C) (Oral)   Ht 5' (1.524 m)   Wt 145 lb (65.8 kg)   BMI 28.32 kg/m    Subjective:    Patient ID: Theresa Watson, female    DOB: 1933-04-03, 82 y.o.   MRN: [id number]  HPI: Theresa Watson is a 82 y.o. female presenting on 08/27/2016 for Diarrhea and gas (has been taking Immodium , has history of diverticulitis) and Abdominal pain and bloating  She has only been taking 1 or 2 Imodium per day without much. In the past day she has had significant worsening of the abdominal cramping and pain in the left upper and lower quadrant. She denies any blood. It feels the same as when she had a previous diverticulitis. She has had a reaction to Levaquin and so we will avoid any quinolone.  Past Medical History:  Diagnosis Date  . Colon polyp   . Diabetes mellitus without complication ([place])   . Diverticulosis   . Esophagitis   . IBS (irritable bowel syndrome)   . Kidney stone   . Lumbosacral spondylosis without myelopathy   . Obesity, mild   . Other and unspecified hyperlipidemia   . Pain in joint, shoulder region   . 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 Symptomatic menopausal or female climacteric states    Relevant past medical, surgical, family and social history reviewed and updated as indicated. Interim medical history since our last visit reviewed. Allergies and medications reviewed and updated. DATA REVIEWED: CHART IN EPIC  Social History   Social History  . Marital status: Divorced    Spouse name: N/A  . Number of children: N/A  . Years of education: N/A   Occupational History  . retired    Social History Main Topics  . Smoking status: Former Smoker    Packs/day: 0.50    Types: Cigarettes    Start date: 10/06/1953    Quit date: 02/04/2012  . Smokeless tobacco: Never Used  . Alcohol use No  . Drug use: No  . Sexual activity: Not on file   Other Topics Concern  . Not on file   Social  History Narrative  . No narrative on file    Past Surgical History:  Procedure Laterality Date  . CARPAL TUNNEL RELEASE    . CHOLECYSTECTOMY    . LIPOMA EXCISION    . VAGINAL HYSTERECTOMY      Family History  Problem Relation Age of Onset  . Cancer Mother   . Brain cancer Mother   . GI Bleed Father   . Arthritis Sister   . Lymphoma Sister   . Pancreatic cancer Brother     Review of Systems  Constitutional: Positive for activity change, appetite change and fatigue. Negative for fever.  HENT: Negative.   Eyes: Negative.   Respiratory: Negative.  Negative for shortness of breath and wheezing.   Cardiovascular: Negative for chest pain and palpitations.  Gastrointestinal: Positive for abdominal distention, abdominal pain and diarrhea. Negative for anal bleeding, blood in stool, constipation, nausea, rectal pain and vomiting.  Genitourinary: Negative.  Negative for decreased urine volume, difficulty urinating, frequency and hematuria.  Musculoskeletal: Negative.   Skin: Negative.   Neurological: Negative.   Hematological: Negative.       Medication List       Accurate as of 08/27/16 11:09 PM. Always use your most recent med list.  aspirin 81 MG EC tablet Take 81 mg by mouth daily.   ferrous sulfate 325 (65 FE) MG EC tablet Take 325 mg by mouth daily with breakfast.   Fish Oil 1000 MG Caps Take 2 capsules by mouth daily.   gabapentin 100 MG capsule Commonly known as:  NEURONTIN Take 3 capsules (300 mg total) by mouth at bedtime.   Garlic 123XX123 MG Tabs Take 1 tablet by mouth 2 (two) times daily.   geriatric multivitamins-minerals Elix Take 15 mLs by mouth daily.   glucose blood test strip Commonly known as:  ACCU-CHEK AVIVA PLUS Test twice daily     E11.43   metFORMIN 500 MG tablet Commonly known as:  GLUCOPHAGE Take 1 tablet by mouth two  times daily with meals   omeprazole 20 MG capsule Commonly known as:  PRILOSEC Take 20 mg by mouth daily.    potassium chloride SA 20 MEQ tablet Commonly known as:  K-DUR,KLOR-CON TAKE 1 TABLET BY MOUTH  DAILY   pravastatin 80 MG tablet Commonly known as:  PRAVACHOL TAKE 1 TABLET BY MOUTH  DAILY   sulfamethoxazole-trimethoprim 800-160 MG tablet Commonly known as:  BACTRIM DS Take 1 tablet by mouth 2 (two) times daily.   triamterene-hydrochlorothiazide 37.5-25 MG tablet Commonly known as:  MAXZIDE-25 TAKE 1 TABLET BY MOUTH  DAILY   Vitamin D3 2000 units Tabs Take 1 tablet by mouth daily.          Objective:    BP 129/62   Pulse 79   Temp (!) 96.9 F (36.1 C) (Oral)   Ht 5' (1.524 m)   Wt 145 lb (65.8 kg)   BMI 28.32 kg/m   Allergies  Allergen Reactions  . Levaquin [Levofloxacin] Swelling    tongue  . Cephalexin   . Penicillins   . Lisinopril Cough    Wt Readings from Last 3 Encounters:  08/27/16 145 lb (65.8 kg)  07/02/16 149 lb (67.6 kg)  02/08/16 148 lb (67.1 kg)    Physical Exam  Constitutional: She is oriented to person, place, and time. She appears well-developed and well-nourished.  HENT:  Head: Normocephalic and atraumatic.  Eyes: Conjunctivae and EOM are normal. Pupils are equal, round, and reactive to light.  Cardiovascular: Normal rate, regular rhythm, normal heart sounds and intact distal pulses.   Pulmonary/Chest: Effort normal and breath sounds normal.  Abdominal: She exhibits distension. She exhibits no fluid wave and no mass. Bowel sounds are increased. There is tenderness in the left upper quadrant and left lower quadrant. There is no rigidity, no rebound, no guarding, no CVA tenderness and negative Murphy's sign.  Neurological: She is alert and oriented to person, place, and time. She has normal reflexes.  Skin: Skin is warm and dry. No rash noted. She is not diaphoretic.  Psychiatric: She has a normal mood and affect. Her behavior is normal. Judgment and thought content normal.        Assessment & Plan:   1. Diverticulitis of colon -  sulfamethoxazole-trimethoprim (BACTRIM DS) 800-160 MG tablet; Take 1 tablet by mouth 2 (two) times daily.  Dispense: 28 tablet; Refill: 0 If there is any change and fever, diarrhea, or mucus go to emergency room as soon as possible. She may also use Imodium up to 6 per day for the abdominal cramping.  Continue all other maintenance medications as listed above.  Follow up plan: Follow-up as needed or worsening of symptoms. Call office for any issues.  Educational handout given for diverticulitis  [employer]  Adah Salvage PA-C [facility] [address]  [place], [place] [address] [phone]   08/27/2016, 11:09 PM

## 2016-09-01 ENCOUNTER — Telehealth: Payer: Self-pay | Admitting: Family Medicine

## 2016-09-01 MED ORDER — METRONIDAZOLE 500 MG PO TABS: 500.0000 mg | ORAL_TABLET | Freq: Three times a day (TID) | ORAL | 0 refills | Status: DC

## 2016-09-01 NOTE — Addendum Note (Signed)
Addended by: Thana Ates on: 09/01/2016 01:32 PM   Modules accepted: Orders

## 2016-09-01 NOTE — Telephone Encounter (Signed)
I have added flagyl to bactrim.   Please keep a low threshold for return if symptoms worsen. Pt will likely need to be seen again if not improving very quickly.   Please get help right away if not tolerating fluids.   Laroy Apple, MD [facility] 09/01/2016, 12:09 PM

## 2016-09-01 NOTE — Telephone Encounter (Signed)
Joy aware of recommendations.

## 2016-09-01 NOTE — Telephone Encounter (Signed)
Patient has still having diarrhea and this is going on for 5 weeks. She is still on the Bactrim. She is also taking immodium for the diarrhea and it is not helping. Please advise.

## 2016-09-03 ENCOUNTER — Encounter: Payer: Self-pay | Admitting: Pediatrics

## 2016-09-03 ENCOUNTER — Ambulatory Visit (INDEPENDENT_AMBULATORY_CARE_PROVIDER_SITE_OTHER): Payer: Medicare Other | Admitting: Pediatrics

## 2016-09-03 ENCOUNTER — Ambulatory Visit (HOSPITAL_COMMUNITY)
Admission: RE | Admit: 2016-09-03 | Discharge: 2016-09-03 | Disposition: A | Discharge status: Home / Self Care | Payer: Medicare Other | Source: Ambulatory Visit | Attending: Pediatrics | Admitting: Pediatrics

## 2016-09-03 VITALS — BP 137/73 | HR 74 | Temp 96.9°F | Ht 60.0 in | Wt 143.8 lb

## 2016-09-03 DIAGNOSIS — N8110 Cystocele, unspecified: Secondary | ICD-10-CM | POA: Diagnosis not present

## 2016-09-03 DIAGNOSIS — I700 Atherosclerosis of aorta: Secondary | ICD-10-CM | POA: Insufficient documentation

## 2016-09-03 DIAGNOSIS — R1032 Left lower quadrant pain: Secondary | ICD-10-CM | POA: Insufficient documentation

## 2016-09-03 DIAGNOSIS — K449 Diaphragmatic hernia without obstruction or gangrene: Secondary | ICD-10-CM | POA: Insufficient documentation

## 2016-09-03 DIAGNOSIS — Z9049 Acquired absence of other specified parts of digestive tract: Secondary | ICD-10-CM | POA: Diagnosis not present

## 2016-09-03 LAB — POCT I-STAT CREATININE: CREATININE: 1.1 mg/dL — AB (ref 0.44–1.00)

## 2016-09-03 MED ORDER — IOPAMIDOL (ISOVUE-300) INJECTION 61%
100.0000 mL | Freq: Once | INTRAVENOUS | Status: AC | PRN
  Administered 2016-09-03: 100 mL via INTRAVENOUS

## 2016-09-03 NOTE — Progress Notes (Addendum)
Subjective:   Patient ID: Theresa Watson, female    DOB: Aug 14, 1933, 82 y.o.   MRN: [id number] CC: Diarrhea; Fatigue; No Appetite; Abdominal Pain (6 out of 10); Gas; and Bloated  HPI: Theresa Watson is a 82 y.o. female presenting for Diarrhea; Fatigue; No Appetite; Abdominal Pain (6 out of 10); Gas; and Bloated  Seen one week ago with diarrhea, abd pain similar to prior episodes of diverticulitis Started on bactrim Still with diarrhea two days ago, called in and metronidazole was added to abx Here today with continuing symptoms Went one day without stooling since this began Other days has had 3-4 bowel movements This morning has had 3 very watery stools No blood in stool Regular abdominal discomfort, only sometimes has pain LLQ, no pain right now, thinks it has happened at some point every day Symptoms are making her feel very uncomfortbable Denies fevers Has been belching a lot Minimal appetite Family has been encouraging her to drink more No vomiting Taking immodium sometimes once a day Urinating regularly every few hours Has not noticed any difference with the antibiotics Thinks her abd is getting bigger, relative commented on it looking like she I pregnant She is not sure if her abd is bigger or not, usually wears a girdle   Relevant past medical, surgical, family and social history reviewed. Allergies and medications reviewed and updated. History  Smoking Status  . Former Smoker  . Packs/day: 0.50  . Types: Cigarettes  . Start date: 10/06/1953  . Quit date: 02/04/2012  Smokeless Tobacco  . Never Used   ROS: Per HPI   Objective:    BP 137/73   Pulse 74   Temp (!) 96.9 F (36.1 C) (Oral)   Ht 5' (1.524 m)   Wt 143 lb 12.8 oz (65.2 kg)   BMI 28.08 kg/m   Wt Readings from Last 3 Encounters:  09/03/16 143 lb 12.8 oz (65.2 kg)  08/27/16 145 lb (65.8 kg)  07/02/16 149 lb (67.6 kg)   Gen:  alert, cooperative with exam, moves around constantly on exam table,  shifting weight to one hip or other, leaning back, standing up adjusting then sitting back down ENT:   OP without erythema, MMM LYMPH: no cervical LAD CV: NRRR, normal S1/S2, no murmur, WWP Resp: CTABL, no wheezes, normal WOB Abd: +BS, soft, mildly distended, "uncomfortable" with palpation lower bad, TTP LLQ, no rebound, no peritoneal signs, no guarding or organomegaly Ext: No edema, warm Neuro: Alert and oriented, strength equal b/l UE and LE, coordination grossly normal  Assessment & Plan:  Theresa Watson was seen today for diarrhea, fatigue, no appetite, abdominal pain, gas and bloated.  Diagnoses and all orders for this visit:  Abdominal pain, left lower quadrant Abd exam with mild discomfort LLQ, pt appears uncomfortable, moving constantly when not examined 6 lb weight loss from appt 2 mo ago prior to beginning of symptoms Will get labs as below Given length of time of symptoms, similarity to previous episodes of diverticulitis but with no improvement with antibiotics and ongoing abd pain will get CT scan of abd Also will test stool for pathogens given diarrhea No epigastric pain but ongoing belching and reflux symptoms that is out of the ordinary for her Granddaughter recently with H. Pylori, will test. Cont bland diet, increase fluid intake, well hydrated on exam, minimize immodium use until we find a cause for diarrhea, continue antibiotics as prescribed -     CBC with Differential/Platelet -     CMP14+EGFR -  H Pylori, IGM, IGG, IGA AB -     Cdiff NAA+O+P+Stool Culture -     CT Abdomen Pelvis W Contrast; Future   Follow up plan: 1 week Assunta Found, MD [place]  ADDENDUM 12/1: CT scan normal, no signs of diverticulitis. Pt stopping antibiotics. Labs unconcerning, no H. Pylori, normal WBC. With slight elevation in Cr, possible she is slightly dehydrated. Spoke with pt's granddaughter who was present at office visit. They had stopped her  metformin, pt said she had been very concerned about possibly having cancer, her mom with unknown kind of cancer, sister with brain cancer, brother with "prostate/spleen" cancer. Not feeling as concerned about possible cancer now. Dropped off stool samples yesterday. Stools still loose, has decreased to once a day. Granddaughter wants to talk about starting "something for her nerves:Marland Kitchen Will RTC next week, likely start low dose SSRI. Given age will avoid benzodiazepines.

## 2016-09-04 ENCOUNTER — Other Ambulatory Visit: Payer: Medicare Other

## 2016-09-04 DIAGNOSIS — R1032 Left lower quadrant pain: Secondary | ICD-10-CM | POA: Diagnosis not present

## 2016-09-04 LAB — CBC WITH DIFFERENTIAL/PLATELET
BASOS ABS: 0 10*3/uL (ref 0.0–0.2)
Basos: 1 %
EOS (ABSOLUTE): 0.1 10*3/uL (ref 0.0–0.4)
Eos: 2 %
HEMOGLOBIN: 11.5 g/dL (ref 11.1–15.9)
Hematocrit: 34 % (ref 34.0–46.6)
IMMATURE GRANS (ABS): 0 10*3/uL (ref 0.0–0.1)
IMMATURE GRANULOCYTES: 0 %
LYMPHS: 39 %
Lymphocytes Absolute: 2.5 10*3/uL (ref 0.7–3.1)
MCH: 31.2 pg (ref 26.6–33.0)
MCHC: 33.8 g/dL (ref 31.5–35.7)
MCV: 92 fL (ref 79–97)
MONOCYTES: 8 %
Monocytes Absolute: 0.5 10*3/uL (ref 0.1–0.9)
NEUTROS ABS: 3.3 10*3/uL (ref 1.4–7.0)
NEUTROS PCT: 50 %
PLATELETS: 348 10*3/uL (ref 150–379)
RBC: 3.69 x10E6/uL — AB (ref 3.77–5.28)
RDW: 12.4 % (ref 12.3–15.4)
WBC: 6.4 10*3/uL (ref 3.4–10.8)

## 2016-09-04 LAB — CMP14+EGFR
ALBUMIN: 4.3 g/dL (ref 3.5–4.7)
ALT: 16 IU/L (ref 0–32)
AST: 19 IU/L (ref 0–40)
Albumin/Globulin Ratio: 1.9 (ref 1.2–2.2)
Alkaline Phosphatase: 68 IU/L (ref 39–117)
BUN / CREAT RATIO: 11 — AB (ref 12–28)
BUN: 12 mg/dL (ref 8–27)
Bilirubin Total: <0.2 mg/dL (ref 0.0–1.2)
CALCIUM: 10.4 mg/dL — AB (ref 8.7–10.3)
CHLORIDE: 99 mmol/L (ref 96–106)
CO2: 25 mmol/L (ref 18–29)
CREATININE: 1.12 mg/dL — AB (ref 0.57–1.00)
GFR, EST AFRICAN AMERICAN: 53 mL/min/{1.73_m2} — AB (ref >59)
GFR, EST NON AFRICAN AMERICAN: 46 mL/min/{1.73_m2} — AB (ref >59)
GLUCOSE: 93 mg/dL (ref 65–99)
Globulin, Total: 2.3 g/dL (ref 1.5–4.5)
Potassium: 3.8 mmol/L (ref 3.5–5.2)
Sodium: 139 mmol/L (ref 134–144)
TOTAL PROTEIN: 6.6 g/dL (ref 6.0–8.5)

## 2016-09-04 LAB — H PYLORI, IGM, IGG, IGA AB
H Pylori IgG: <0.9 U/mL (ref 0.0–0.8)
H. pylori, IgA Abs: <9 units (ref 0.0–8.9)

## 2016-09-08 LAB — CDIFF NAA+O+P+STOOL CULTURE
E coli, Shiga toxin Assay: NEGATIVE
Toxigenic C. Difficile by PCR: NEGATIVE

## 2016-09-15 ENCOUNTER — Encounter: Payer: Self-pay | Admitting: Pediatrics

## 2016-09-15 ENCOUNTER — Ambulatory Visit (INDEPENDENT_AMBULATORY_CARE_PROVIDER_SITE_OTHER): Payer: Medicare Other | Admitting: Pediatrics

## 2016-09-15 VITALS — BP 128/74 | HR 66 | Temp 97.0°F | Ht 60.0 in | Wt 144.2 lb

## 2016-09-15 DIAGNOSIS — F439 Reaction to severe stress, unspecified: Secondary | ICD-10-CM | POA: Diagnosis not present

## 2016-09-15 DIAGNOSIS — I10 Essential (primary) hypertension: Secondary | ICD-10-CM

## 2016-09-15 DIAGNOSIS — E1149 Type 2 diabetes mellitus with other diabetic neurological complication: Secondary | ICD-10-CM

## 2016-09-15 DIAGNOSIS — R1084 Generalized abdominal pain: Secondary | ICD-10-CM | POA: Diagnosis not present

## 2016-09-15 DIAGNOSIS — R195 Other fecal abnormalities: Secondary | ICD-10-CM

## 2016-09-15 NOTE — Progress Notes (Signed)
  Subjective:   Patient ID: Theresa Watson, female    DOB: [birth year], 82 y.o.   MRN: [id number] CC: Follow-up nerves HPI: Theresa Watson is a 82 y.o. female presenting for Follow-up  Feels much better since CT scan without masses Daughter says more emotional over past months Pt says she does have some ups and downs in mood, in general she thinks she can control it just fine Does not want to be on a medication for it Stopped metformin when she had the CT scan done Diarrhea much improved after that Same time she learned that she did not have any large masses in abd, her other concern Also stopped antibiotics after no diverticulitis on CT scan  Continues to burp regularly Drinks about one diet Dr Malachi Bonds a day  DM2: BGLs mostly low 100s None over 200  Relevant past medical, surgical, family and social history reviewed. Allergies and medications reviewed and updated. History  Smoking Status  . Former Smoker  . Packs/day: 0.50  . Types: Cigarettes  . Start date: 10/06/1953  . Quit date: 02/04/2012  Smokeless Tobacco  . Never Used   ROS: Per HPI   Objective:    BP 128/74   Pulse 66   Temp 97 F (36.1 C) (Oral)   Ht 5' (1.524 m)   Wt 144 lb 3.2 oz (65.4 kg)   BMI 28.16 kg/m   Wt Readings from Last 3 Encounters:  09/15/16 144 lb 3.2 oz (65.4 kg)  09/03/16 143 lb 12.8 oz (65.2 kg)  08/27/16 145 lb (65.8 kg)    Gen: NAD, alert, cooperative with exam, NCAT EYES: EOMI, no conjunctival injection, or no icterus CV: NRRR, normal S1/S2 Resp: CTABL, no wheezes, normal WOB Abd: +BS, soft, NT, mildly distended. no guarding or organomegaly Ext: No edema, warm Neuro: Alert and oriented, strength equal b/l UE and LE, coordination grossly normal MSK: normal muscle bulk  Assessment & Plan:  Theresa Watson was seen today for follow-up multiple med problems.  Diagnoses and all orders for this visit:  Essential hypertension Well controlled, cont meds  Type 2 diabetes mellitus with  neurological manifestations, controlled ([place]) Cont diet control, hold metformin, caused loose stools  Generalized abdominal pain Much improved Pt and granddaughter think discomfort was mostly due to stress and worry CT scan normal  Loose stools Improved since stopping metformin A1c under 7 last check Repeat next scheduled office visit  Stress Pt feels less stressed now Happens rarely Does not want to be started on daily medication Discussed stress relief techniques Let us know if worsening  Follow up plan: As scheduled with PCP Assunta Found, MD [place]

## 2016-10-15 ENCOUNTER — Other Ambulatory Visit: Payer: Self-pay | Admitting: Family Medicine

## 2016-11-10 DIAGNOSIS — H40033 Anatomical narrow angle, bilateral: Secondary | ICD-10-CM | POA: Diagnosis not present

## 2016-11-10 DIAGNOSIS — G44219 Episodic tension-type headache, not intractable: Secondary | ICD-10-CM | POA: Diagnosis not present

## 2016-11-12 ENCOUNTER — Encounter: Payer: Self-pay | Admitting: Family Medicine

## 2016-11-12 ENCOUNTER — Ambulatory Visit (INDEPENDENT_AMBULATORY_CARE_PROVIDER_SITE_OTHER): Payer: Medicare Other | Admitting: Family Medicine

## 2016-11-12 VITALS — BP 125/71 | HR 69 | Temp 96.7°F | Ht 60.0 in | Wt 143.0 lb

## 2016-11-12 DIAGNOSIS — E1149 Type 2 diabetes mellitus with other diabetic neurological complication: Secondary | ICD-10-CM | POA: Diagnosis not present

## 2016-11-12 DIAGNOSIS — I10 Essential (primary) hypertension: Secondary | ICD-10-CM

## 2016-11-12 DIAGNOSIS — E7800 Pure hypercholesterolemia, unspecified: Secondary | ICD-10-CM

## 2016-11-12 DIAGNOSIS — E118 Type 2 diabetes mellitus with unspecified complications: Secondary | ICD-10-CM | POA: Diagnosis not present

## 2016-11-12 DIAGNOSIS — E559 Vitamin D deficiency, unspecified: Secondary | ICD-10-CM | POA: Diagnosis not present

## 2016-11-12 LAB — BAYER DCA HB A1C WAIVED: HB A1C: 5.8 % (ref <7.0)

## 2016-11-12 NOTE — Progress Notes (Signed)
Subjective:    Patient ID: Theresa Watson, female    DOB: 1933-04-29, 83 y.o.   MRN: [id number]  HPI Pt here for follow up and management of chronic medical problems which diabetes, hypertension and hyperlipidemia. She is taking medication regularly.The patient brings in blood sugars for review today and they will be scanned into the record. The majority of these are 130 or less and this is throughout the day. The records go back to October. The patient does complain of some problems with her stomach and she stopped the metformin and this seems to gotten better. Her blood sugars have been stable since that time. She also complains of increased anxiety and a red scaly rash on both arms. She is due to get an FOBT and will be given one today to return. She is also due to get her lab work. Her granddaughter comes with her to the visit today. She says the anxiety issues are better and were associated with the loose bowel movements. The patient denies any chest pain or shortness of breath. She denies any trouble with her stomach including nausea vomiting diarrhea or blood in the stool. She is passing her water without problems. The granddaughter will schedule her for a visit for good dermatology screen on over in the spring time. She does not need any anti-anxiety medications at the present time and we'll continue to have when necessary use of what she has at home.   Patient Active Problem List   Diagnosis Date Noted  . Diabetes mellitus due to underlying condition with diabetic autonomic neuropathy, without long-term current use of insulin ([place]) 09/18/2015  . Annual physical exam 02/26/2015  . Type 2 diabetes mellitus with neurological manifestations, controlled ([place]) 08/29/2013  . Hypertension 11/13/2012  . Cataracts, bilateral 11/13/2012  . Symptomatic menopausal or female climacteric states   . Hyperlipidemia associated with type 2 diabetes mellitus ([place])   . Esophagitis   . Prolapse of vaginal  walls without mention of uterine prolapse   . Obesity, mild   . IBS (irritable bowel syndrome)   . Kidney stone   . Lumbosacral spondylosis without myelopathy   . Pain in joint, shoulder region   . Colon polyp   . Diverticulosis    Outpatient Encounter Prescriptions as of 11/12/2016  Medication Sig  . ACCU-CHEK AVIVA PLUS test strip TEST TWO TIMES DAILY AS  DIRECTED  . aspirin 81 MG EC tablet Take 81 mg by mouth daily.    . Cholecalciferol (VITAMIN D3) 2000 UNITS TABS Take 1 tablet by mouth daily.    . ferrous sulfate 325 (65 FE) MG EC tablet Take 325 mg by mouth daily with breakfast.    . gabapentin (NEURONTIN) 100 MG capsule Take 3 capsules (300 mg total) by mouth at bedtime.  . Garlic 038 MG TABS Take 1 tablet by mouth 2 (two) times daily.   Marland Kitchen geriatric multivitamins-minerals (ELDERTONIC/GEVRABON) ELIX Take 15 mLs by mouth daily.    . Omega-3 Fatty Acids (FISH OIL) 1000 MG CAPS Take 2 capsules by mouth daily.   Marland Kitchen omeprazole (PRILOSEC) 20 MG capsule Take 20 mg by mouth daily.    . potassium chloride SA (K-DUR,KLOR-CON) 20 MEQ tablet TAKE 1 TABLET BY MOUTH  DAILY  . pravastatin (PRAVACHOL) 80 MG tablet TAKE 1 TABLET BY MOUTH  DAILY  . triamterene-hydrochlorothiazide (MAXZIDE-25) 37.5-25 MG tablet TAKE 1 TABLET BY MOUTH  DAILY   No facility-administered encounter medications on file as of 11/12/2016.  Review of Systems  Constitutional: Negative.   HENT: Negative.   Eyes: Negative.   Respiratory: Negative.   Cardiovascular: Negative.   Gastrointestinal: Negative.        Recent upset stomach  - stopped metformin  Endocrine: Negative.   Genitourinary: Negative.   Musculoskeletal: Negative.   Skin: Negative.   Allergic/Immunologic: Negative.   Neurological: Negative.   Hematological: Negative.   Psychiatric/Behavioral: The patient is nervous/anxious.        Objective:   Physical Exam  Constitutional: She is oriented to person, place, and time. She appears well-developed  and well-nourished. No distress.  HENT:  Head: Normocephalic and atraumatic.  Right Ear: External ear normal.  Mouth/Throat: Oropharynx is clear and moist. No oropharyngeal exudate.  Slight earwax left ear canal Dryness of nasal passages  Eyes: Conjunctivae and EOM are normal. Pupils are equal, round, and reactive to light. Right eye exhibits no discharge. Left eye exhibits no discharge. No scleral icterus.  The patient just saw the ophthalmologist last week.  Neck: Normal range of motion. Neck supple. No thyromegaly present.  Cardiovascular: Normal rate, regular rhythm and intact distal pulses.   No murmur heard. Heart is regular today at 72/m  Pulmonary/Chest: Effort normal and breath sounds normal. No respiratory distress. She has no wheezes. She has no rales.  Clear anteriorly and posteriorly  Abdominal: Soft. Bowel sounds are normal. She exhibits no mass. There is no tenderness. There is no rebound and no guarding.  No abdominal tenderness masses or bruits  Musculoskeletal: Normal range of motion. She exhibits no edema.  Lymphadenopathy:    She has no cervical adenopathy.  Neurological: She is alert and oriented to person, place, and time. She has normal reflexes. No cranial nerve deficit.  Skin: Skin is warm and dry.  Generally dry skin.  Psychiatric: She has a normal mood and affect. Her behavior is normal. Judgment and thought content normal.  Nursing note and vitals reviewed.  BP 125/71 (BP Location: Left Arm)   Pulse 69   Temp (!) 96.7 F (35.9 C) (Oral)   Ht 5' (1.524 m)   Wt 143 lb (64.9 kg)   BMI 27.93 kg/m         Assessment & Plan:  1. Type 2 diabetes mellitus with neurological manifestations, controlled ([place]) -For now, continue without the metformin. Continue to monitor blood sugars periodically. If there is any question about blood sugars going higher the patient may need to go back on metformin 500 mg once daily instead of twice daily. Continue with  aggressive therapeutic lifestyle changes. - CBC with Differential/Platelet - Bayer DCA Hb A1c Waived  2. Essential hypertension -The blood pressure is good today and she will continue with current treatment - BMP8+EGFR - CBC with Differential/Platelet - Hepatic function panel  3. Pure hypercholesterolemia -Continue with current treatment and as aggressive therapeutic lifestyle changes as possible pending results of lab work - CBC with Differential/Platelet - Lipid panel  4. Vitamin D deficiency -Continue with current treatment pending results of lab work - CBC with Differential/Platelet - VITAMIN D 25 Hydroxy (Vit-D Deficiency, Fractures)  5. Type 2 diabetes mellitus with complication, without long-term current use of insulin ([facility]) -Monitor blood sugars closely and keep feet checked regularly.  6. Dry skin -Avoid soaps fabric softeners and detergents that are scented  Patient Instructions                       Medicare Annual Wellness Visit  [facility]  and the medical providers at [facility] strive to bring you the best medical care.  In doing so we not only want to address your current medical conditions and concerns but also to detect new conditions early and prevent illness, disease and health-related problems.    Medicare offers a yearly Wellness Visit which allows our clinical staff to assess your need for preventative services including immunizations, lifestyle education, counseling to decrease risk of preventable diseases and screening for fall risk and other medical concerns.    This visit is provided free of charge (no copay) for all Medicare recipients. The clinical pharmacists at [facility] have begun to conduct these Wellness Visits which will also include a thorough review of all your medications.    As you primary medical provider recommend that you make an appointment for your Annual Wellness Visit if you have not done  so already this year.  You may set up this appointment before you leave today or you may call back ([phone]) and schedule an appointment.  Please make sure when you call that you mention that you are scheduling your Annual Wellness Visit with the clinical pharmacist so that the appointment may be made for the proper length of time.     Continue current medications. Continue good therapeutic lifestyle changes which include good diet and exercise. Fall precautions discussed with patient. If an FOBT was given today- please return it to our front desk. If you are over 35 years old - you may need Prevnar 20 or the adult Pneumonia vaccine.  **Flu shots are available--- please call and schedule a FLU-CLINIC appointment**  After your visit with [place] today you will receive a survey in the mail or online from [employer] regarding your care with [place]. Please take a moment to fill this out. Your feedback is very important to [place] as you can help [place] better understand your patient needs as well as improve your experience and satisfaction. WE CARE ABOUT YOU!!!   The rest of the winter, continue to practice good pulmonary and hand hygiene Drink plenty of fluids and stay well hydrated Use nasal saline Have your granddaughter call us if there are any issues that come up    Arrie Senate MD

## 2016-11-12 NOTE — Patient Instructions (Addendum)
Medicare Annual Wellness Visit  [facility] and the medical providers at [facility] strive to bring you the best medical care.  In doing so we not only want to address your current medical conditions and concerns but also to detect new conditions early and prevent illness, disease and health-related problems.    Medicare offers a yearly Wellness Visit which allows our clinical staff to assess your need for preventative services including immunizations, lifestyle education, counseling to decrease risk of preventable diseases and screening for fall risk and other medical concerns.    This visit is provided free of charge (no copay) for all Medicare recipients. The clinical pharmacists at [facility] have begun to conduct these Wellness Visits which will also include a thorough review of all your medications.    As you primary medical provider recommend that you make an appointment for your Annual Wellness Visit if you have not done so already this year.  You may set up this appointment before you leave today or you may call back [phone] and schedule an appointment.  Please make sure when you call that you mention that you are scheduling your Annual Wellness Visit with the clinical pharmacist so that the appointment may be made for the proper length of time.     Continue current medications. Continue good therapeutic lifestyle changes which include good diet and exercise. Fall precautions discussed with patient. If an FOBT was given today- please return it to our front desk. If you are over 2 years old - you may need Prevnar 63 or the adult Pneumonia vaccine.  **Flu shots are available--- please call and schedule a FLU-CLINIC appointment**  After your visit with [place] today you will receive a survey in the mail or online from [employer] regarding your care with [place]. Please take a moment to fill this out. Your feedback is very  important to [place] as you can help [place] better understand your patient needs as well as improve your experience and satisfaction. WE CARE ABOUT YOU!!!   The rest of the winter, continue to practice good pulmonary and hand hygiene Drink plenty of fluids and stay well hydrated Use nasal saline Have your granddaughter call us if there are any issues that come up

## 2016-11-13 LAB — HEPATIC FUNCTION PANEL
ALBUMIN: 4.5 g/dL (ref 3.5–4.7)
ALK PHOS: 70 IU/L (ref 39–117)
ALT: 20 IU/L (ref 0–32)
AST: 21 IU/L (ref 0–40)
Bilirubin Total: 0.6 mg/dL (ref 0.0–1.2)
Bilirubin, Direct: 0.18 mg/dL (ref 0.00–0.40)
TOTAL PROTEIN: 6.8 g/dL (ref 6.0–8.5)

## 2016-11-13 LAB — LIPID PANEL
CHOLESTEROL TOTAL: 162 mg/dL (ref 100–199)
Chol/HDL Ratio: 3.4 ratio units (ref 0.0–4.4)
HDL: 47 mg/dL (ref >39)
LDL Calculated: 85 mg/dL (ref 0–99)
TRIGLYCERIDES: 152 mg/dL — AB (ref 0–149)
VLDL CHOLESTEROL CAL: 30 mg/dL (ref 5–40)

## 2016-11-13 LAB — BMP8+EGFR
BUN / CREAT RATIO: 12 (ref 12–28)
BUN: 10 mg/dL (ref 8–27)
CHLORIDE: 97 mmol/L (ref 96–106)
CO2: 25 mmol/L (ref 18–29)
Calcium: 10.4 mg/dL — ABNORMAL HIGH (ref 8.7–10.3)
Creatinine, Ser: 0.81 mg/dL (ref 0.57–1.00)
GFR calc non Af Amer: 68 mL/min/{1.73_m2} (ref >59)
GFR, EST AFRICAN AMERICAN: 79 mL/min/{1.73_m2} (ref >59)
GLUCOSE: 109 mg/dL — AB (ref 65–99)
Potassium: 3.9 mmol/L (ref 3.5–5.2)
Sodium: 141 mmol/L (ref 134–144)

## 2016-11-13 LAB — CBC WITH DIFFERENTIAL/PLATELET
BASOS ABS: 0 10*3/uL (ref 0.0–0.2)
Basos: 0 %
EOS (ABSOLUTE): 0.1 10*3/uL (ref 0.0–0.4)
Eos: 2 %
Hematocrit: 34.7 % (ref 34.0–46.6)
Hemoglobin: 11.8 g/dL (ref 11.1–15.9)
Immature Grans (Abs): 0 10*3/uL (ref 0.0–0.1)
Immature Granulocytes: 0 %
LYMPHS ABS: 2.6 10*3/uL (ref 0.7–3.1)
Lymphs: 40 %
MCH: 31.2 pg (ref 26.6–33.0)
MCHC: 34 g/dL (ref 31.5–35.7)
MCV: 92 fL (ref 79–97)
MONOS ABS: 0.5 10*3/uL (ref 0.1–0.9)
Monocytes: 8 %
NEUTROS ABS: 3.2 10*3/uL (ref 1.4–7.0)
Neutrophils: 50 %
PLATELETS: 350 10*3/uL (ref 150–379)
RBC: 3.78 x10E6/uL (ref 3.77–5.28)
RDW: 12.7 % (ref 12.3–15.4)
WBC: 6.4 10*3/uL (ref 3.4–10.8)

## 2016-11-13 LAB — VITAMIN D 25 HYDROXY (VIT D DEFICIENCY, FRACTURES): VIT D 25 HYDROXY: 47.2 ng/mL (ref 30.0–100.0)

## 2016-12-01 ENCOUNTER — Other Ambulatory Visit: Payer: Self-pay | Admitting: Family Medicine

## 2016-12-02 ENCOUNTER — Other Ambulatory Visit: Payer: Medicare Other

## 2016-12-02 DIAGNOSIS — Z1211 Encounter for screening for malignant neoplasm of colon: Secondary | ICD-10-CM | POA: Diagnosis not present

## 2016-12-04 LAB — FECAL OCCULT BLOOD, IMMUNOCHEMICAL: Fecal Occult Bld: NEGATIVE

## 2016-12-18 DIAGNOSIS — Z1283 Encounter for screening for malignant neoplasm of skin: Secondary | ICD-10-CM | POA: Diagnosis not present

## 2016-12-18 DIAGNOSIS — D225 Melanocytic nevi of trunk: Secondary | ICD-10-CM | POA: Diagnosis not present

## 2016-12-18 DIAGNOSIS — L570 Actinic keratosis: Secondary | ICD-10-CM | POA: Diagnosis not present

## 2016-12-18 DIAGNOSIS — X32XXXD Exposure to sunlight, subsequent encounter: Secondary | ICD-10-CM | POA: Diagnosis not present

## 2017-02-02 ENCOUNTER — Ambulatory Visit (INDEPENDENT_AMBULATORY_CARE_PROVIDER_SITE_OTHER): Payer: Medicare Other | Admitting: *Deleted

## 2017-02-02 VITALS — BP 124/59 | HR 56 | Ht 61.25 in | Wt 141.0 lb

## 2017-02-02 DIAGNOSIS — Z0000 Encounter for general adult medical examination without abnormal findings: Secondary | ICD-10-CM

## 2017-02-02 NOTE — Progress Notes (Signed)
Subjective:   Theresa Watson is a 83 y.o. female who presents for Medicare Annual (Subsequent) preventive examination. She enjoys word finds, gardening and walking. She states she eats 1-2 meals a day with several snacks. 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 She has 5 children that live local and one granddaughter is here with her today. She attends church in Sundays. She does have basement laundry and she is careful not to fall. We also discussed throw rugs being changed out to rubber-back mats. She states that her health is about the same as it was a year ago.        Objective:     Vitals: BP (!) 124/59 (BP Location: Right Arm)   Pulse (!) 56   Ht 5' 1.25" (1.556 m)   Wt 141 lb (64 kg)   BMI 26.42 kg/m   Body mass index is 26.42 kg/m.   Tobacco History  Smoking Status  . Former Smoker  . Packs/day: 0.50  . Types: Cigarettes  . Start date: 10/06/1951  . Quit date: 02/04/2012  Smokeless Tobacco  . Never Used     Counseling given: Not Answered she quit smoking about 5 years ago.   Past Medical History:  Diagnosis Date  . Colon polyp   . Diabetes mellitus without complication ([place])   . Diverticulosis   . Esophagitis   . IBS (irritable bowel syndrome)   . Kidney stone    hx of 1 kidney stone  . Lumbosacral spondylosis without myelopathy   . Obesity, mild   . Other and unspecified hyperlipidemia   . Pain in joint, shoulder region   . 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 Symptomatic menopausal or female climacteric states    Past Surgical History:  Procedure Laterality Date  . CARPAL TUNNEL RELEASE    . CHOLECYSTECTOMY    . EYE SURGERY Bilateral    cataracts  . lumbar back surgery    . VAGINAL HYSTERECTOMY     partial   Family History  Problem Relation Age of Onset  . Cancer Mother     brain  . Brain cancer Mother   . GI Bleed Father   . Arthritis Sister     rheumatoid   . Lymphoma Sister   . Pancreatic cancer Brother   . Diabetes Son   . Arthritis Son     back  . Cancer Son     prostate  . Cancer Maternal Grandfather   . Suicidality Paternal Grandfather   . Arthritis Son     back  . Hyperlipidemia Son   . Colitis Son    History  Sexual Activity  . Sexual activity: Not Currently    Outpatient Encounter Prescriptions as of 02/02/2017  Medication Sig  . ACCU-CHEK AVIVA PLUS test strip TEST TWO TIMES DAILY AS  DIRECTED  . aspirin 81 MG EC tablet Take 81 mg by mouth daily.    . Calcium Carb-Cholecalciferol (CALCIUM 600 + D PO) Take by mouth.  . Cholecalciferol (VITAMIN D3) 2000 UNITS TABS Take 1 tablet by mouth daily.    . ferrous sulfate 325 (65 FE) MG EC tablet Take 325 mg by mouth daily with breakfast.    . gabapentin (NEURONTIN) 100 MG capsule TAKE 3 CAPSULES BY MOUTH AT BEDTIME  . Garlic 063 MG TABS Take 1 tablet by mouth 2 (two) times daily.   Marland Kitchen  geriatric multivitamins-minerals (ELDERTONIC/GEVRABON) ELIX Take 15 mLs by mouth daily.    . Omega-3 Fatty Acids (FISH OIL) 1000 MG CAPS Take 2 capsules by mouth daily.   Marland Kitchen omeprazole (PRILOSEC) 20 MG capsule Take 20 mg by mouth daily.    Marland Kitchen OVER THE COUNTER MEDICATION Vit C, vit E, green tea pill, garlic, vit Z61  . potassium chloride SA (K-DUR,KLOR-CON) 20 MEQ tablet TAKE 1 TABLET BY MOUTH  DAILY  . pravastatin (PRAVACHOL) 80 MG tablet TAKE 1 TABLET BY MOUTH  DAILY  . triamterene-hydrochlorothiazide (MAXZIDE-25) 37.5-25 MG tablet TAKE 1 TABLET BY MOUTH  DAILY   No facility-administered encounter medications on file as of 02/02/2017.     Activities of Daily Living In your present state of health, do you have any difficulty performing the following activities: 02/02/2017  Hearing? N  Vision? Y  Difficulty concentrating or making decisions? N  Walking or climbing stairs? N  Dressing or bathing? N  Doing errands, shopping? N  Some recent data might be hidden   She wears Rx glasses  daily  Patient Care Team: [name], MD as PCP - General (Family Medicine) Clarene Essex, MD as Consulting Physician (Gastroenterology) Allyn Kenner, MD (Dermatology) Adelina Mings Margret Chance, MD as Referring Physician (Optometry)    Assessment:    Exercise Activities and Dietary recommendations    Goals    . Have 3 meals a day          Cut back on the peanut butter.       Fall Risk Fall Risk  02/02/2017 11/12/2016 09/15/2016 09/03/2016 08/27/2016  Falls in the past year? No No No No No  Number falls in past yr: - - - - -  Injury with Fall? - - - - -   Depression Screen PHQ 2/9 Scores 02/02/2017 11/12/2016 09/15/2016 09/03/2016  PHQ - 2 Score 0 0 0 0     Cognitive Function MMSE - Mini Mental State Exam 02/02/2017  Orientation to time 5  Orientation to Place 5  Registration 3  Attention/ Calculation 4  Recall 3  Language- name 2 objects 2  Language- repeat 1  Language- follow 3 step command 3  Language- read & follow direction 1  Write a sentence 1  Copy design 1  Total score 29    score today is 29/30.    Immunization History  Administered Date(s) Administered  . Influenza Split 06/24/2012  . Influenza Whole 07/06/2008  . Influenza, High Dose Seasonal PF 07/02/2016  . Influenza,inj,Quad PF,36+ Mos 07/06/2013, 07/05/2014, 07/05/2015  . Pneumococcal Conjugate-13 08/29/2013  . Pneumococcal Polysaccharide-23 03/06/2004  . Td 03/06/2004  . Tdap 07/06/2009  . Zoster 08/07/2008   Screening Tests Health Maintenance  Topic Date Due  . INFLUENZA VACCINE  05/06/2017  . HEMOGLOBIN A1C  05/12/2017  . URINE MICROALBUMIN  07/02/2017  . OPHTHALMOLOGY EXAM  11/10/2017  . FOOT EXAM  11/12/2017  . TETANUS/TDAP  07/07/2019  . DEXA SCAN  Completed  . PNA vac Low Risk Adult  Completed      Plan:     After a long discussion, she has agreed to see DR Evette Doffing for a pelvic exam. She will follow up with Dr Laurance Flatten and get an EKG in June. She and her granddaughter will review the  advanced directives.  She is up to date on all vaccines  I have personally reviewed and noted the following in the patient's chart:   . Medical and social history . Use of alcohol, tobacco  or illicit drugs  . Current medications and supplements . Functional ability and status . Nutritional status . Physical activity . Advanced directives . List of other physicians . Hospitalizations, surgeries, and ER visits in previous 12 months . Vitals . Screenings to include cognitive, depression, and falls . Referrals and appointments  In addition, I have reviewed and discussed with patient certain preventive protocols, quality metrics, and best practice recommendations. A written personalized care plan for preventive services as well as general preventive health recommendations were provided to patient.     Gianah Batt, Cameron Proud, LPN  1/43/8887  I have reviewed and agree with the above AWV documentation.   Mary-Margaret Hassell Done, FNP

## 2017-02-02 NOTE — Patient Instructions (Signed)
  Theresa Watson , Thank you for taking time to come for your Medicare Wellness Visit. I appreciate your ongoing commitment to your health goals. Please review the following plan we discussed and let me know if I can assist you in the future.   These are the goals we discussed: Goals    . Have 3 meals a day          Cut back on the peanut butter.        This is a list of the screening recommended for you and due dates:  Health Maintenance  Topic Date Due  . Flu Shot  05/06/2017  . Hemoglobin A1C  05/12/2017  . Urine Protein Check  07/02/2017  . Eye exam for diabetics  11/10/2017  . Complete foot exam   11/12/2017  . Tetanus Vaccine  07/07/2019  . DEXA scan (bone density measurement)  Completed  . Pneumonia vaccines  Completed   Keep follow up with Dr Laurance Flatten Come to the appointment for you pelvic exam with Dr Evette Doffing. We will plan to do a EKG at the next visit with Dr Laurance Flatten. Review the advanced directives.

## 2017-02-10 ENCOUNTER — Telehealth: Payer: Self-pay | Admitting: *Deleted

## 2017-02-10 NOTE — Telephone Encounter (Signed)
Left message for patient to call back.  Patient does not need to be seen tomorrow with Dr. Evette Doffing for a pelvic exam.  Patient has had a hysterectomy and had a pap smear less than 2 years ago.  Appt can be canceled per Dr. Evette Doffing unless patient needs to be seen for anything else.

## 2017-02-11 ENCOUNTER — Other Ambulatory Visit: Payer: Medicare Other | Admitting: Pediatrics

## 2017-02-26 ENCOUNTER — Other Ambulatory Visit: Payer: Self-pay | Admitting: Family Medicine

## 2017-04-01 ENCOUNTER — Encounter: Payer: Self-pay | Admitting: Family Medicine

## 2017-04-01 ENCOUNTER — Ambulatory Visit (INDEPENDENT_AMBULATORY_CARE_PROVIDER_SITE_OTHER): Payer: Medicare Other | Admitting: Family Medicine

## 2017-04-01 VITALS — BP 115/68 | HR 53 | Temp 96.6°F | Ht 61.25 in | Wt 143.0 lb

## 2017-04-01 DIAGNOSIS — E785 Hyperlipidemia, unspecified: Secondary | ICD-10-CM

## 2017-04-01 DIAGNOSIS — I10 Essential (primary) hypertension: Secondary | ICD-10-CM

## 2017-04-01 DIAGNOSIS — E1149 Type 2 diabetes mellitus with other diabetic neurological complication: Secondary | ICD-10-CM | POA: Diagnosis not present

## 2017-04-01 DIAGNOSIS — E7800 Pure hypercholesterolemia, unspecified: Secondary | ICD-10-CM | POA: Diagnosis not present

## 2017-04-01 DIAGNOSIS — E1169 Type 2 diabetes mellitus with other specified complication: Secondary | ICD-10-CM | POA: Diagnosis not present

## 2017-04-01 DIAGNOSIS — W57XXXA Bitten or stung by nonvenomous insect and other nonvenomous arthropods, initial encounter: Secondary | ICD-10-CM

## 2017-04-01 DIAGNOSIS — E1143 Type 2 diabetes mellitus with diabetic autonomic (poly)neuropathy: Secondary | ICD-10-CM

## 2017-04-01 DIAGNOSIS — E559 Vitamin D deficiency, unspecified: Secondary | ICD-10-CM | POA: Diagnosis not present

## 2017-04-01 LAB — BAYER DCA HB A1C WAIVED: HB A1C (BAYER DCA - WAIVED): 6.1 % (ref <7.0)

## 2017-04-01 NOTE — Progress Notes (Signed)
Subjective:    Patient ID: Theresa Watson, female    DOB: Oct 08, 1932, 83 y.o.   MRN: [id number]  HPI Pt here for follow up and management of chronic medical problems which includes diabetes, hyperlipidemia and hypertension. He is taking medication regularly.The patient comes to the visit today with her granddaughter. She denies any chest pain or shortness of breath. She does have a lot of belching and gas and has a history of 3 tick bites two which sound like lone star tics. The patient has no symptoms related to this and they occurred maybe 4 weeks ago. She does also describe a lot of belching and gas. She eats cheese daily and has milk daily. She will try to leave these off and see if this makes any difference with the belching and gas. She had an eye exam in February and gets this yearly. She denies any blood in the stool but does have dark stools because of taking iron chronically. She is passing her water without problems. She is due to get her breast exam soon as well as her mammogram. We will do the breast exam today. Blood sugars were brought in for review and all these were good and will be scanned into the record. She has a follow-up appointment with her dermatologist in September.   Patient Active Problem List   Diagnosis Date Noted  . Diabetes mellitus due to underlying condition with diabetic autonomic neuropathy, without long-term current use of insulin ([place]) 09/18/2015  . Annual physical exam 02/26/2015  . Type 2 diabetes mellitus with neurological manifestations, controlled ([place]) 08/29/2013  . Hypertension 11/13/2012  . Cataracts, bilateral 11/13/2012  . Symptomatic menopausal or female climacteric states   . Hyperlipidemia associated with type 2 diabetes mellitus ([place])   . Esophagitis   . 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 Obesity, mild   . IBS (irritable bowel syndrome)   . Kidney stone   . Lumbosacral spondylosis without myelopathy   . Pain  in joint, shoulder region   . Colon polyp   . Diverticulosis    Outpatient Encounter Prescriptions as of 04/01/2017  Medication Sig  . ACCU-CHEK AVIVA PLUS test strip TEST TWO TIMES DAILY AS  DIRECTED  . aspirin 81 MG EC tablet Take 81 mg by mouth daily.    . Calcium Carb-Cholecalciferol (CALCIUM 600 + D PO) Take by mouth.  . Cholecalciferol (VITAMIN D3) 2000 UNITS TABS Take 1 tablet by mouth daily.    . ferrous sulfate 325 (65 FE) MG EC tablet Take 325 mg by mouth daily with breakfast.    . gabapentin (NEURONTIN) 100 MG capsule TAKE 3 CAPSULES BY MOUTH AT BEDTIME  . Garlic 329 MG TABS Take 1 tablet by mouth 2 (two) times daily.   Marland Kitchen geriatric multivitamins-minerals (ELDERTONIC/GEVRABON) ELIX Take 15 mLs by mouth daily.    . Omega-3 Fatty Acids (FISH OIL) 1000 MG CAPS Take 2 capsules by mouth daily.   Marland Kitchen omeprazole (PRILOSEC) 20 MG capsule Take 20 mg by mouth daily.    Marland Kitchen OVER THE COUNTER MEDICATION Vit C, vit E, green tea pill, garlic, vit J18  . potassium chloride SA (K-DUR,KLOR-CON) 20 MEQ tablet TAKE 1 TABLET BY MOUTH  DAILY  . pravastatin (PRAVACHOL) 80 MG tablet TAKE 1 TABLET BY MOUTH  DAILY  . triamterene-hydrochlorothiazide (MAXZIDE-25) 37.5-25 MG tablet TAKE 1 TABLET BY MOUTH  DAILY   No facility-administered encounter medications on file as of 04/01/2017.  Review of Systems  Constitutional: Negative.   HENT: Negative.   Eyes: Negative.   Respiratory: Negative.   Cardiovascular: Negative.   Gastrointestinal: Negative.        Belching AND flatulence  Endocrine: Negative.   Genitourinary: Negative.   Musculoskeletal: Negative.   Skin: Negative.   Allergic/Immunologic: Negative.   Neurological: Negative.   Hematological: Negative.   Psychiatric/Behavioral: Negative.        Objective:   Physical Exam  Constitutional: She is oriented to person, place, and time. She appears well-developed and well-nourished.  The patient is pleasant and alert and responded  appropriately to questions asked of her  HENT:  Head: Normocephalic and atraumatic.  Right Ear: External ear normal.  Left Ear: External ear normal.  Mouth/Throat: Oropharynx is clear and moist. No oropharyngeal exudate.  Slight nasal congestion left greater than right  Eyes: Conjunctivae and EOM are normal. Pupils are equal, round, and reactive to light. Right eye exhibits no discharge. Left eye exhibits no discharge. No scleral icterus.  Eye exams are regularly done in February on a yearly basis  Neck: Normal range of motion. Neck supple. No JVD present. No thyromegaly present.  No bruits thyromegaly or anterior cervical adenopathy  Cardiovascular: Normal rate, regular rhythm, normal heart sounds and intact distal pulses.   No murmur heard. Heart has a regular rate and rhythm at 72/m  Pulmonary/Chest: Effort normal and breath sounds normal. No respiratory distress. She has no wheezes. She has no rales. She exhibits no tenderness.  Clear anteriorly and posteriorly  Abdominal: Soft. Bowel sounds are normal. She exhibits no mass. There is no tenderness. There is no rebound and no guarding.  The abdomen is soft without bruits masses or organ enlargement  Genitourinary:  Genitourinary Comments: Both breasts were checked bilaterally and axillary regions were checked and no adenopathy or lumps were palpated.  Musculoskeletal: Normal range of motion. She exhibits no edema.  Lymphadenopathy:    She has no cervical adenopathy.  Neurological: She is alert and oriented to person, place, and time. She has normal reflexes. No cranial nerve deficit.  Skin: Skin is warm and dry. No rash noted.  Tick bite sites appear to be healing well.  Psychiatric: She has a normal mood and affect. Her behavior is normal. Judgment and thought content normal.  Nursing note and vitals reviewed.  BP 115/68 (BP Location: Right Arm)   Pulse (!) 53   Temp (!) 96.6 F (35.9 C) (Oral)   Ht 5' 1.25" (1.556 m)   Wt 143  lb (64.9 kg)   BMI 26.80 kg/m    EKG today with results pending====     Assessment & Plan:  1. Type 2 diabetes mellitus with neurological manifestations, controlled ([place]) -Patient blood sugars were brought in for review and I will be scanned into the record and all of these look good. She is currently not taking any medication for this and only controls her blood sugar with diet. - CBC with Differential/Platelet - Bayer DCA Hb A1c Waived  2. Essential hypertension -The blood pressure is good today and she will continue with current treatment - CBC with Differential/Platelet - BMP8+EGFR - Hepatic function panel - EKG 12-Lead  3. Pure hypercholesterolemia -Continue with current treatment and aggressive therapeutic lifestyle changes pending results of lab work - CBC with Differential/Platelet - Lipid panel - EKG 12-Lead  4. Vitamin D deficiency -Continue with current treatment pending results of lab work - CBC with Differential/Platelet - VITAMIN D 25 Hydroxy (Vit-D  Deficiency, Fractures)  5. Hyperlipidemia associated with type 2 diabetes mellitus ([place]) -Continue with aggressive therapeutic lifestyle changes and current treatment for cholesterol  6. Diabetic autonomic neuropathy associated with type 2 diabetes mellitus ([place]) -There appears to be no worsening of this problem.  7. Tick bite, initial encounter -This occurred about 4 weeks ago and the patient has had no symptoms since that time. All the areas appeared to be healing. We will check tests for Lyme disease and [facility] spotted fever with the blood work today. - Lyme Ab/Western Blot Reflex - [place] mtn spotted fvr abs pnl(IgG+IgM)  Patient Instructions                       Medicare Annual Wellness Visit  [facility] and the medical providers at [facility] strive to bring you the best medical care.  In doing so we not only want to address your current medical conditions and concerns but  also to detect new conditions early and prevent illness, disease and health-related problems.    Medicare offers a yearly Wellness Visit which allows our clinical staff to assess your need for preventative services including immunizations, lifestyle education, counseling to decrease risk of preventable diseases and screening for fall risk and other medical concerns.    This visit is provided free of charge (no copay) for all Medicare recipients. The clinical pharmacists at [facility] have begun to conduct these Wellness Visits which will also include a thorough review of all your medications.    As you primary medical provider recommend that you make an appointment for your Annual Wellness Visit if you have not done so already this year.  You may set up this appointment before you leave today or you may call back ([phone]) and schedule an appointment.  Please make sure when you call that you mention that you are scheduling your Annual Wellness Visit with the clinical pharmacist so that the appointment may be made for the proper length of time.     Continue current medications. Continue good therapeutic lifestyle changes which include good diet and exercise. Fall precautions discussed with patient. If an FOBT was given today- please return it to our front desk. If you are over 27 years old - you may need Prevnar 52 or the adult Pneumonia vaccine.  **Flu shots are available--- please call and schedule a FLU-CLINIC appointment**  After your visit with [place] today you will receive a survey in the mail or online from [employer] regarding your care with [place]. Please take a moment to fill this out. Your feedback is very important to [place] as you can help [place] better understand your patient needs as well as improve your experience and satisfaction. WE CARE ABOUT YOU!!!   Stay is active as possible and stay well hydrated especially this summer Check body regularly for ticks and tick bites  and let us know if there are any future bites Get mammogram as planned Check breasts regularly Wear wider toed shoes Check feet regularly and continue to monitor blood sugars regularly    Arrie Senate MD

## 2017-04-01 NOTE — Patient Instructions (Addendum)
Medicare Annual Wellness Visit  [facility] and the medical providers at [facility] strive to bring you the best medical care.  In doing so we not only want to address your current medical conditions and concerns but also to detect new conditions early and prevent illness, disease and health-related problems.    Medicare offers a yearly Wellness Visit which allows our clinical staff to assess your need for preventative services including immunizations, lifestyle education, counseling to decrease risk of preventable diseases and screening for fall risk and other medical concerns.    This visit is provided free of charge (no copay) for all Medicare recipients. The clinical pharmacists at [facility] have begun to conduct these Wellness Visits which will also include a thorough review of all your medications.    As you primary medical provider recommend that you make an appointment for your Annual Wellness Visit if you have not done so already this year.  You may set up this appointment before you leave today or you may call back [phone]) and schedule an appointment.  Please make sure when you call that you mention that you are scheduling your Annual Wellness Visit with the clinical pharmacist so that the appointment may be made for the proper length of time.     Continue current medications. Continue good therapeutic lifestyle changes which include good diet and exercise. Fall precautions discussed with patient. If an FOBT was given today- please return it to our front desk. If you are over 81 years old - you may need Prevnar 74 or the adult Pneumonia vaccine.  **Flu shots are available--- please call and schedule a FLU-CLINIC appointment**  After your visit with [place] today you will receive a survey in the mail or online from [employer] regarding your care with [place]. Please take a moment to fill this out. Your feedback is very  important to [place] as you can help [place] better understand your patient needs as well as improve your experience and satisfaction. WE CARE ABOUT YOU!!!   Stay is active as possible and stay well hydrated especially this summer Check body regularly for ticks and tick bites and let us know if there are any future bites Get mammogram as planned Check breasts regularly Wear wider toed shoes Check feet regularly and continue to monitor blood sugars regularly

## 2017-04-03 LAB — HEPATIC FUNCTION PANEL
ALT: 19 IU/L (ref 0–32)
AST: 19 IU/L (ref 0–40)
Albumin: 4.4 g/dL (ref 3.5–4.7)
Alkaline Phosphatase: 66 IU/L (ref 39–117)
BILIRUBIN, DIRECT: 0.17 mg/dL (ref 0.00–0.40)
Bilirubin Total: 0.7 mg/dL (ref 0.0–1.2)
TOTAL PROTEIN: 6.8 g/dL (ref 6.0–8.5)

## 2017-04-03 LAB — BMP8+EGFR
BUN / CREAT RATIO: 18 (ref 12–28)
BUN: 14 mg/dL (ref 8–27)
CHLORIDE: 97 mmol/L (ref 96–106)
CO2: 27 mmol/L (ref 20–29)
Calcium: 10.2 mg/dL (ref 8.7–10.3)
Creatinine, Ser: 0.79 mg/dL (ref 0.57–1.00)
GFR calc Af Amer: 81 mL/min/{1.73_m2} (ref >59)
GFR calc non Af Amer: 70 mL/min/{1.73_m2} (ref >59)
GLUCOSE: 114 mg/dL — AB (ref 65–99)
POTASSIUM: 4.1 mmol/L (ref 3.5–5.2)
SODIUM: 138 mmol/L (ref 134–144)

## 2017-04-03 LAB — CBC WITH DIFFERENTIAL/PLATELET
BASOS ABS: 0 10*3/uL (ref 0.0–0.2)
Basos: 1 %
EOS (ABSOLUTE): 0.2 10*3/uL (ref 0.0–0.4)
Eos: 4 %
Hematocrit: 38.8 % (ref 34.0–46.6)
Hemoglobin: 12.4 g/dL (ref 11.1–15.9)
IMMATURE GRANS (ABS): 0 10*3/uL (ref 0.0–0.1)
Immature Granulocytes: 0 %
LYMPHS: 43 %
Lymphocytes Absolute: 2.4 10*3/uL (ref 0.7–3.1)
MCH: 30.8 pg (ref 26.6–33.0)
MCHC: 32 g/dL (ref 31.5–35.7)
MCV: 97 fL (ref 79–97)
MONOS ABS: 0.5 10*3/uL (ref 0.1–0.9)
Monocytes: 9 %
NEUTROS PCT: 43 %
Neutrophils Absolute: 2.5 10*3/uL (ref 1.4–7.0)
PLATELETS: 313 10*3/uL (ref 150–379)
RBC: 4.02 x10E6/uL (ref 3.77–5.28)
RDW: 13.3 % (ref 12.3–15.4)
WBC: 5.7 10*3/uL (ref 3.4–10.8)

## 2017-04-03 LAB — LYME AB/WESTERN BLOT REFLEX

## 2017-04-03 LAB — RMSF, IGG, IFA: RMSF, IGG, IFA: 1:256 {titer} — ABNORMAL HIGH

## 2017-04-03 LAB — LIPID PANEL
CHOLESTEROL TOTAL: 162 mg/dL (ref 100–199)
Chol/HDL Ratio: 3.8 ratio (ref 0.0–4.4)
HDL: 43 mg/dL (ref >39)
LDL Calculated: 75 mg/dL (ref 0–99)
TRIGLYCERIDES: 220 mg/dL — AB (ref 0–149)
VLDL Cholesterol Cal: 44 mg/dL — ABNORMAL HIGH (ref 5–40)

## 2017-04-03 LAB — ROCKY MTN SPOTTED FVR ABS PNL(IGG+IGM)
RMSF IgG: POSITIVE — AB
RMSF IgM: 0.47 index (ref 0.00–0.89)

## 2017-04-03 LAB — VITAMIN D 25 HYDROXY (VIT D DEFICIENCY, FRACTURES): VIT D 25 HYDROXY: 40.3 ng/mL (ref 30.0–100.0)

## 2017-04-06 ENCOUNTER — Other Ambulatory Visit: Payer: Self-pay | Admitting: *Deleted

## 2017-04-06 MED ORDER — DOXYCYCLINE HYCLATE 100 MG PO TABS: 100.0000 mg | ORAL_TABLET | Freq: Two times a day (BID) | ORAL | 0 refills | Status: DC

## 2017-04-29 ENCOUNTER — Telehealth: Payer: Self-pay | Admitting: Family Medicine

## 2017-04-29 NOTE — Telephone Encounter (Signed)
Granddaughter aware of labs

## 2017-05-04 ENCOUNTER — Encounter: Payer: Medicare Other | Admitting: *Deleted

## 2017-05-04 DIAGNOSIS — Z1231 Encounter for screening mammogram for malignant neoplasm of breast: Secondary | ICD-10-CM | POA: Diagnosis not present

## 2017-05-28 ENCOUNTER — Other Ambulatory Visit: Payer: Self-pay | Admitting: Family Medicine

## 2017-06-25 DIAGNOSIS — L820 Inflamed seborrheic keratosis: Secondary | ICD-10-CM | POA: Diagnosis not present

## 2017-06-25 DIAGNOSIS — X32XXXD Exposure to sunlight, subsequent encounter: Secondary | ICD-10-CM | POA: Diagnosis not present

## 2017-06-25 DIAGNOSIS — L570 Actinic keratosis: Secondary | ICD-10-CM | POA: Diagnosis not present

## 2017-07-01 ENCOUNTER — Other Ambulatory Visit: Payer: Self-pay | Admitting: Family Medicine

## 2017-07-07 ENCOUNTER — Ambulatory Visit (INDEPENDENT_AMBULATORY_CARE_PROVIDER_SITE_OTHER): Payer: Medicare Other

## 2017-07-07 DIAGNOSIS — Z23 Encounter for immunization: Secondary | ICD-10-CM | POA: Diagnosis not present

## 2017-08-10 ENCOUNTER — Encounter: Payer: Self-pay | Admitting: Family Medicine

## 2017-08-10 ENCOUNTER — Ambulatory Visit (INDEPENDENT_AMBULATORY_CARE_PROVIDER_SITE_OTHER): Payer: Medicare Other | Admitting: Family Medicine

## 2017-08-10 ENCOUNTER — Ambulatory Visit (INDEPENDENT_AMBULATORY_CARE_PROVIDER_SITE_OTHER): Payer: Medicare Other

## 2017-08-10 VITALS — BP 132/71 | HR 53 | Temp 97.0°F | Ht 61.25 in | Wt 145.0 lb

## 2017-08-10 DIAGNOSIS — E559 Vitamin D deficiency, unspecified: Secondary | ICD-10-CM

## 2017-08-10 DIAGNOSIS — E7800 Pure hypercholesterolemia, unspecified: Secondary | ICD-10-CM

## 2017-08-10 DIAGNOSIS — E1149 Type 2 diabetes mellitus with other diabetic neurological complication: Secondary | ICD-10-CM

## 2017-08-10 DIAGNOSIS — Z13820 Encounter for screening for osteoporosis: Secondary | ICD-10-CM | POA: Diagnosis not present

## 2017-08-10 DIAGNOSIS — I10 Essential (primary) hypertension: Secondary | ICD-10-CM

## 2017-08-10 DIAGNOSIS — M8589 Other specified disorders of bone density and structure, multiple sites: Secondary | ICD-10-CM | POA: Diagnosis not present

## 2017-08-10 DIAGNOSIS — Z780 Asymptomatic menopausal state: Secondary | ICD-10-CM

## 2017-08-10 LAB — BAYER DCA HB A1C WAIVED: HB A1C: 6.6 % (ref <7.0)

## 2017-08-10 NOTE — Progress Notes (Signed)
Subjective:    Patient ID: Theresa Watson, female    DOB: 25-Jan-1933, 83 y.o.   MRN: [id number]  HPI Pt here for follow up and management of chronic medical problems which includes hypertension, hyperlipidemia and diabetes. She is taking medication regularly.  The patient has no specific complaints today and is not requesting any refills on any of her medicines.  Her vital signs are stable.  Work today and will get a DEXA scan today.  She brings in blood sugars for review and fasting blood sugars are typically in the 114 level and during the day they may run as high as 147 but most are in the 120 level.  These will be scanned into the record.  Patient denies any chest pain or shortness of breath.  Her daughter comes with her to the visit today and she substantiated all the responses to the questions that I ask her.  She looks good is smiling and alert.  She pretty much lives by herself and does go up and down steps which have hand rails for washing and drying and the family is well aware of the step situation and feel comfortable with it at the present time.  The patient denies any trouble with nausea vomiting diarrhea or blood in the stool.  Her stools are dark because she is taking iron.  She is passing her water without problems.  As already mentioned her blood sugars are running good at home.     Patient Active Problem List   Diagnosis Date Noted  . Diabetes mellitus due to underlying condition with diabetic autonomic neuropathy, without long-term current use of insulin ([place]) 09/18/2015  . Annual physical exam 02/26/2015  . Type 2 diabetes mellitus with neurological manifestations, controlled ([place]) 08/29/2013  . Hypertension 11/13/2012  . Cataracts, bilateral 11/13/2012  . Symptomatic menopausal or female climacteric states   . Hyperlipidemia associated with type 2 diabetes mellitus ([place])   . Esophagitis   . 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 Obesity, mild     . IBS (irritable bowel syndrome)   . Kidney stone   . Lumbosacral spondylosis without myelopathy   . Pain in joint, shoulder region   . Colon polyp   . Diverticulosis    Outpatient Encounter Medications as of 08/10/2017  Medication Sig  . ACCU-CHEK AVIVA PLUS test strip TEST TWO TIMES DAILY AS  DIRECTED  . aspirin 81 MG EC tablet Take 81 mg by mouth daily.    . Calcium Carb-Cholecalciferol (CALCIUM 600 + D PO) Take by mouth.  . Cholecalciferol (VITAMIN D3) 2000 UNITS TABS Take 1 tablet by mouth daily.    . ferrous sulfate 325 (65 FE) MG EC tablet Take 325 mg by mouth daily with breakfast.    . gabapentin (NEURONTIN) 100 MG capsule TAKE 3 CAPSULES BY MOUTH AT BEDTIME  . Garlic 650 MG TABS Take 1 tablet by mouth 2 (two) times daily.   Marland Kitchen geriatric multivitamins-minerals (ELDERTONIC/GEVRABON) ELIX Take 15 mLs by mouth daily.    . Omega-3 Fatty Acids (FISH OIL) 1000 MG CAPS Take 2 capsules by mouth daily.   Marland Kitchen omeprazole (PRILOSEC) 20 MG capsule Take 20 mg by mouth daily.    Marland Kitchen OVER THE COUNTER MEDICATION Vit C, vit E, green tea pill, garlic, vit P54  . potassium chloride SA (K-DUR,KLOR-CON) 20 MEQ tablet TAKE 1 TABLET BY MOUTH  DAILY  . pravastatin (PRAVACHOL) 80 MG tablet TAKE 1 TABLET BY MOUTH  DAILY  . triamterene-hydrochlorothiazide (MAXZIDE-25) 37.5-25 MG tablet TAKE 1 TABLET BY MOUTH  DAILY (Patient not taking: Reported on 08/10/2017)  . [DISCONTINUED] doxycycline (VIBRA-TABS) 100 MG tablet Take 1 tablet (100 mg total) by mouth 2 (two) times daily. 1 po bid   No facility-administered encounter medications on file as of 08/10/2017.      Review of Systems  Constitutional: Negative.   HENT: Negative.   Eyes: Negative.   Respiratory: Negative.   Cardiovascular: Negative.   Gastrointestinal: Negative.   Endocrine: Negative.   Genitourinary: Negative.   Musculoskeletal: Negative.   Skin: Negative.   Allergic/Immunologic: Negative.   Neurological: Negative.   Hematological: Negative.    Psychiatric/Behavioral: Negative.        Objective:   Physical Exam  Constitutional: She is oriented to person, place, and time. She appears well-developed and well-nourished. No distress.  The patient is pleasant and alert and looks great for her age.  HENT:  Head: Normocephalic and atraumatic.  Right Ear: External ear normal.  Left Ear: External ear normal.  Nose: Nose normal.  Mouth/Throat: Oropharynx is clear and moist. No oropharyngeal exudate.  Eyes: Conjunctivae and EOM are normal. Pupils are equal, round, and reactive to light. Right eye exhibits no discharge. Left eye exhibits no discharge. No scleral icterus.  In February.  She will get her next eye exam  Neck: Normal range of motion. Neck supple. No thyromegaly present.  No bruits thyromegaly or anterior cervical adenopathy  Cardiovascular: Normal rate, regular rhythm, normal heart sounds and intact distal pulses.  No murmur heard. Heart is regular at 60/min  Pulmonary/Chest: Effort normal and breath sounds normal. No respiratory distress. She has no wheezes. She has no rales.  Clear anteriorly and posteriorly  Abdominal: Soft. Bowel sounds are normal. She exhibits no mass. There is no tenderness. There is no rebound and no guarding.  No abdominal tenderness masses or organ enlargement or bruits  Musculoskeletal: Normal range of motion. She exhibits no edema.  Lymphadenopathy:    She has no cervical adenopathy.  Neurological: She is alert and oriented to person, place, and time. She has normal reflexes. No cranial nerve deficit.  Skin: Skin is warm and dry. No rash noted.  Psychiatric: She has a normal mood and affect. Her behavior is normal. Judgment and thought content normal.  Nursing note and vitals reviewed.  BP 132/71 (BP Location: Right Arm)   Pulse (!) 53   Temp (!) 97 F (36.1 C) (Oral)   Ht 5' 1.25" (1.556 m)   Wt 145 lb (65.8 kg)   BMI 27.17 kg/m         Assessment & Plan:  1. Essential  hypertension -The blood pressure is good today she will continue with current treatment - BMP8+EGFR - CBC with Differential/Platelet - Hepatic function panel  2. Type 2 diabetes mellitus with neurological manifestations, controlled ([place]) -The blood sugar is brought in from the outside look good and I anticipate that her A1c will also be stable and good. - CBC with Differential/Platelet - Bayer DCA Hb A1c Waived - Microalbumin / creatinine urine ratio  3. Pure hypercholesterolemia -Continue with current treatment and with as aggressive therapeutic lifestyle changes as possible - CBC with Differential/Platelet - Lipid panel  4. Vitamin D deficiency -Continue with vitamin D replacement pending results of lab work - CBC with Differential/Platelet - VITAMIN D 25 Hydroxy (Vit-D Deficiency, Fractures) - DG [facility] DEXA; Future  5. Screening for osteoporosis - DG [facility] DEXA; Future  6.  Postmenopausal - DG [facility] DEXA; Future   Patient Instructions                       Medicare Annual Wellness Visit  [facility] and the medical providers at [facility] strive to bring you the best medical care.  In doing so we not only want to address your current medical conditions and concerns but also to detect new conditions early and prevent illness, disease and health-related problems.    Medicare offers a yearly Wellness Visit which allows our clinical staff to assess your need for preventative services including immunizations, lifestyle education, counseling to decrease risk of preventable diseases and screening for fall risk and other medical concerns.    This visit is provided free of charge (no copay) for all Medicare recipients. The clinical pharmacists at [facility] have begun to conduct these Wellness Visits which will also include a thorough review of all your medications.    As you primary medical provider recommend that you make an appointment  for your Annual Wellness Visit if you have not done so already this year.  You may set up this appointment before you leave today or you may call back ([phone]) and schedule an appointment.  Please make sure when you call that you mention that you are scheduling your Annual Wellness Visit with the clinical pharmacist so that the appointment may be made for the proper length of time.     Continue current medications. Continue good therapeutic lifestyle changes which include good diet and exercise. Fall precautions discussed with patient. If an FOBT was given today- please return it to our front desk. If you are over 72 years old - you may need Prevnar 63 or the adult Pneumonia vaccine.  **Flu shots are available--- please call and schedule a FLU-CLINIC appointment**  After your visit with [place] today you will receive a survey in the mail or online from [employer] regarding your care with [place]. Please take a moment to fill this out. Your feedback is very important to [place] as you can help [place] better understand your patient needs as well as improve your experience and satisfaction. WE CARE ABOUT YOU!!!  Continue to be careful and do not put yourself at risk for falling We will call with your DEXA scan results and lab work results as soon as these results become available   Arrie Senate MD

## 2017-08-10 NOTE — Patient Instructions (Addendum)
Medicare Annual Wellness Visit  [facility] and the medical providers at [facility] strive to bring you the best medical care.  In doing so we not only want to address your current medical conditions and concerns but also to detect new conditions early and prevent illness, disease and health-related problems.    Medicare offers a yearly Wellness Visit which allows our clinical staff to assess your need for preventative services including immunizations, lifestyle education, counseling to decrease risk of preventable diseases and screening for fall risk and other medical concerns.    This visit is provided free of charge (no copay) for all Medicare recipients. The clinical pharmacists at [facility] have begun to conduct these Wellness Visits which will also include a thorough review of all your medications.    As you primary medical provider recommend that you make an appointment for your Annual Wellness Visit if you have not done so already this year.  You may set up this appointment before you leave today or you may call back ([phone]) and schedule an appointment.  Please make sure when you call that you mention that you are scheduling your Annual Wellness Visit with the clinical pharmacist so that the appointment may be made for the proper length of time.     Continue current medications. Continue good therapeutic lifestyle changes which include good diet and exercise. Fall precautions discussed with patient. If an FOBT was given today- please return it to our front desk. If you are over 100 years old - you may need Prevnar 29 or the adult Pneumonia vaccine.  **Flu shots are available--- please call and schedule a FLU-CLINIC appointment**  After your visit with [place] today you will receive a survey in the mail or online from [employer] regarding your care with [place]. Please take a moment to fill this out. Your feedback is very  important to [place] as you can help [place] better understand your patient needs as well as improve your experience and satisfaction. WE CARE ABOUT YOU!!!  Continue to be careful and do not put yourself at risk for falling We will call with your DEXA scan results and lab work results as soon as these results become available

## 2017-08-11 LAB — BMP8+EGFR
BUN / CREAT RATIO: 15 (ref 12–28)
BUN: 14 mg/dL (ref 8–27)
CHLORIDE: 96 mmol/L (ref 96–106)
CO2: 26 mmol/L (ref 20–29)
Calcium: 10.5 mg/dL — ABNORMAL HIGH (ref 8.7–10.3)
Creatinine, Ser: 0.91 mg/dL (ref 0.57–1.00)
GFR calc Af Amer: 68 mL/min/{1.73_m2} (ref >59)
GFR, EST NON AFRICAN AMERICAN: 59 mL/min/{1.73_m2} — AB (ref >59)
Glucose: 117 mg/dL — ABNORMAL HIGH (ref 65–99)
POTASSIUM: 3.6 mmol/L (ref 3.5–5.2)
Sodium: 136 mmol/L (ref 134–144)

## 2017-08-11 LAB — HEPATIC FUNCTION PANEL
ALT: 19 IU/L (ref 0–32)
AST: 18 IU/L (ref 0–40)
Albumin: 4.3 g/dL (ref 3.5–4.7)
Alkaline Phosphatase: 67 IU/L (ref 39–117)
Bilirubin Total: 0.6 mg/dL (ref 0.0–1.2)
Bilirubin, Direct: 0.18 mg/dL (ref 0.00–0.40)
TOTAL PROTEIN: 6.5 g/dL (ref 6.0–8.5)

## 2017-08-11 LAB — CBC WITH DIFFERENTIAL/PLATELET
BASOS ABS: 0 10*3/uL (ref 0.0–0.2)
BASOS: 0 %
EOS (ABSOLUTE): 0.2 10*3/uL (ref 0.0–0.4)
Eos: 3 %
HEMOGLOBIN: 11.7 g/dL (ref 11.1–15.9)
Hematocrit: 33.6 % — ABNORMAL LOW (ref 34.0–46.6)
IMMATURE GRANS (ABS): 0 10*3/uL (ref 0.0–0.1)
Immature Granulocytes: 0 %
LYMPHS ABS: 2.1 10*3/uL (ref 0.7–3.1)
Lymphs: 43 %
MCH: 31.7 pg (ref 26.6–33.0)
MCHC: 34.8 g/dL (ref 31.5–35.7)
MCV: 91 fL (ref 79–97)
MONOCYTES: 7 %
Monocytes Absolute: 0.4 10*3/uL (ref 0.1–0.9)
NEUTROS ABS: 2.3 10*3/uL (ref 1.4–7.0)
Neutrophils: 47 %
Platelets: 281 10*3/uL (ref 150–379)
RBC: 3.69 x10E6/uL — ABNORMAL LOW (ref 3.77–5.28)
RDW: 13.1 % (ref 12.3–15.4)
WBC: 5 10*3/uL (ref 3.4–10.8)

## 2017-08-11 LAB — LIPID PANEL
CHOL/HDL RATIO: 3.3 ratio (ref 0.0–4.4)
Cholesterol, Total: 155 mg/dL (ref 100–199)
HDL: 47 mg/dL (ref >39)
LDL CALC: 76 mg/dL (ref 0–99)
Triglycerides: 160 mg/dL — ABNORMAL HIGH (ref 0–149)
VLDL CHOLESTEROL CAL: 32 mg/dL (ref 5–40)

## 2017-08-11 LAB — MICROALBUMIN / CREATININE URINE RATIO
Creatinine, Urine: 47.2 mg/dL
MICROALB/CREAT RATIO: 18.2 mg/g{creat} (ref 0.0–30.0)
MICROALBUM., U, RANDOM: 8.6 ug/mL

## 2017-08-11 LAB — VITAMIN D 25 HYDROXY (VIT D DEFICIENCY, FRACTURES): VIT D 25 HYDROXY: 44.7 ng/mL (ref 30.0–100.0)

## 2017-09-16 ENCOUNTER — Ambulatory Visit (INDEPENDENT_AMBULATORY_CARE_PROVIDER_SITE_OTHER): Payer: Medicare Other | Admitting: Pediatrics

## 2017-09-16 ENCOUNTER — Ambulatory Visit (INDEPENDENT_AMBULATORY_CARE_PROVIDER_SITE_OTHER): Payer: Medicare Other

## 2017-09-16 ENCOUNTER — Encounter: Payer: Self-pay | Admitting: Pediatrics

## 2017-09-16 VITALS — BP 117/60 | HR 60 | Temp 96.8°F | Ht 61.25 in | Wt 148.0 lb

## 2017-09-16 DIAGNOSIS — M179 Osteoarthritis of knee, unspecified: Secondary | ICD-10-CM | POA: Diagnosis not present

## 2017-09-16 DIAGNOSIS — M25561 Pain in right knee: Secondary | ICD-10-CM

## 2017-09-16 NOTE — Progress Notes (Signed)
  Subjective:   Patient ID: Theresa Watson, female    DOB: [birth year], 83 y.o.   MRN: [id number] CC: Knee Pain (right )  HPI: Theresa Watson is a 83 y.o. female presenting for Knee Pain (right )  Has been bothering her more over the past 3-4 weeks Here today with her daughter and granddaughter No injury that they know of No redness, has noticed right knee is more swollen compared to her left Hurts to bend it, put weight on it for long period of time Lives alone and has been able to do her regular ADLs  No fevers, otherwise has been feeling well  Relevant past medical, surgical, family and social history reviewed. Allergies and medications reviewed and updated. Social History   Tobacco Use  Smoking Status Former Smoker  . Packs/day: 0.50  . Types: Cigarettes  . Start date: 10/06/1953  . Last attempt to quit: 02/04/2012  . Years since quitting: 5.6  Smokeless Tobacco Never Used   ROS: Per HPI   Objective:    BP 117/60   Pulse 60   Temp (!) 96.8 F (36 C) (Oral)   Ht 5' 1.25" (1.556 m)   Wt 148 lb (67.1 kg)   BMI 27.74 kg/m   Wt Readings from Last 3 Encounters:  09/16/17 148 lb (67.1 kg)  08/10/17 145 lb (65.8 kg)  04/01/17 143 lb (64.9 kg)    Gen: NAD, alert, cooperative with exam, NCAT EYES: EOMI, no conjunctival injection, or no icterus ENT:  TMs pearly gray b/l, OP without erythema LYMPH: no cervical LAD CV: NRRR, normal S1/S2, no murmur, distal pulses 2+ b/l Resp: CTABL, no wheezes, normal WOB Abd: +BS, soft, NTND. no guarding or organomegaly Ext: No edema, warm Neuro: Alert and oriented, strength equal b/l UE and LE, coordination grossly normal MSK: Able to fully extend right knee, pain with flexion of right knee greater than 100 degrees Right knee with small effusion, no effusion left knee No redness in knees bilaterally Tender R knee lateral joint line, no tenderness L knee joint line  Assessment & Plan:  Theresa Watson was seen today for knee  pain.  Diagnoses and all orders for this visit:  Acute pain of right knee No injury, XR with possible chondrocalcinosis, also OA, discussed symptom care -     DG Knee 1-2 Views Right; Future -     Ambulatory referral to Orthopedic Surgery   Follow up plan: Return if symptoms worsen or fail to improve. Assunta Found, MD [place]

## 2017-09-18 DIAGNOSIS — M1711 Unilateral primary osteoarthritis, right knee: Secondary | ICD-10-CM | POA: Diagnosis not present

## 2017-09-18 DIAGNOSIS — M25561 Pain in right knee: Secondary | ICD-10-CM | POA: Diagnosis not present

## 2017-09-18 DIAGNOSIS — M25562 Pain in left knee: Secondary | ICD-10-CM | POA: Diagnosis not present

## 2017-11-02 DIAGNOSIS — M1711 Unilateral primary osteoarthritis, right knee: Secondary | ICD-10-CM | POA: Diagnosis not present

## 2017-11-19 ENCOUNTER — Other Ambulatory Visit: Payer: Self-pay | Admitting: Family Medicine

## 2017-12-07 DIAGNOSIS — G44219 Episodic tension-type headache, not intractable: Secondary | ICD-10-CM | POA: Diagnosis not present

## 2017-12-07 DIAGNOSIS — H40033 Anatomical narrow angle, bilateral: Secondary | ICD-10-CM | POA: Diagnosis not present

## 2018-01-11 ENCOUNTER — Ambulatory Visit (INDEPENDENT_AMBULATORY_CARE_PROVIDER_SITE_OTHER): Payer: Medicare Other | Admitting: Family Medicine

## 2018-01-11 ENCOUNTER — Encounter: Payer: Self-pay | Admitting: Family Medicine

## 2018-01-11 VITALS — BP 123/66 | HR 62 | Temp 97.1°F | Ht 61.25 in | Wt 147.0 lb

## 2018-01-11 DIAGNOSIS — E785 Hyperlipidemia, unspecified: Secondary | ICD-10-CM | POA: Diagnosis not present

## 2018-01-11 DIAGNOSIS — E1149 Type 2 diabetes mellitus with other diabetic neurological complication: Secondary | ICD-10-CM | POA: Diagnosis not present

## 2018-01-11 DIAGNOSIS — E118 Type 2 diabetes mellitus with unspecified complications: Secondary | ICD-10-CM

## 2018-01-11 DIAGNOSIS — R141 Gas pain: Secondary | ICD-10-CM

## 2018-01-11 DIAGNOSIS — H6122 Impacted cerumen, left ear: Secondary | ICD-10-CM

## 2018-01-11 DIAGNOSIS — M1711 Unilateral primary osteoarthritis, right knee: Secondary | ICD-10-CM

## 2018-01-11 DIAGNOSIS — I10 Essential (primary) hypertension: Secondary | ICD-10-CM

## 2018-01-11 DIAGNOSIS — E1143 Type 2 diabetes mellitus with diabetic autonomic (poly)neuropathy: Secondary | ICD-10-CM | POA: Diagnosis not present

## 2018-01-11 DIAGNOSIS — E1169 Type 2 diabetes mellitus with other specified complication: Secondary | ICD-10-CM | POA: Diagnosis not present

## 2018-01-11 DIAGNOSIS — E0843 Diabetes mellitus due to underlying condition with diabetic autonomic (poly)neuropathy: Secondary | ICD-10-CM | POA: Diagnosis not present

## 2018-01-11 DIAGNOSIS — E7800 Pure hypercholesterolemia, unspecified: Secondary | ICD-10-CM

## 2018-01-11 DIAGNOSIS — E559 Vitamin D deficiency, unspecified: Secondary | ICD-10-CM

## 2018-01-11 LAB — BAYER DCA HB A1C WAIVED: HB A1C: 6.3 % (ref <7.0)

## 2018-01-11 NOTE — Patient Instructions (Signed)
Medicare Annual Wellness Visit  [facility] and the medical providers at [facility] strive to bring you the best medical care.  In doing so we not only want to address your current medical conditions and concerns but also to detect new conditions early and prevent illness, disease and health-related problems.    Medicare offers a yearly Wellness Visit which allows our clinical staff to assess your need for preventative services including immunizations, lifestyle education, counseling to decrease risk of preventable diseases and screening for fall risk and other medical concerns.    This visit is provided free of charge (no copay) for all Medicare recipients. The clinical pharmacists at [facility] have begun to conduct these Wellness Visits which will also include a thorough review of all your medications.    As you primary medical provider recommend that you make an appointment for your Annual Wellness Visit if you have not done so already this year.  You may set up this appointment before you leave today or you may call back ([phone]) and schedule an appointment.  Please make sure when you call that you mention that you are scheduling your Annual Wellness Visit with the clinical pharmacist so that the appointment may be made for the proper length of time.     Continue current medications. Continue good therapeutic lifestyle changes which include good diet and exercise. Fall precautions discussed with patient. If an FOBT was given today- please return it to our front desk. If you are over 50 years old - you may need Prevnar 13 or the adult Pneumonia vaccine.  **Flu shots are available--- please call and schedule a FLU-CLINIC appointment**  After your visit with us today you will receive a survey in the mail or online from [employer] regarding your care with us. Please take a moment to fill this out. Your feedback is very  important to us as you can help us better understand your patient needs as well as improve your experience and satisfaction. WE CARE ABOUT YOU!!!    

## 2018-01-11 NOTE — Progress Notes (Signed)
Subjective:    Patient ID: Theresa Watson, female    DOB: 1933-02-13, 84 y.o.   MRN: [id number]  HPI Pt here for follow up and management of chronic medical problems which includes hypertension and diabetes. She is taking medication regularly.  The patient is doing well overall and continues to have problems with her right knee she is being followed regularly by the orthopedist.  She will be given an FOBT to return and will get lab work today.  Her vital signs are stable and her weight is down 1 pound.  She does bring in blood sugars for review and overall these are excellent being a diabetic with fasting blood sugars running in the upper 1 teen level and the highest blood sugar seems to be about 149 163 at bedtime.  These readings will be scanned into the record.  The patient is pleasant and smiling and feeling well.  She comes to the visit today with her granddaughter.  She is having problems with her right knee secondary to being on her knees back in November.  She did see an orthopedic specialist, Dr. Nickola Major in [place].  He is put an injection in the knee and says she has bone on bone in this knee.  She was not given a specific time to return but was told to come back if she continued to have problems.  She denies any chest pain pressure tightness or shortness of breath.  She denies any trouble with swallowing heartburn indigestion nausea vomiting diarrhea or blood in the stool.  She is taking iron and her stools are dark.  She is passing her water without problems.  She just had her eye exam last week.  Her blood sugars are scanned into the record for review.  Patient does complain of a lot of gas and abdominal discomfort.  We will continue to monitor this.     Patient Active Problem List   Diagnosis Date Noted  . Diabetes mellitus due to underlying condition with diabetic autonomic neuropathy, without long-term current use of insulin ([place]) 09/18/2015  . Annual physical exam  02/26/2015  . Type 2 diabetes mellitus with neurological manifestations, controlled ([place]) 08/29/2013  . Hypertension 11/13/2012  . Cataracts, bilateral 11/13/2012  . Symptomatic menopausal or female climacteric states   . Hyperlipidemia associated with type 2 diabetes mellitus ([place])   . Esophagitis   . 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 Obesity, mild   . IBS (irritable bowel syndrome)   . Kidney stone   . Lumbosacral spondylosis without myelopathy   . Pain in joint, shoulder region   . Colon polyp   . Diverticulosis    Outpatient Encounter Medications as of 01/11/2018  Medication Sig  . ACCU-CHEK AVIVA PLUS test strip TEST TWO TIMES DAILY AS  DIRECTED  . aspirin 81 MG EC tablet Take 81 mg by mouth daily.    . Calcium Carb-Cholecalciferol (CALCIUM 600 + D PO) Take by mouth.  . Cholecalciferol (VITAMIN D3) 2000 UNITS TABS Take 1 tablet by mouth daily.    . ferrous sulfate 325 (65 FE) MG EC tablet Take 325 mg by mouth daily with breakfast.    . gabapentin (NEURONTIN) 100 MG capsule TAKE 3 CAPSULES BY MOUTH AT BEDTIME  . Garlic 917 MG TABS Take 1 tablet by mouth 2 (two) times daily.   Marland Kitchen geriatric multivitamins-minerals (ELDERTONIC/GEVRABON) ELIX Take 15 mLs by mouth daily.    . Omega-3 Fatty Acids (FISH OIL)  1000 MG CAPS Take 2 capsules by mouth daily.   Marland Kitchen omeprazole (PRILOSEC) 20 MG capsule Take 20 mg by mouth daily.    Marland Kitchen OVER THE COUNTER MEDICATION Vit C, vit E, green tea pill, garlic, vit I09  . potassium chloride SA (K-DUR,KLOR-CON) 20 MEQ tablet TAKE 1 TABLET BY MOUTH  DAILY  . pravastatin (PRAVACHOL) 80 MG tablet TAKE 1 TABLET BY MOUTH  DAILY  . triamterene-hydrochlorothiazide (MAXZIDE-25) 37.5-25 MG tablet TAKE 1 TABLET BY MOUTH  DAILY   No facility-administered encounter medications on file as of 01/11/2018.      Review of Systems  Constitutional: Negative.   HENT: Negative.   Eyes: Negative.   Respiratory: Negative.   Cardiovascular: Negative.    Gastrointestinal: Negative.   Endocrine: Negative.   Genitourinary: Negative.   Musculoskeletal: Positive for arthralgias (right knee pain - folled by ortho ).  Skin: Negative.   Allergic/Immunologic: Negative.   Neurological: Negative.   Hematological: Negative.   Psychiatric/Behavioral: Negative.        Objective:   Physical Exam  Constitutional: She is oriented to person, place, and time. She appears well-developed and well-nourished. No distress.  Patient is pleasant and relaxed and in good spirits.  HENT:  Head: Normocephalic and atraumatic.  Right Ear: External ear normal.  Nose: Nose normal.  Mouth/Throat: Oropharynx is clear and moist. No oropharyngeal exudate.  Cerumen left ear canal  Eyes: Pupils are equal, round, and reactive to light. Conjunctivae and EOM are normal. Right eye exhibits no discharge. Left eye exhibits no discharge. No scleral icterus.  Recent visit to ophthalmologist  Neck: Normal range of motion. Neck supple. No thyromegaly present.  No bruits thyromegaly or anterior cervical adenopathy  Cardiovascular: Normal rate, regular rhythm, normal heart sounds and intact distal pulses.  No murmur heard. Heart is regular at 60/min  Pulmonary/Chest: Effort normal and breath sounds normal. No respiratory distress. She has no wheezes. She has no rales.  Clear anteriorly and posteriorly  Abdominal: Soft. Bowel sounds are normal. She exhibits no mass. There is no tenderness. There is no rebound and no guarding.  No liver or spleen enlargement.  No epigastric tenderness.  No bruits.  No inguinal adenopathy with good inguinal pulses.  Musculoskeletal: Normal range of motion. She exhibits no edema.  Bilateral bunions with left being worse than the right.  Lymphadenopathy:    She has no cervical adenopathy.  Neurological: She is alert and oriented to person, place, and time. She has normal reflexes. No cranial nerve deficit.  Skin: Skin is warm and dry. No rash  noted.  Psychiatric: She has a normal mood and affect. Her behavior is normal. Judgment and thought content normal.  Nursing note and vitals reviewed.  BP 123/66 (BP Location: Left Arm)   Pulse 62   Temp (!) 97.1 F (36.2 C) (Oral)   Ht 5' 1.25" (1.556 m)   Wt 147 lb (66.7 kg)   BMI 27.55 kg/m        Assessment & Plan:  1. Essential hypertension -The blood pressure is excellent today and she will continue with current treatment - CBC with Differential/Platelet - BMP8+EGFR - Hepatic function panel  2. Type 2 diabetes mellitus with neurological manifestations, controlled ([place]) -Continue with current treatment pending results of lab work - CBC with Differential/Platelet - Bayer [place] Hb A1c Waived  3. Pure hypercholesterolemia -Continue with pravastatin and as aggressive therapeutic lifestyle changes as possible pending results of lab work - CBC with Differential/Platelet - Lipid panel  4. Vitamin D deficiency -Continue with vitamin D replacement - CBC with Differential/Platelet - VITAMIN D 25 Hydroxy (Vit-D Deficiency, Fractures)  5. Diabetic autonomic neuropathy associated with type 2 diabetes mellitus ([place]) -Check feet regularly  6. Hyperlipidemia associated with type 2 diabetes mellitus ([place]) -Continue with aggressive therapeutic lifestyle changes  7. Abdominal gas pain -If problems continue, consider getting CT scan of abdomen especially in light of her only 2 siblings having pancreatic cancer.++++  8. Primary osteoarthritis of right knee -Follow-up with orthopedist as planned  9. Diabetes mellitus due to underlying condition with diabetic autonomic neuropathy, without long-term current use of insulin ([facility]) -Continue as aggressive therapeutic lifestyle changes as possible including diet and exercise  10. Type 2 diabetes mellitus with complication, without long-term current use of insulin ([place]) -New current treatment  11. Cerumen debris on tympanic membrane of  left ear -Ear irrigation to remove cerumen  Patient Instructions                       Medicare Annual Wellness Visit  [facility] and the medical providers at [facility] strive to bring you the best medical care.  In doing so we not only want to address your current medical conditions and concerns but also to detect new conditions early and prevent illness, disease and health-related problems.    Medicare offers a yearly Wellness Visit which allows our clinical staff to assess your need for preventative services including immunizations, lifestyle education, counseling to decrease risk of preventable diseases and screening for fall risk and other medical concerns.    This visit is provided free of charge (no copay) for all Medicare recipients. The clinical pharmacists at [facility] have begun to conduct these Wellness Visits which will also include a thorough review of all your medications.    As you primary medical provider recommend that you make an appointment for your Annual Wellness Visit if you have not done so already this year.  You may set up this appointment before you leave today or you may call back ([phone]) and schedule an appointment.  Please make sure when you call that you mention that you are scheduling your Annual Wellness Visit with the clinical pharmacist so that the appointment may be made for the proper length of time.     Continue current medications. Continue good therapeutic lifestyle changes which include good diet and exercise. Fall precautions discussed with patient. If an FOBT was given today- please return it to our front desk. If you are over 38 years old - you may need Prevnar 43 or the adult Pneumonia vaccine.  **Flu shots are available--- please call and schedule a FLU-CLINIC appointment**  After your visit with [place] today you will receive a survey in the mail or online from [employer] regarding your care with  [place]. Please take a moment to fill this out. Your feedback is very important to [place] as you can help [place] better understand your patient needs as well as improve your experience and satisfaction. WE CARE ABOUT YOU!!!     Arrie Senate MD

## 2018-01-12 LAB — BMP8+EGFR
BUN / CREAT RATIO: 18 (ref 12–28)
BUN: 14 mg/dL (ref 8–27)
CHLORIDE: 94 mmol/L — AB (ref 96–106)
CO2: 26 mmol/L (ref 20–29)
CREATININE: 0.77 mg/dL (ref 0.57–1.00)
Calcium: 10.2 mg/dL (ref 8.7–10.3)
GFR calc non Af Amer: 72 mL/min/{1.73_m2} (ref >59)
GFR, EST AFRICAN AMERICAN: 83 mL/min/{1.73_m2} (ref >59)
GLUCOSE: 120 mg/dL — AB (ref 65–99)
Potassium: 3.6 mmol/L (ref 3.5–5.2)
SODIUM: 136 mmol/L (ref 134–144)

## 2018-01-12 LAB — CBC WITH DIFFERENTIAL/PLATELET
BASOS ABS: 0 10*3/uL (ref 0.0–0.2)
BASOS: 0 %
EOS (ABSOLUTE): 0.2 10*3/uL (ref 0.0–0.4)
Eos: 2 %
Hematocrit: 36.4 % (ref 34.0–46.6)
Hemoglobin: 12.6 g/dL (ref 11.1–15.9)
IMMATURE GRANS (ABS): 0 10*3/uL (ref 0.0–0.1)
Immature Granulocytes: 0 %
LYMPHS ABS: 2.3 10*3/uL (ref 0.7–3.1)
LYMPHS: 37 %
MCH: 31.5 pg (ref 26.6–33.0)
MCHC: 34.6 g/dL (ref 31.5–35.7)
MCV: 91 fL (ref 79–97)
Monocytes Absolute: 0.4 10*3/uL (ref 0.1–0.9)
Monocytes: 7 %
NEUTROS ABS: 3.3 10*3/uL (ref 1.4–7.0)
Neutrophils: 54 %
PLATELETS: 288 10*3/uL (ref 150–379)
RBC: 4 x10E6/uL (ref 3.77–5.28)
RDW: 12.9 % (ref 12.3–15.4)
WBC: 6.2 10*3/uL (ref 3.4–10.8)

## 2018-01-12 LAB — HEPATIC FUNCTION PANEL
ALT: 22 IU/L (ref 0–32)
AST: 18 IU/L (ref 0–40)
Albumin: 4.4 g/dL (ref 3.5–4.7)
Alkaline Phosphatase: 69 IU/L (ref 39–117)
Bilirubin Total: 0.6 mg/dL (ref 0.0–1.2)
Bilirubin, Direct: 0.16 mg/dL (ref 0.00–0.40)
TOTAL PROTEIN: 6.3 g/dL (ref 6.0–8.5)

## 2018-01-12 LAB — LIPID PANEL
Chol/HDL Ratio: 3.5 ratio (ref 0.0–4.4)
Cholesterol, Total: 165 mg/dL (ref 100–199)
HDL: 47 mg/dL (ref >39)
LDL CALC: 81 mg/dL (ref 0–99)
Triglycerides: 186 mg/dL — ABNORMAL HIGH (ref 0–149)
VLDL CHOLESTEROL CAL: 37 mg/dL (ref 5–40)

## 2018-01-12 LAB — VITAMIN D 25 HYDROXY (VIT D DEFICIENCY, FRACTURES): VIT D 25 HYDROXY: 50.1 ng/mL (ref 30.0–100.0)

## 2018-02-03 ENCOUNTER — Encounter: Payer: Self-pay | Admitting: *Deleted

## 2018-02-03 ENCOUNTER — Ambulatory Visit (INDEPENDENT_AMBULATORY_CARE_PROVIDER_SITE_OTHER): Payer: Medicare Other | Admitting: *Deleted

## 2018-02-03 VITALS — BP 108/54 | HR 64 | Ht 59.5 in | Wt 149.0 lb

## 2018-02-03 DIAGNOSIS — Z0000 Encounter for general adult medical examination without abnormal findings: Secondary | ICD-10-CM | POA: Diagnosis not present

## 2018-02-03 DIAGNOSIS — Z1211 Encounter for screening for malignant neoplasm of colon: Secondary | ICD-10-CM | POA: Diagnosis not present

## 2018-02-03 NOTE — Progress Notes (Addendum)
Subjective:   Theresa Watson is a 84 y.o. female who presents for a subsequent Medicare Annual Wellness Visit. Theresa Watson is accompanied today by her granddaughter. She has been divorced for 40+ years and lives at home alone. Her daughter and granddaughter help out a lot. Her laundry is in her basement but her daughter comes over to wash her clothes and she hasn't gone into the basement since they asked her not to. She has 3 adult sons and 2 adult daughters. 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  Review of Systems    Health is about the same as last year.   Cardiac Risk Factors include: diabetes mellitus;hypertension;dyslipidemia     Objective:    Today's Vitals   02/03/18 1111 02/03/18 1113  BP: (!) 108/54   Pulse: 64   Weight: 149 lb (67.6 kg)   Height: 4' 11.5" (1.511 m)   PainSc:  2    Body mass index is 29.59 kg/m.  Advanced Directives 02/02/2017  Does Patient Have a Medical Advance Directive? No  Would patient like information on creating a medical advance directive? Yes (MAU/Ambulatory/Procedural Areas - Information given)    Current Medications (verified) Outpatient Encounter Medications as of 02/03/2018  Medication Sig  . ACCU-CHEK AVIVA PLUS test strip TEST TWO TIMES DAILY AS  DIRECTED  . aspirin 81 MG EC tablet Take 81 mg by mouth daily.    . Calcium Carb-Cholecalciferol (CALCIUM 600 + D PO) Take by mouth.  . Cholecalciferol (VITAMIN D3) 2000 UNITS TABS Take 1 tablet by mouth daily.    . ferrous sulfate 325 (65 FE) MG EC tablet Take 325 mg by mouth daily with breakfast.    . Garlic 161 MG TABS Take 1 tablet by mouth 2 (two) times daily.   Marland Kitchen geriatric multivitamins-minerals (ELDERTONIC/GEVRABON) ELIX Take 15 mLs by mouth daily.    . Omega-3 Fatty Acids (FISH OIL) 1000 MG CAPS Take 2 capsules by mouth daily.   Marland Kitchen omeprazole (PRILOSEC) 20 MG capsule Take 20 mg by mouth daily.    Marland Kitchen OVER THE COUNTER MEDICATION  Vit C, vit E, green tea pill, garlic, vit W96  . [DISCONTINUED] gabapentin (NEURONTIN) 100 MG capsule TAKE 3 CAPSULES BY MOUTH AT BEDTIME  . [DISCONTINUED] potassium chloride SA (K-DUR,KLOR-CON) 20 MEQ tablet TAKE 1 TABLET BY MOUTH  DAILY  . [DISCONTINUED] pravastatin (PRAVACHOL) 80 MG tablet TAKE 1 TABLET BY MOUTH  DAILY  . [DISCONTINUED] triamterene-hydrochlorothiazide (MAXZIDE-25) 37.5-25 MG tablet TAKE 1 TABLET BY MOUTH  DAILY   No facility-administered encounter medications on file as of 02/03/2018.     Allergies (verified) Levaquin [levofloxacin]; Cephalexin; Penicillins; and Lisinopril   History: Past Medical History:  Diagnosis Date  . Colon polyp   . Diabetes mellitus without complication ([place])   . Diverticulosis   . Esophagitis   . IBS (irritable bowel syndrome)   . Kidney stone    hx of 1 kidney stone  . Lumbosacral spondylosis without myelopathy   . Obesity, mild   . Other and unspecified hyperlipidemia   . Pain in joint, shoulder region   . 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 Symptomatic menopausal or female climacteric states    Past Surgical History:  Procedure Laterality Date  . CARPAL TUNNEL RELEASE    . CHOLECYSTECTOMY    . EYE SURGERY Bilateral    cataracts  . lumbar back surgery    .  VAGINAL HYSTERECTOMY     partial   Family History  Problem Relation Age of Onset  . Cancer Mother        brain  . Brain cancer Mother   . GI Bleed Father   . Arthritis Sister        rheumatoid  . Lymphoma Sister   . Pancreatic cancer Brother   . Diabetes Son   . Arthritis Son        back  . Cancer Son        prostate  . Cancer Maternal Grandfather   . Suicidality Paternal Grandfather   . Arthritis Son        back  . Hyperlipidemia Son   . Colitis Son    Social History   Socioeconomic History  . Marital status: Divorced    Spouse name: Not on file  . Number of children: 5  . Years of education: GED  . Highest education  level: GED or equivalent  Occupational History  . Occupation: retired  Scientific laboratory technician  . Financial resource strain: Not hard at all  . Food insecurity:    Worry: Never true    Inability: Never true  . Transportation needs:    Medical: No    Non-medical: No  Tobacco Use  . Smoking status: Former Smoker    Packs/day: 0.50    Types: Cigarettes    Start date: 10/06/1953    Last attempt to quit: 02/04/2012    Years since quitting: 6.0  . Smokeless tobacco: Never Used  Substance and Sexual Activity  . Alcohol use: No  . Drug use: No  . Sexual activity: Not Currently  Lifestyle  . Physical activity:    Days per week: Not on file    Minutes per session: Not on file  . Stress: Not on file  Relationships  . Social connections:    Talks on phone: More than three times a week    Gets together: More than three times a week    Attends religious service: More than 4 times per year    Active member of club or organization: Yes    Attends meetings of clubs or organizations: More than 4 times per year    Relationship status: Divorced  Other Topics Concern  . Not on file  Social History Narrative  . Not on file    Tobacco Counseling No tobacco use  Clinical Intake:  Pre-visit preparation completed: No  Pain : 0-10 Pain Score: 2  Pain Type: Chronic pain Pain Location: Knee Pain Orientation: Right Pain Descriptors / Indicators: Aching Pain Onset: More than a month ago Pain Frequency: Intermittent Pain Relieving Factors: takes tylenol, ibuprofen, and uses a topical muscle rub Effect of Pain on Daily Activities: minimal-works through pain  Pain Relieving Factors: takes tylenol, ibuprofen, and uses a topical muscle rub  Nutritional Status: BMI 25 -29 Overweight Diabetes: No  What is the last grade level you completed in school?: finished 10th grade and obtained diploma in her 4s  Interpreter Needed?: No  Information entered by :: Chong Sicilian, RN   Activities of Daily  Living In your present state of health, do you have any difficulty performing the following activities: 02/03/2018  Hearing? Y  Comment has some difficulty with hearing and has more trouble with background noise  Vision? N  Comment last eye exam was 2 months ago  Difficulty concentrating or making decisions? N  Walking or climbing stairs? Y  Comment has some right knee  pain. Family has asked her not to walk down basement stairs but she can navigate them if needed  Dressing or bathing? N  Doing errands, shopping? N  Preparing Food and eating ? N  Using the Toilet? N  In the past six months, have you accidently leaked urine? N  Do you have problems with loss of bowel control? N  Managing your Medications? N  Managing your Finances? N  Housekeeping or managing your Housekeeping? N  Some recent data might be hidden     Immunizations and Health Maintenance Immunization History  Administered Date(s) Administered  . Influenza Split 06/24/2012  . Influenza Whole 07/06/2008  . Influenza, High Dose Seasonal PF 07/02/2016, 07/07/2017  . Influenza,inj,Quad PF,6+ Mos 07/06/2013, 07/05/2014, 07/05/2015  . Pneumococcal Conjugate-13 08/29/2013  . Pneumococcal Polysaccharide-23 03/06/2004  . Td 03/06/2004  . Tdap 07/06/2009  . Zoster 08/07/2008   There are no preventive care reminders to display for this patient.  Patient Care Team: [name], MD as PCP - General (Family Medicine) Clarene Essex, MD as Consulting Physician (Gastroenterology) Allyn Kenner, MD (Dermatology) Harlen Labs, MD as Referring Physician (Optometry)  Seen by ortho with Doreatha Lew in [place] at the end of 2018 for knee pain. Had injection that has been doing well and followed up in January.   No hospitalizations, ER visits, or surgeries this past year.      Assessment:   This is a routine wellness examination for [place].  Hearing/Vision screen No deficits noted during visit.  Dietary issues  and exercise activities discussed:  Diet Eats mainly quick meals and eats out a few times a week. Likes peanut butter and crackers  Current Exercise Habits: Home exercise routine, Type of exercise: walking;strength training/weights, Time (Minutes): 20, Frequency (Times/Week): 3, Weekly Exercise (Minutes/Week): 60, Intensity: Mild, Exercise limited by: orthopedic condition(s)  Goals    . Exercise 150 min/wk Moderate Activity      Depression Screen PHQ 2/9 Scores 02/03/2018 01/11/2018 09/16/2017 08/10/2017 04/01/2017 02/02/2017 11/12/2016  PHQ - 2 Score 0 0 0 0 0 0 0    Fall Risk Fall Risk  02/03/2018 01/11/2018 09/16/2017 08/10/2017 04/01/2017  Falls in the past year? Yes Yes Yes Yes No  Comment fell outdoors - - - -  Number falls in past yr: 1 1 1 1  -  Injury with Fall? No No No No -  Comment scratched leg - - - -  Risk for fall due to : History of fall(s) - - - -  Follow up Falls prevention discussed - - - -    Is the patient's home free of loose throw rugs in walkways, pet beds, electrical cords, etc?   yes      Grab bars in the bathroom? no      Handrails on the stairs?   yes      Adequate lighting?   yes   Cognitive Function: MMSE - Mini Mental State Exam 02/03/2018 02/02/2017  Orientation to time 5 5  Orientation to Place 5 5  Registration 3 3  Attention/ Calculation 4 4  Recall 2 3  Language- name 2 objects 2 2  Language- repeat 1 1  Language- follow 3 step command 3 3  Language- read & follow direction 1 1  Write a sentence 1 1  Copy design 1 1  Total score 28 29        Screening Tests Health Maintenance  Topic Date Due  . INFLUENZA VACCINE  05/06/2018  .  HEMOGLOBIN A1C  07/13/2018  . URINE MICROALBUMIN  08/10/2018  . OPHTHALMOLOGY EXAM  12/12/2018  . FOOT EXAM  01/12/2019  . TETANUS/TDAP  07/07/2019  . DEXA SCAN  Completed  . PNA vac Low Risk Adult  Completed      Plan:  Keep f/u with PCP Continue to stay active. Aim for at least 150 minutes of moderate  activity a week.  Move carefully to avoid falls.   I have personally reviewed and noted the following in the patient's chart:   . Medical and social history . Use of alcohol, tobacco or illicit drugs  . Current medications and supplements . Functional ability and status . Nutritional status . Physical activity . Advanced directives . List of other physicians . Hospitalizations, surgeries, and ER visits in previous 12 months . Vitals . Screenings to include cognitive, depression, and falls . Referrals and appointments  In addition, I have reviewed and discussed with patient certain preventive protocols, quality metrics, and best practice recommendations. A written personalized care plan for preventive services as well as general preventive health recommendations were provided to patient.     Ilean China, RN   02/03/2018 I have reviewed and agree with the above AWV documentation.   Arrie Senate MD

## 2018-02-03 NOTE — Patient Instructions (Signed)
  Ms. Rhome , Thank you for taking time to come for your Medicare Wellness Visit. I appreciate your ongoing commitment to your health goals. Please review the following plan we discussed and let me know if I can assist you in the future.   These are the goals we discussed: Goals    . Exercise 150 min/wk Moderate Activity       This is a list of the screening recommended for you and due dates:  Health Maintenance  Topic Date Due  . Flu Shot  05/06/2018  . Hemoglobin A1C  07/13/2018  . Urine Protein Check  08/10/2018  . Eye exam for diabetics  12/12/2018  . Complete foot exam   01/12/2019  . Tetanus Vaccine  07/07/2019  . DEXA scan (bone density measurement)  Completed  . Pneumonia vaccines  Completed

## 2018-02-06 LAB — FECAL OCCULT BLOOD, IMMUNOCHEMICAL: Fecal Occult Bld: POSITIVE — AB

## 2018-02-08 ENCOUNTER — Telehealth: Payer: Self-pay | Admitting: Family Medicine

## 2018-02-08 ENCOUNTER — Other Ambulatory Visit: Payer: Self-pay | Admitting: *Deleted

## 2018-02-08 DIAGNOSIS — R195 Other fecal abnormalities: Secondary | ICD-10-CM

## 2018-02-08 NOTE — Telephone Encounter (Signed)
Aware of results. FOBT and Hgb ordered

## 2018-02-08 NOTE — Progress Notes (Signed)
Aware of results. Hgb and fobt ordered

## 2018-02-10 ENCOUNTER — Other Ambulatory Visit: Payer: Self-pay | Admitting: Family Medicine

## 2018-02-17 ENCOUNTER — Other Ambulatory Visit: Payer: Medicare Other

## 2018-02-17 DIAGNOSIS — R195 Other fecal abnormalities: Secondary | ICD-10-CM

## 2018-02-17 LAB — HEMOGLOBIN, FINGERSTICK: HEMOGLOBIN: 11.6 g/dL (ref 11.1–15.9)

## 2018-02-18 LAB — FECAL OCCULT BLOOD, IMMUNOCHEMICAL: FECAL OCCULT BLD: NEGATIVE

## 2018-05-17 ENCOUNTER — Other Ambulatory Visit: Payer: Self-pay | Admitting: Family Medicine

## 2018-05-19 ENCOUNTER — Encounter: Payer: Self-pay | Admitting: Family Medicine

## 2018-05-19 ENCOUNTER — Ambulatory Visit (INDEPENDENT_AMBULATORY_CARE_PROVIDER_SITE_OTHER): Payer: Medicare Other | Admitting: Family Medicine

## 2018-05-19 VITALS — BP 115/72 | HR 62 | Temp 97.5°F | Ht 59.5 in | Wt 150.0 lb

## 2018-05-19 DIAGNOSIS — E1143 Type 2 diabetes mellitus with diabetic autonomic (poly)neuropathy: Secondary | ICD-10-CM

## 2018-05-19 DIAGNOSIS — M25561 Pain in right knee: Secondary | ICD-10-CM

## 2018-05-19 DIAGNOSIS — I10 Essential (primary) hypertension: Secondary | ICD-10-CM

## 2018-05-19 DIAGNOSIS — E559 Vitamin D deficiency, unspecified: Secondary | ICD-10-CM

## 2018-05-19 DIAGNOSIS — E7800 Pure hypercholesterolemia, unspecified: Secondary | ICD-10-CM | POA: Diagnosis not present

## 2018-05-19 DIAGNOSIS — E1140 Type 2 diabetes mellitus with diabetic neuropathy, unspecified: Secondary | ICD-10-CM | POA: Diagnosis not present

## 2018-05-19 DIAGNOSIS — E1149 Type 2 diabetes mellitus with other diabetic neurological complication: Secondary | ICD-10-CM

## 2018-05-19 DIAGNOSIS — M1711 Unilateral primary osteoarthritis, right knee: Secondary | ICD-10-CM

## 2018-05-19 DIAGNOSIS — G8929 Other chronic pain: Secondary | ICD-10-CM

## 2018-05-19 LAB — BAYER DCA HB A1C WAIVED: HB A1C (BAYER DCA - WAIVED): 6.3 % (ref <7.0)

## 2018-05-19 MED ORDER — GABAPENTIN 100 MG PO CAPS: ORAL_CAPSULE | ORAL | 3 refills | Status: DC

## 2018-05-19 NOTE — Patient Instructions (Addendum)
Medicare Annual Wellness Visit  [facility] and the medical providers at [facility] strive to bring you the best medical care.  In doing so we not only want to address your current medical conditions and concerns but also to detect new conditions early and prevent illness, disease and health-related problems.    Medicare offers a yearly Wellness Visit which allows our clinical staff to assess your need for preventative services including immunizations, lifestyle education, counseling to decrease risk of preventable diseases and screening for fall risk and other medical concerns.    This visit is provided free of charge (no copay) for all Medicare recipients. The clinical pharmacists at [facility] have begun to conduct these Wellness Visits which will also include a thorough review of all your medications.    As you primary medical provider recommend that you make an appointment for your Annual Wellness Visit if you have not done so already this year.  You may set up this appointment before you leave today or you may call back ([phone]) and schedule an appointment.  Please make sure when you call that you mention that you are scheduling your Annual Wellness Visit with the clinical pharmacist so that the appointment may be made for the proper length of time.     Continue current medications. Continue good therapeutic lifestyle changes which include good diet and exercise. Fall precautions discussed with patient. If an FOBT was given today- please return it to our front desk. If you are over 40 years old - you may need Prevnar 41 or the adult Pneumonia vaccine.  **Flu shots are available--- please call and schedule a FLU-CLINIC appointment**  After your visit with [place] today you will receive a survey in the mail or online from [employer] regarding your care with [place]. Please take a moment to fill this out. Your feedback is very  important to [place] as you can help [place] better understand your patient needs as well as improve your experience and satisfaction. WE CARE ABOUT YOU!!!   Follow-up with orthopedic surgeon as planned Continue to get yearly eye exams Check feet daily Increase gabapentin and take 100 mg in the morning in addition to 300 mg at night We will call with lab work results including a magnesium and B12 level.

## 2018-05-19 NOTE — Addendum Note (Signed)
Addended by: Zannie Cove on: 05/19/2018 09:56 AM   Modules accepted: Orders

## 2018-05-19 NOTE — Progress Notes (Signed)
Subjective:    Patient ID: Theresa Watson, female    DOB: 1933/09/19, 84 y.o.   MRN: [id number]  HPI Pt here for follow up and management of chronic medical problems which includes diabetes, hypertension and hyperlipidemia. She is taking medication regularly.  Patient today is complaining with some right knee pain and has seen a Dr. Nickola Major and plans to call him back for recheck of this.  She does not want to do anything about this.  She is due to get a pelvic exam and will get lab work today.  Her vital signs are stable.  She comes to the visit today with her daughter.  She brings in blood sugars for review and overall these look good and they seem to be running throughout the day and fasting in the 122 140 range overall.  These will be scanned into the record.  The patient is pleasant and alert and comes to the visit today with her granddaughter.  She is been having a lot of trouble with the right knee and 1 of her daughters who lives in [place] is taking her to see an orthopedic surgeon there and she has been told that she has end-stage right knee disease and may need a knee replacement.  The patient today denies any symptoms of chest pain pressure tightness or shortness of breath.  She denies any trouble with swallowing heartburn indigestion nausea vomiting diarrhea or blood in the stool other than what would be normal with some occasional loose stools and nausea.  No more than usual.  She is passing her water well.  She is up-to-date on her yearly eye exams.  She continues to have tingling in her feet or neuropathy from the diabetes and she is currently taking gabapentin 300 mg daily at bedtime and we will have her increase this and at 100 mg in the morning.    Patient Active Problem List   Diagnosis Date Noted  . Diabetes mellitus due to underlying condition with diabetic autonomic neuropathy, without long-term current use of insulin ([place]) 09/18/2015  . Annual physical exam 02/26/2015    . Type 2 diabetes mellitus with neurological manifestations, controlled ([place]) 08/29/2013  . Hypertension 11/13/2012  . Cataracts, bilateral 11/13/2012  . Symptomatic menopausal or female climacteric states   . Hyperlipidemia associated with type 2 diabetes mellitus ([place])   . Esophagitis   . 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 Obesity, mild   . IBS (irritable bowel syndrome)   . Kidney stone   . Lumbosacral spondylosis without myelopathy   . Pain in joint, shoulder region   . Colon polyp   . Diverticulosis    Outpatient Encounter Medications as of 05/19/2018  Medication Sig  . ACCU-CHEK AVIVA PLUS test strip TEST TWO TIMES DAILY AS  DIRECTED  . aspirin 81 MG EC tablet Take 81 mg by mouth daily.    . Calcium Carb-Cholecalciferol (CALCIUM 600 + D PO) Take by mouth.  . Cholecalciferol (VITAMIN D3) 2000 UNITS TABS Take 1 tablet by mouth daily.    . ferrous sulfate 325 (65 FE) MG EC tablet Take 325 mg by mouth daily with breakfast.    . gabapentin (NEURONTIN) 100 MG capsule TAKE 3 CAPSULES BY MOUTH AT BEDTIME  . Garlic 401 MG TABS Take 1 tablet by mouth 2 (two) times daily.   Marland Kitchen geriatric multivitamins-minerals (ELDERTONIC/GEVRABON) ELIX Take 15 mLs by mouth daily.    . Omega-3 Fatty Acids (FISH OIL) 1000  MG CAPS Take 2 capsules by mouth daily.   . omeprazole (PRILOSEC) 20 MG capsule Take 20 mg by mouth daily.    . OVER THE COUNTER MEDICATION Vit C, vit E, green tea pill, garlic, vit B12  . potassium chloride SA (K-DUR,KLOR-CON) 20 MEQ tablet TAKE 1 TABLET BY MOUTH  DAILY  . pravastatin (PRAVACHOL) 80 MG tablet TAKE 1 TABLET BY MOUTH  DAILY  . triamterene-hydrochlorothiazide (MAXZIDE-25) 37.5-25 MG tablet TAKE 1 TABLET BY MOUTH  DAILY   No facility-administered encounter medications on file as of 05/19/2018.      Review of Systems  Constitutional: Negative.   HENT: Negative.   Eyes: Negative.   Respiratory: Negative.   Cardiovascular: Negative.    Gastrointestinal: Negative.   Endocrine: Negative.   Genitourinary: Negative.   Musculoskeletal: Positive for arthralgias (right knee pain - following [facility] - Dr Satterfield).  Skin: Negative.   Allergic/Immunologic: Negative.   Neurological: Positive for numbness (feet ).  Hematological: Negative.   Psychiatric/Behavioral: Negative.        Doesn't want to get out and go       Objective:   Physical Exam  Constitutional: She is oriented to person, place, and time. She appears well-developed and well-nourished.  The patient is pleasant and doing well and according to her granddaughter seems to become a little bit more reclusive and less wanting to go out mostly because of the ongoing problems with her right knee.  HENT:  Head: Normocephalic and atraumatic.  Right Ear: External ear normal.  Left Ear: External ear normal.  Nose: Nose normal.  Mouth/Throat: Oropharynx is clear and moist.  Eyes: Pupils are equal, round, and reactive to light. Conjunctivae and EOM are normal. Right eye exhibits no discharge. Left eye exhibits no discharge. No scleral icterus.  Neck: Normal range of motion. Neck supple. No thyromegaly present.  No bruits thyromegaly or anterior cervical adenopathy  Cardiovascular: Normal rate, regular rhythm, normal heart sounds and intact distal pulses.  No murmur heard. The heart has a regular rate and rhythm at 60/min with no murmurs noted.  Pulmonary/Chest: Effort normal and breath sounds normal. She has no wheezes. She has no rales.  Clear anteriorly and posteriorly  Abdominal: Soft. Bowel sounds are normal. She exhibits no mass. There is no tenderness.  Abdominal obesity without masses tenderness organ enlargement or bruits.  Musculoskeletal: She exhibits tenderness. She exhibits no edema.  Range of motion of right knee is limited because of pain and there is bilateral joint line tenderness with palpation.  Lymphadenopathy:    She has no cervical adenopathy.   Neurological: She is alert and oriented to person, place, and time. She has normal reflexes. No cranial nerve deficit.  Reflexes are equal bilaterally  Skin: Skin is warm and dry. No rash noted.  Psychiatric: She has a normal mood and affect. Her behavior is normal. Judgment and thought content normal.  Mood affect and behavior are normal for this patient she remains alert.  Nursing note and vitals reviewed.  BP 115/72 (BP Location: Left Arm)   Pulse 62   Temp (!) 97.5 F (36.4 C) (Oral)   Ht 4' 11.5" (1.511 m)   Wt 150 lb (68 kg)   BMI 29.79 kg/m         Assessment & Plan:  1. Pure hypercholesterolemia -Continue with current treatment and as aggressive therapeutic lifestyle changes as possible pending results of lab work - CBC with Differential/Platelet - Lipid panel  2. Type 2 diabetes mellitus   with neurological manifestations, controlled (HCC) -Continue with current treatment and as aggressive therapeutic lifestyle changes as possible pending results of lab work - CBC with Differential/Platelet - Bayer DCA Hb A1c Waived  3. Essential hypertension -Blood pressure is good today she will continue with current treatment - BMP8+EGFR - CBC with Differential/Platelet - Hepatic function panel  4. Vitamin D deficiency -Continue with vitamin D replacement - CBC with Differential/Platelet - VITAMIN D 25 Hydroxy (Vit-D Deficiency, Fractures)  5. Type 2 diabetes mellitus with diabetic neuropathy, without long-term current use of insulin (HCC) -Try to be more active but always continue to be careful and do not put yourself at risk for falling.  Check feet daily. - Magnesium  6. Chronic pain of right knee -Follow-up with orthopedist as planned  7. Primary osteoarthritis of right knee -Follow-up with orthopedist as planned  8. Diabetic autonomic neuropathy associated with type 2 diabetes mellitus (HCC) -Increase gabapentin from 300 at bedtime to 300 at bedtime and 100 in the  morning.  Patient Instructions                       Medicare Annual Wellness Visit  [facility] and the medical providers at [facility] strive to bring you the best medical care.  In doing so we not only want to address your current medical conditions and concerns but also to detect new conditions early and prevent illness, disease and health-related problems.    Medicare offers a yearly Wellness Visit which allows our clinical staff to assess your need for preventative services including immunizations, lifestyle education, counseling to decrease risk of preventable diseases and screening for fall risk and other medical concerns.    This visit is provided free of charge (no copay) for all Medicare recipients. The clinical pharmacists at [facility] have begun to conduct these Wellness Visits which will also include a thorough review of all your medications.    As you primary medical provider recommend that you make an appointment for your Annual Wellness Visit if you have not done so already this year.  You may set up this appointment before you leave today or you may call back ([phone]) and schedule an appointment.  Please make sure when you call that you mention that you are scheduling your Annual Wellness Visit with the clinical pharmacist so that the appointment may be made for the proper length of time.     Continue current medications. Continue good therapeutic lifestyle changes which include good diet and exercise. Fall precautions discussed with patient. If an FOBT was given today- please return it to our front desk. If you are over 50 years old - you may need Prevnar 13 or the adult Pneumonia vaccine.  **Flu shots are available--- please call and schedule a FLU-CLINIC appointment**  After your visit with us today you will receive a survey in the mail or online from [employer] regarding your care with us. Please take a moment to fill  this out. Your feedback is very important to us as you can help us better understand your patient needs as well as improve your experience and satisfaction. WE CARE ABOUT YOU!!!   Follow-up with orthopedic surgeon as planned Continue to get yearly eye exams Check feet daily Increase gabapentin and take 100 mg in the morning in addition to 300 mg at night We will call with lab work results including a magnesium and B12 level.  Don W. Moore MD   

## 2018-05-20 ENCOUNTER — Telehealth: Payer: Self-pay | Admitting: Family Medicine

## 2018-05-20 LAB — HEPATIC FUNCTION PANEL
ALBUMIN: 4.2 g/dL (ref 3.5–4.7)
ALT: 22 IU/L (ref 0–32)
AST: 20 IU/L (ref 0–40)
Alkaline Phosphatase: 58 IU/L (ref 39–117)
Bilirubin Total: 0.6 mg/dL (ref 0.0–1.2)
Bilirubin, Direct: 0.15 mg/dL (ref 0.00–0.40)
Total Protein: 6.2 g/dL (ref 6.0–8.5)

## 2018-05-20 LAB — CBC WITH DIFFERENTIAL/PLATELET
Basophils Absolute: 0 10*3/uL (ref 0.0–0.2)
Basos: 0 %
EOS (ABSOLUTE): 0.3 10*3/uL (ref 0.0–0.4)
EOS: 5 %
HEMATOCRIT: 34.8 % (ref 34.0–46.6)
Hemoglobin: 11.9 g/dL (ref 11.1–15.9)
Immature Grans (Abs): 0 10*3/uL (ref 0.0–0.1)
Immature Granulocytes: 0 %
LYMPHS ABS: 2.2 10*3/uL (ref 0.7–3.1)
Lymphs: 41 %
MCH: 32 pg (ref 26.6–33.0)
MCHC: 34.2 g/dL (ref 31.5–35.7)
MCV: 94 fL (ref 79–97)
MONOS ABS: 0.4 10*3/uL (ref 0.1–0.9)
Monocytes: 8 %
NEUTROS ABS: 2.5 10*3/uL (ref 1.4–7.0)
Neutrophils: 46 %
Platelets: 255 10*3/uL (ref 150–450)
RBC: 3.72 x10E6/uL — ABNORMAL LOW (ref 3.77–5.28)
RDW: 13.2 % (ref 12.3–15.4)
WBC: 5.4 10*3/uL (ref 3.4–10.8)

## 2018-05-20 LAB — BMP8+EGFR
BUN / CREAT RATIO: 20 (ref 12–28)
BUN: 17 mg/dL (ref 8–27)
CO2: 27 mmol/L (ref 20–29)
CREATININE: 0.86 mg/dL (ref 0.57–1.00)
Calcium: 10.5 mg/dL — ABNORMAL HIGH (ref 8.7–10.3)
Chloride: 101 mmol/L (ref 96–106)
GFR calc Af Amer: 73 mL/min/{1.73_m2} (ref >59)
GFR, EST NON AFRICAN AMERICAN: 63 mL/min/{1.73_m2} (ref >59)
Glucose: 124 mg/dL — ABNORMAL HIGH (ref 65–99)
POTASSIUM: 4.1 mmol/L (ref 3.5–5.2)
SODIUM: 140 mmol/L (ref 134–144)

## 2018-05-20 LAB — LIPID PANEL
CHOL/HDL RATIO: 4 ratio (ref 0.0–4.4)
Cholesterol, Total: 160 mg/dL (ref 100–199)
HDL: 40 mg/dL (ref >39)
LDL Calculated: 81 mg/dL (ref 0–99)
Triglycerides: 193 mg/dL — ABNORMAL HIGH (ref 0–149)
VLDL Cholesterol Cal: 39 mg/dL (ref 5–40)

## 2018-05-20 LAB — VITAMIN D 25 HYDROXY (VIT D DEFICIENCY, FRACTURES): VIT D 25 HYDROXY: 55.6 ng/mL (ref 30.0–100.0)

## 2018-05-20 LAB — VITAMIN B12: Vitamin B-12: 1280 pg/mL — ABNORMAL HIGH (ref 232–1245)

## 2018-05-20 LAB — MAGNESIUM: MAGNESIUM: 1.7 mg/dL (ref 1.6–2.3)

## 2018-05-21 NOTE — Telephone Encounter (Signed)
Notified granddaughter of lab results and recommendations. She verbalized understanding.

## 2018-05-28 DIAGNOSIS — M1711 Unilateral primary osteoarthritis, right knee: Secondary | ICD-10-CM | POA: Diagnosis not present

## 2018-05-28 DIAGNOSIS — M25561 Pain in right knee: Secondary | ICD-10-CM | POA: Diagnosis not present

## 2018-06-08 ENCOUNTER — Encounter: Payer: Medicare Other | Admitting: Family

## 2018-07-05 ENCOUNTER — Encounter: Payer: Self-pay | Admitting: Family

## 2018-07-05 ENCOUNTER — Ambulatory Visit (INDEPENDENT_AMBULATORY_CARE_PROVIDER_SITE_OTHER): Payer: Medicare Other | Admitting: Family

## 2018-07-05 VITALS — BP 137/66 | HR 69 | Temp 98.7°F | Ht 59.5 in | Wt 153.2 lb

## 2018-07-05 DIAGNOSIS — Z1231 Encounter for screening mammogram for malignant neoplasm of breast: Secondary | ICD-10-CM | POA: Diagnosis not present

## 2018-07-05 DIAGNOSIS — Z01419 Encounter for gynecological examination (general) (routine) without abnormal findings: Secondary | ICD-10-CM

## 2018-07-05 DIAGNOSIS — Z23 Encounter for immunization: Secondary | ICD-10-CM

## 2018-07-05 LAB — HM MAMMOGRAPHY

## 2018-07-05 NOTE — Addendum Note (Signed)
Addended by: Shelbie Ammons on: 07/05/2018 03:20 PM   Modules accepted: Orders

## 2018-07-05 NOTE — Patient Instructions (Signed)
Pelvic Exam A pelvic exam is an exam of a woman's outer and inner genitals and reproductive organs. Pelvic exams are done to screen for health problems and to help prevent health problems from developing. You should start having pelvic exams when you turn 82 years old, unless your health care provider recommends having a pelvic exam earlier. Talk with your health care provider about how often you should have a pelvic exam. During your pelvic exam, your health care provider may ask you questions about your health, your family's health, your menstrual periods, immunizations, and your sexual activity. The information shared between you and your health care provider will not be shared with anyone else. What are some reasons to have a pelvic exam? There are many possible reasons for having a pelvic exam. A pelvic exam may be recommended to check for:  Normal development and function of the reproductive organs.  Cancer of the ovaries, uterus, or vagina.  Signs of sexually transmitted infections (STIs) or other types of infections.  Pregnancy. If you are pregnant, a pelvic exam can also help determine how far along you are in your pregnancy.  Widening (dilation) of the cervix during labor.  Injury (trauma) to the reproductive organs.  A pelvic exam may be recommended to help explain or diagnose:  Changes in your body that may be signs of cancer in the reproductive system.  Inability to get pregnant (infertility).  Vaginal itching or burning.  Abnormal vaginal discharge or bleeding.  Problems with sexual function.  Problems with urination, such as: ? Painful urination. ? Frequent urinary tract infections. ? Inability to control when you urinate (urinary incontinence).  Problems with menstrual periods, such as: ? Severe cramping. ? Absence of any menstrual flow in a female by the age of 38 years (primary amenorrhea). ? Stopping of menstrual flow for 3-6 months at a time (secondary  amenorrhea).  Depending on the purpose of your pelvic exam, your health care provider may perform:  A Pap test. This is sometimes called a Pap smear. It is a screening test that is used to check for signs of cancer of the vagina, cervix, and uterus. The test can also identify the presence of infection or precancerous changes.  A cervical biopsy. This is the removal of a small sample of tissue from the cervix. The cervix is the lowest part of the womb (uterus), which opens into the vagina (birth canal). The tissue will be checked under a microscope.  Other diagnostic tests that involve taking samples of tissue or fluid (cultures).  If you have tests done, it is your responsibility to get your test results. Ask your health care provider or the department performing the test when your results will be ready. How is a pelvic exam performed? Usually, a physical exam is done first. This may include:  An exam of your breasts. Your health care provider may feel your breasts to check for abnormalities.  An exam of your abdomen. Your health care provider may press on your abdomen to check for abnormalities.  Pelvic exams may vary among health care providers and hospitals. The following things are usually done during a pelvic exam:  You will remove your clothes from the waist down. You will put on a gown or a wrap to cover yourself while you get ready for the exam.  You will lie on your back on a special table. Your feet will be placed into foot rests (stirrups) so that your legs are wide apart and your knees  are bent. A drape will be placed over your abdomen and your legs.  Your health care provider will examine your outer genitals to check for anything unusual. This includes your clitoris, urethra, vaginal opening, labia, and the skin between your vagina and your anus (perineum).  Your health care provider will examine your inner genitals. To do this, a lubricated instrument (speculum) will be  inserted into your vagina. The speculum will be widened to open the walls of your vagina. ? Your health care provider will examine your vagina and cervix. ? A Pap test, cervical biopsy, or cultures may be done as needed. ? After the internal exam is done, the speculum will be removed.  Your health care provider will put on germ-free (sterile) latex gloves and insert two fingers into your vagina to gently press against various organs. ? Your health care provider may use his or her other hand to gently press on your lower abdomen while doing this.  A pelvic exam is usually painless, although it can cause mild discomfort. If you experience pain at any time during your pelvic exam, tell your health care provider right away. When should I seek medical care? Seek medical care after your pelvic exam if:  You develop new symptoms.  You experience pain or discomfort from anything that was done during your pelvic exam.  This information is not intended to replace advice given to you by your health care provider. Make sure you discuss any questions you have with your health care provider. Document Released: 12/13/2002 Document Revised: 02/06/2016 Document Reviewed: 06/26/2015 Elsevier Interactive Patient Education  2018 [employer].

## 2018-07-05 NOTE — Progress Notes (Signed)
   Subjective:    Patient ID: Theresa Watson, female    DOB: [birth year], 84 y.o.   MRN: [id number]  Chief Complaint  Patient presents with  . Gynecologic Exam   Pt presents to the office today for pelvic exam. She is followed by Dr. Laurance Flatten every 4 months for chronic follow up.  Gynecologic Exam  The patient's pertinent negatives include no genital itching, genital odor, vaginal bleeding or vaginal discharge. The patient is experiencing no pain. Pertinent negatives include no constipation, diarrhea, dysuria, hematuria or urgency.      Review of Systems  Gastrointestinal: Negative for constipation and diarrhea.  Genitourinary: Negative for dysuria, hematuria, urgency and vaginal discharge.  All other systems reviewed and are negative.      Objective:   Physical Exam  Constitutional: She is oriented to person, place, and time. She appears well-developed and well-nourished. No distress.  HENT:  Head: Normocephalic and atraumatic.  Right Ear: External ear normal.  Left Ear: External ear normal.  Mouth/Throat: Oropharynx is clear and moist.  Eyes: Pupils are equal, round, and reactive to light.  Neck: Normal range of motion. Neck supple. No thyromegaly present.  Cardiovascular: Normal rate, regular rhythm, normal heart sounds and intact distal pulses.  No murmur heard. Pulmonary/Chest: Effort normal and breath sounds normal. No respiratory distress. She has no wheezes. Right breast exhibits no inverted nipple, no mass, no nipple discharge, no skin change and no tenderness. Left breast exhibits no inverted nipple, no mass, no nipple discharge, no skin change and no tenderness.  Abdominal: Soft. Bowel sounds are normal. She exhibits no distension. There is no tenderness.  Genitourinary: Vagina normal.  Genitourinary Comments: Bimanual exam- no adnexal masses or tenderness, ovaries nonpalpable   Cervix not present- No discharge   Musculoskeletal: Normal range of motion. She exhibits no  edema or tenderness.  Neurological: She is oriented to person, place, and time. She has normal reflexes. No cranial nerve deficit.  Skin: Skin is warm and dry.  Psychiatric: She has a normal mood and affect. Her behavior is normal. Judgment and thought content normal.  Vitals reviewed.     BP 137/66   Pulse 69   Temp 98.7 F (37.1 C) (Oral)   Ht 4' 11.5" (1.511 m)   Wt 153 lb 3.2 oz (69.5 kg)   BMI 30.42 kg/m      Assessment & Plan:  Theresa Watson comes in today with chief complaint of Gynecologic Exam   Diagnosis and orders addressed:  1. Encounter for immunization - Flu vaccine HIGH DOSE PF  2. Encounter for gynecological examination without abnormal finding Discussed this could be her last pap   Labs pending Health Maintenance reviewed Diet and exercise encouraged  Follow up plan: Keep chronic follow up with PCP   Evelina Dun, FNP

## 2018-07-07 LAB — PAP IG (IMAGE GUIDED): PAP SMEAR COMMENT: 0

## 2018-07-19 ENCOUNTER — Other Ambulatory Visit: Payer: Self-pay | Admitting: Family Medicine

## 2018-07-29 DIAGNOSIS — M25562 Pain in left knee: Secondary | ICD-10-CM | POA: Diagnosis not present

## 2018-10-14 ENCOUNTER — Encounter: Payer: Self-pay | Admitting: Family Medicine

## 2018-10-14 ENCOUNTER — Ambulatory Visit (INDEPENDENT_AMBULATORY_CARE_PROVIDER_SITE_OTHER): Payer: Medicare Other | Admitting: Family Medicine

## 2018-10-14 ENCOUNTER — Ambulatory Visit (INDEPENDENT_AMBULATORY_CARE_PROVIDER_SITE_OTHER): Payer: Medicare Other

## 2018-10-14 VITALS — BP 137/70 | HR 59 | Temp 96.3°F | Ht 59.5 in | Wt 155.0 lb

## 2018-10-14 DIAGNOSIS — E559 Vitamin D deficiency, unspecified: Secondary | ICD-10-CM

## 2018-10-14 DIAGNOSIS — E1149 Type 2 diabetes mellitus with other diabetic neurological complication: Secondary | ICD-10-CM | POA: Diagnosis not present

## 2018-10-14 DIAGNOSIS — I10 Essential (primary) hypertension: Secondary | ICD-10-CM

## 2018-10-14 DIAGNOSIS — E7800 Pure hypercholesterolemia, unspecified: Secondary | ICD-10-CM

## 2018-10-14 DIAGNOSIS — E1143 Type 2 diabetes mellitus with diabetic autonomic (poly)neuropathy: Secondary | ICD-10-CM

## 2018-10-14 DIAGNOSIS — M1711 Unilateral primary osteoarthritis, right knee: Secondary | ICD-10-CM | POA: Diagnosis not present

## 2018-10-14 DIAGNOSIS — E785 Hyperlipidemia, unspecified: Secondary | ICD-10-CM

## 2018-10-14 DIAGNOSIS — E1169 Type 2 diabetes mellitus with other specified complication: Secondary | ICD-10-CM

## 2018-10-14 DIAGNOSIS — E0843 Diabetes mellitus due to underlying condition with diabetic autonomic (poly)neuropathy: Secondary | ICD-10-CM

## 2018-10-14 LAB — BAYER DCA HB A1C WAIVED: HB A1C (BAYER DCA - WAIVED): 7 % — ABNORMAL HIGH (ref <7.0)

## 2018-10-14 NOTE — Patient Instructions (Addendum)
Medicare Annual Wellness Visit  [facility] and the medical providers at [facility] strive to bring you the best medical care.  In doing so we not only want to address your current medical conditions and concerns but also to detect new conditions early and prevent illness, disease and health-related problems.    Medicare offers a yearly Wellness Visit which allows our clinical staff to assess your need for preventative services including immunizations, lifestyle education, counseling to decrease risk of preventable diseases and screening for fall risk and other medical concerns.    This visit is provided free of charge (no copay) for all Medicare recipients. The clinical pharmacists at [facility] have begun to conduct these Wellness Visits which will also include a thorough review of all your medications.    As you primary medical provider recommend that you make an appointment for your Annual Wellness Visit if you have not done so already this year.  You may set up this appointment before you leave today or you may call back ([phone]) and schedule an appointment.  Please make sure when you call that you mention that you are scheduling your Annual Wellness Visit with the clinical pharmacist so that the appointment may be made for the proper length of time.     Continue current medications. Continue good therapeutic lifestyle changes which include good diet and exercise. Fall precautions discussed with patient. If an FOBT was given today- please return it to our front desk. If you are over 2 years old - you may need Prevnar 73 or the adult Pneumonia vaccine.  **Flu shots are available--- please call and schedule a FLU-CLINIC appointment**  After your visit with [place] today you will receive a survey in the mail or online from [employer] regarding your care with [place]. Please take a moment to fill this out. Your feedback is very  important to [place] as you can help [place] better understand your patient needs as well as improve your experience and satisfaction. WE CARE ABOUT YOU!!!   Continue to monitor feet closely Discuss with orthopedist the need for knee supports and possible physical therapy and let us know about that. Check blood sugars regularly at home See podiatrist as needed Continue to be careful and not put herself at risk for falling Not forget to get eyes examined in the spring and make sure that a copy of the report is sent to [place]

## 2018-10-14 NOTE — Progress Notes (Signed)
Subjective:    Patient ID: Theresa Watson, female    DOB: 06-13-1933, 85 y.o.   MRN: [id number]  HPI Pt here for follow up and management of chronic medical problems which includes diabetes and hypertension. She is taking medication regularly.  The patient is doing well overall and comes to the visit today with her daughter Theresa Watson.  She has been seeing the orthopedist for her left knee problems.  She will get a chest x-ray today and lab work today and also a urine for microalbumin.  The patient appears to be doing well with a positive attitude.  She does bring in blood sugars for review and I will be scanned into the record.  Just looking at these it appears that throughout the day the readings are running in the 1 40-1 50 range with an occasional outlier up into the 160 range and down into the 120s.  On a depression scale she had a score of 4.  The patient comes to the visit today with her granddaughter, Theresa Watson.  She is due to get an eye exam in March.  Her knee is better but she will follow-up with orthopedist in [facility] if she has more trouble.  The granddaughter says that she speaks when she probably should not speak it time and we did have a conversation about that and I did tell the patient that she should respect her family's suggestions when she over speaks and says things that she should not say.  Today the patient denies any chest pain pressure tightness or shortness of breath.  She does have occasional heartburn and she is currently taking omeprazole for this.  We did discuss the possibility of taking some Pepcid AC and at least trying this twice a day before breakfast and supper for a week to see if it controlled her heartburn and if it did she can stay on the Pepcid [facility] and stop the omeprazole.  The granddaughter will see that she tries this.  She denies any blood in the stool but does have dark bowel movements because of taking iron.  No change in bowel habits.  She is passing her water  well.   Patient Active Problem List   Diagnosis Date Noted  . Diabetes mellitus due to underlying condition with diabetic autonomic neuropathy, without long-term current use of insulin ([place]) 09/18/2015  . Annual physical exam 02/26/2015  . Type 2 diabetes mellitus with neurological manifestations, controlled ([place]) 08/29/2013  . Hypertension 11/13/2012  . Cataracts, bilateral 11/13/2012  . Symptomatic menopausal or female climacteric states   . Hyperlipidemia associated with type 2 diabetes mellitus ([place])   . Esophagitis   . 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 Obesity, mild   . IBS (irritable bowel syndrome)   . Kidney stone   . Lumbosacral spondylosis without myelopathy   . Pain in joint, shoulder region   . Colon polyp   . Diverticulosis    Outpatient Encounter Medications as of 10/14/2018  Medication Sig  . ACCU-CHEK AVIVA PLUS test strip TEST TWO TIMES DAILY AS  DIRECTED  . aspirin 81 MG EC tablet Take 81 mg by mouth daily.    . Calcium Carb-Cholecalciferol (CALCIUM 600 + D PO) Take by mouth.  . Cholecalciferol (VITAMIN D3) 2000 UNITS TABS Take 1 tablet by mouth daily.    . ferrous sulfate 325 (65 FE) MG EC tablet Take 325 mg by mouth daily with breakfast.    . gabapentin (NEURONTIN)  100 MG capsule Take 1 cap PO in the AM and 3 caps po QHS  . Garlic 697 MG TABS Take 1 tablet by mouth 2 (two) times daily.   Marland Kitchen geriatric multivitamins-minerals (ELDERTONIC/GEVRABON) ELIX Take 15 mLs by mouth daily.    . Omega-3 Fatty Acids (FISH OIL) 1000 MG CAPS Take 2 capsules by mouth daily.   Marland Kitchen omeprazole (PRILOSEC) 20 MG capsule Take 20 mg by mouth daily.    Marland Kitchen OVER THE COUNTER MEDICATION Vit C, vit E, green tea pill, garlic, vit X48  . potassium chloride SA (K-DUR,KLOR-CON) 20 MEQ tablet TAKE 1 TABLET BY MOUTH  DAILY  . pravastatin (PRAVACHOL) 80 MG tablet TAKE 1 TABLET BY MOUTH  DAILY  . triamterene-hydrochlorothiazide (MAXZIDE-25) 37.5-25 MG tablet TAKE 1 TABLET  BY MOUTH  DAILY   No facility-administered encounter medications on file as of 10/14/2018.       Review of Systems  Constitutional: Negative.   HENT: Negative.   Eyes: Negative.   Respiratory: Negative.   Cardiovascular: Negative.   Gastrointestinal: Negative.   Endocrine: Negative.   Genitourinary: Negative.   Musculoskeletal: Negative.   Skin: Negative.   Allergic/Immunologic: Negative.   Neurological: Negative.   Hematological: Negative.   Psychiatric/Behavioral: Negative.        Objective:   Physical Exam Vitals signs and nursing note reviewed.  Constitutional:      Appearance: Normal appearance. She is well-developed. She is obese. She is not ill-appearing.     Comments: The patient is pleasant alert and in good spirits  HENT:     Head: Normocephalic and atraumatic.     Right Ear: Tympanic membrane, ear canal and external ear normal. There is no impacted cerumen.     Left Ear: Tympanic membrane, ear canal and external ear normal. There is no impacted cerumen.     Nose: Nose normal. No congestion.     Mouth/Throat:     Mouth: Mucous membranes are moist.     Pharynx: Oropharynx is clear. No oropharyngeal exudate.  Eyes:     General: No scleral icterus.       Right eye: No discharge.        Left eye: No discharge.     Extraocular Movements: Extraocular movements intact.     Conjunctiva/sclera: Conjunctivae normal.     Pupils: Pupils are equal, round, and reactive to light.  Neck:     Musculoskeletal: Normal range of motion and neck supple. No neck rigidity or muscular tenderness.     Thyroid: No thyromegaly.     Vascular: No carotid bruit or JVD.     Comments: No bruits thyromegaly or anterior cervical adenopathy Cardiovascular:     Rate and Rhythm: Normal rate and regular rhythm.     Pulses: Normal pulses.     Heart sounds: Normal heart sounds. No murmur.     Comments: The heart is regular at 60/min Pulmonary:     Effort: Pulmonary effort is normal. No  respiratory distress.     Breath sounds: Normal breath sounds. No wheezing or rales.     Comments: Clear anteriorly and posteriorly Abdominal:     General: Bowel sounds are normal.     Palpations: Abdomen is soft. There is no mass.     Tenderness: There is no abdominal tenderness. There is no guarding or rebound.     Comments: No liver or spleen enlargement tenderness or inguinal adenopathy  Musculoskeletal: Normal range of motion.        General:  No tenderness.     Right lower leg: No edema.     Left lower leg: No edema.  Lymphadenopathy:     Cervical: No cervical adenopathy.  Skin:    General: Skin is warm and dry.     Findings: No rash.  Neurological:     Mental Status: She is alert and oriented to person, place, and time. Mental status is at baseline.     Cranial Nerves: No cranial nerve deficit.     Deep Tendon Reflexes: Reflexes are normal and symmetric.  Psychiatric:        Mood and Affect: Mood normal.        Behavior: Behavior normal.        Thought Content: Thought content normal.        Judgment: Judgment normal.     Comments: Mood affect and behavior are are normal for this patient    BP 137/70 (BP Location: Left Arm)   Pulse (!) 59   Temp (!) 96.3 F (35.7 C) (Oral)   Ht 4' 11.5" (1.511 m)   Wt 155 lb (70.3 kg)   BMI 30.78 kg/m         Assessment & Plan:  1. Pure hypercholesterolemia -Continue with current treatment and with as aggressive therapeutic lifestyle changes as possible pending results of lab work - [employer]; Future - Lipid panel - CBC with Differential/Platelet  2. Type 2 diabetes mellitus with neurological manifestations, controlled ([place]) -Continue with current treatment, check feet regularly and follow diet as closely as possible - BMP8+EGFR - CBC with Differential/Platelet - Microalbumin / creatinine urine ratio - Bayer DCA Hb A1c Waived  3. Essential hypertension -Blood pressure is good today and she will continue with  current treatment - DG Chest 2 View; Future - CBC with Differential/Platelet - Hepatic function panel  4. Vitamin D deficiency -Continue with vitamin D replacement pending results of lab work - CBC with Differential/Platelet - VITAMIN D 25 Hydroxy (Vit-D Deficiency, Fractures)  5. Primary osteoarthritis of right knee -Follow-up with orthopedist as needed and check with orthopedist regarding knee supports that she is currently wearing and whether she should continue to do this or not  6. Diabetic autonomic neuropathy associated with type 2 diabetes mellitus ([place]) -Keep blood sugar under the best control possible with diet and exercise  7. Hyperlipidemia associated with type 2 diabetes mellitus ([place]) -Continue current treatment pending results of lab work  8. Diabetes mellitus due to underlying condition with diabetic autonomic neuropathy, without long-term current use of insulin (HCC) -Check feet regularly  Patient Instructions                       Medicare Annual Wellness Visit  [place] and the medical providers at [facility] strive to bring you the best medical care.  In doing so we not only want to address your current medical conditions and concerns but also to detect new conditions early and prevent illness, disease and health-related problems.    Medicare offers a yearly Wellness Visit which allows our clinical staff to assess your need for preventative services including immunizations, lifestyle education, counseling to decrease risk of preventable diseases and screening for fall risk and other medical concerns.    This visit is provided free of charge (no copay) for all Medicare recipients. The clinical pharmacists at [facility] have begun to conduct these Wellness Visits which will also include a thorough review of  all your medications.    As you primary medical provider recommend that you make an appointment for your Annual  Wellness Visit if you have not done so already this year.  You may set up this appointment before you leave today or you may call back ([phone]) and schedule an appointment.  Please make sure when you call that you mention that you are scheduling your Annual Wellness Visit with the clinical pharmacist so that the appointment may be made for the proper length of time.     Continue current medications. Continue good therapeutic lifestyle changes which include good diet and exercise. Fall precautions discussed with patient. If an FOBT was given today- please return it to our front desk. If you are over 52 years old - you may need Prevnar 13 or the adult Pneumonia vaccine.  **Flu shots are available--- please call and schedule a FLU-CLINIC appointment**  After your visit with [place] today you will receive a survey in the mail or online from [employer] regarding your care with [place]. Please take a moment to fill this out. Your feedback is very important to [place] as you can help [place] better understand your patient needs as well as improve your experience and satisfaction. WE CARE ABOUT YOU!!!   Continue to monitor feet closely Discuss with orthopedist the need for knee supports and possible physical therapy and let us know about that. Check blood sugars regularly at home See podiatrist as needed Continue to be careful and not put herself at risk for falling Not forget to get eyes examined in the spring and make sure that a copy of the report is sent to [place]  Arrie Senate MD

## 2018-10-15 ENCOUNTER — Other Ambulatory Visit: Payer: Self-pay | Admitting: *Deleted

## 2018-10-15 LAB — CBC WITH DIFFERENTIAL/PLATELET
BASOS ABS: 0 10*3/uL (ref 0.0–0.2)
Basos: 1 %
EOS (ABSOLUTE): 0.2 10*3/uL (ref 0.0–0.4)
Eos: 4 %
Hematocrit: 35.2 % (ref 34.0–46.6)
Hemoglobin: 11.7 g/dL (ref 11.1–15.9)
IMMATURE GRANS (ABS): 0 10*3/uL (ref 0.0–0.1)
Immature Granulocytes: 1 %
LYMPHS ABS: 2.2 10*3/uL (ref 0.7–3.1)
LYMPHS: 35 %
MCH: 31.5 pg (ref 26.6–33.0)
MCHC: 33.2 g/dL (ref 31.5–35.7)
MCV: 95 fL (ref 79–97)
Monocytes Absolute: 0.6 10*3/uL (ref 0.1–0.9)
Monocytes: 10 %
NEUTROS ABS: 3.1 10*3/uL (ref 1.4–7.0)
Neutrophils: 49 %
PLATELETS: 313 10*3/uL (ref 150–450)
RBC: 3.72 x10E6/uL — ABNORMAL LOW (ref 3.77–5.28)
RDW: 11.5 % — ABNORMAL LOW (ref 11.7–15.4)
WBC: 6.3 10*3/uL (ref 3.4–10.8)

## 2018-10-15 LAB — BMP8+EGFR
BUN/Creatinine Ratio: 18 (ref 12–28)
BUN: 16 mg/dL (ref 8–27)
CALCIUM: 10.4 mg/dL — AB (ref 8.7–10.3)
CHLORIDE: 97 mmol/L (ref 96–106)
CO2: 23 mmol/L (ref 20–29)
Creatinine, Ser: 0.9 mg/dL (ref 0.57–1.00)
GFR calc non Af Amer: 59 mL/min/{1.73_m2} — ABNORMAL LOW (ref >59)
GFR, EST AFRICAN AMERICAN: 68 mL/min/{1.73_m2} (ref >59)
GLUCOSE: 130 mg/dL — AB (ref 65–99)
POTASSIUM: 4.1 mmol/L (ref 3.5–5.2)
Sodium: 138 mmol/L (ref 134–144)

## 2018-10-15 LAB — LIPID PANEL
CHOLESTEROL TOTAL: 176 mg/dL (ref 100–199)
Chol/HDL Ratio: 3.9 ratio (ref 0.0–4.4)
HDL: 45 mg/dL (ref >39)
LDL Calculated: 95 mg/dL (ref 0–99)
Triglycerides: 180 mg/dL — ABNORMAL HIGH (ref 0–149)
VLDL Cholesterol Cal: 36 mg/dL (ref 5–40)

## 2018-10-15 LAB — MICROALBUMIN / CREATININE URINE RATIO
Creatinine, Urine: 37.2 mg/dL
Microalb/Creat Ratio: 64 mg/g creat — ABNORMAL HIGH (ref 0.0–30.0)
Microalbumin, Urine: 23.8 ug/mL

## 2018-10-15 LAB — HEPATIC FUNCTION PANEL
ALT: 19 IU/L (ref 0–32)
AST: 15 IU/L (ref 0–40)
Albumin: 4.5 g/dL (ref 3.5–4.7)
Alkaline Phosphatase: 69 IU/L (ref 39–117)
BILIRUBIN, DIRECT: 0.13 mg/dL (ref 0.00–0.40)
Bilirubin Total: 0.5 mg/dL (ref 0.0–1.2)
Total Protein: 6.3 g/dL (ref 6.0–8.5)

## 2018-10-15 LAB — VITAMIN D 25 HYDROXY (VIT D DEFICIENCY, FRACTURES): Vit D, 25-Hydroxy: 46.1 ng/mL (ref 30.0–100.0)

## 2018-10-15 MED ORDER — ICOSAPENT ETHYL 1 G PO CAPS: 2.0000 g | ORAL_CAPSULE | Freq: Two times a day (BID) | ORAL | 3 refills | Status: DC

## 2018-10-15 NOTE — Progress Notes (Signed)
How often should pt take Vascepa? Once daily

## 2018-10-15 NOTE — Progress Notes (Signed)
2 g twice daily is the recommended dose to achieve the lower events of heart attack and stroke

## 2018-11-23 ENCOUNTER — Other Ambulatory Visit: Payer: Self-pay | Admitting: Family Medicine

## 2018-12-24 DIAGNOSIS — H2513 Age-related nuclear cataract, bilateral: Secondary | ICD-10-CM | POA: Diagnosis not present

## 2018-12-24 DIAGNOSIS — H40033 Anatomical narrow angle, bilateral: Secondary | ICD-10-CM | POA: Diagnosis not present

## 2019-02-18 ENCOUNTER — Ambulatory Visit (INDEPENDENT_AMBULATORY_CARE_PROVIDER_SITE_OTHER): Payer: Medicare Other | Admitting: Family Medicine

## 2019-02-18 ENCOUNTER — Encounter: Payer: Self-pay | Admitting: Family Medicine

## 2019-02-18 ENCOUNTER — Encounter: Payer: Medicare Other | Admitting: *Deleted

## 2019-02-18 ENCOUNTER — Other Ambulatory Visit: Payer: Self-pay

## 2019-02-18 DIAGNOSIS — M1711 Unilateral primary osteoarthritis, right knee: Secondary | ICD-10-CM | POA: Diagnosis not present

## 2019-02-18 DIAGNOSIS — E7800 Pure hypercholesterolemia, unspecified: Secondary | ICD-10-CM

## 2019-02-18 DIAGNOSIS — E1140 Type 2 diabetes mellitus with diabetic neuropathy, unspecified: Secondary | ICD-10-CM

## 2019-02-18 DIAGNOSIS — E0843 Diabetes mellitus due to underlying condition with diabetic autonomic (poly)neuropathy: Secondary | ICD-10-CM

## 2019-02-18 DIAGNOSIS — E1143 Type 2 diabetes mellitus with diabetic autonomic (poly)neuropathy: Secondary | ICD-10-CM

## 2019-02-18 DIAGNOSIS — E1169 Type 2 diabetes mellitus with other specified complication: Secondary | ICD-10-CM | POA: Diagnosis not present

## 2019-02-18 DIAGNOSIS — I10 Essential (primary) hypertension: Secondary | ICD-10-CM | POA: Diagnosis not present

## 2019-02-18 DIAGNOSIS — E1149 Type 2 diabetes mellitus with other diabetic neurological complication: Secondary | ICD-10-CM

## 2019-02-18 DIAGNOSIS — E785 Hyperlipidemia, unspecified: Secondary | ICD-10-CM

## 2019-02-18 DIAGNOSIS — E559 Vitamin D deficiency, unspecified: Secondary | ICD-10-CM | POA: Diagnosis not present

## 2019-02-18 NOTE — Addendum Note (Signed)
Addended by: Zannie Cove on: 02/18/2019 01:42 PM   Modules accepted: Orders

## 2019-02-18 NOTE — Progress Notes (Signed)
Virtual Visit Via telephone Note I connected with@ on 02/18/19 by telephone and verified that I am speaking with the correct person or authorized healthcare agent using two identifiers. Theresa Watson is currently located at home and there are no unauthorized people in close proximity. I completed this visit while in a private location in my home .  The patient's daughter, Vaughan Basta was with the patient in her home.  This visit type was conducted due to national recommendations for restrictions regarding the COVID-19 Pandemic (e.g. social distancing).  This format is felt to be most appropriate for this patient at this time.  All issues noted in this document were discussed and addressed.  No physical exam was performed.    I discussed the limitations, risks, security and privacy concerns of performing an evaluation and management service by telephone and the availability of in person appointments. I also discussed with the patient that there may be a patient responsible charge related to this service. The patient expressed understanding and agreed to proceed.   Date:  02/18/2019    ID:  Theresa Watson      09-24-1933        [id number]   Patient Care Team Patient Care Team: [name], MD as PCP - General (Family Medicine) Clarene Essex, MD as Consulting Physician (Gastroenterology) Allyn Kenner, MD (Dermatology) Harlen Labs, MD as Referring Physician [facility])  Reason for Visit: Primary Care Follow-up     History of Present Illness & Review of Systems:     Theresa Watson is a 85 y.o. year old female primary care patient that presents today for a telehealth visit.  The patient is doing well overall with no specific complaints.  She denies any chest pain pressure tightness or shortness of breath.  She denies any problems with swallowing heartburn indigestion nausea vomiting diarrhea blood in the stool black tarry bowel movements other than coming from iron intake or change in  bowel habits.  She is passing her water well.  She is trying to drink plenty of water and fluids.  She did have an eye exam in February and she gets this yearly at [facility].  The patient's daughter and granddaughter take good care of her.  She does not need any refills currently.  She apparently is not taking Vascepa but an over-the-counter fish oil from [facility].  Review of systems as stated, otherwise negative.  The patient does not have symptoms concerning for COVID-19 infection (fever, chills, cough, or new shortness of breath).      Current Medications (Verified) Allergies as of 02/18/2019      Reactions   Levaquin [levofloxacin] Swelling   tongue   Cephalexin    Penicillins    Lisinopril Cough      Medication List       Accurate as of Feb 18, 2019  7:52 AM. If you have any questions, ask your nurse or doctor.        Accu-Chek Aviva Plus test strip Generic drug:  glucose blood TEST TWO TIMES DAILY AS  DIRECTED   aspirin 81 MG EC tablet Take 81 mg by mouth daily.   CALCIUM 600 + D PO Take by mouth.   ferrous sulfate 325 (65 FE) MG EC tablet Take 325 mg by mouth daily with breakfast.   Fish Oil 1000 MG Caps Take 2 capsules by mouth daily.   gabapentin 100 MG capsule Commonly known as:  NEURONTIN Take 1 cap PO  in the AM and 3 caps po QHS   Garlic 809 MG Tabs Take 1 tablet by mouth 2 (two) times daily.   geriatric multivitamins-minerals Elix Take 15 mLs by mouth daily.   Icosapent Ethyl 1 g Caps Take 2 capsules (2 g total) by mouth 2 (two) times daily.   omeprazole 20 MG capsule Commonly known as:  PRILOSEC Take 20 mg by mouth daily.   OVER THE COUNTER MEDICATION Vit C, vit E, green tea pill, garlic, vit X83   potassium chloride SA 20 MEQ tablet Commonly known as:  K-DUR TAKE 1 TABLET BY MOUTH  DAILY   pravastatin 80 MG tablet Commonly known as:  PRAVACHOL TAKE 1 TABLET BY MOUTH  DAILY   triamterene-hydrochlorothiazide 37.5-25 MG tablet Commonly known  as:  MAXZIDE-25 TAKE 1 TABLET BY MOUTH  DAILY   Vitamin D3 50 MCG (2000 UT) Tabs Take 1 tablet by mouth daily.           Allergies (Verified)    Levaquin [levofloxacin]; Cephalexin; Penicillins; and Lisinopril  Past Medical History Past Medical History:  Diagnosis Date  . Colon polyp   . Diabetes mellitus without complication ([place])   . Diverticulosis   . Esophagitis   . IBS (irritable bowel syndrome)   . Kidney stone    hx of 1 kidney stone  . Lumbosacral spondylosis without myelopathy   . Obesity, mild   . Other and unspecified hyperlipidemia   . Pain in joint, shoulder region   . 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 Symptomatic menopausal or female climacteric states      Past Surgical History:  Procedure Laterality Date  . CARPAL TUNNEL RELEASE    . CHOLECYSTECTOMY    . EYE SURGERY Bilateral    cataracts  . lumbar back surgery    . VAGINAL HYSTERECTOMY     partial    Social History   Socioeconomic History  . Marital status: Divorced    Spouse name: Not on file  . Number of children: 5  . Years of education: GED  . Highest education level: GED or equivalent  Occupational History  . Occupation: retired  Scientific laboratory technician  . Financial resource strain: Not hard at all  . Food insecurity:    Worry: Never true    Inability: Never true  . Transportation needs:    Medical: No    Non-medical: No  Tobacco Use  . Smoking status: Former Smoker    Packs/day: 0.50    Types: Cigarettes    Start date: 10/06/1953    Last attempt to quit: 02/04/2012    Years since quitting: 7.0  . Smokeless tobacco: Never Used  Substance and Sexual Activity  . Alcohol use: No  . Drug use: No  . Sexual activity: Not Currently  Lifestyle  . Physical activity:    Days per week: Not on file    Minutes per session: Not on file  . Stress: Not on file  Relationships  . Social connections:    Talks on phone: More than three times a week    Gets together:  More than three times a week    Attends religious service: More than 4 times per year    Active member of club or organization: Yes    Attends meetings of clubs or organizations: More than 4 times per year    Relationship status: Divorced  Other Topics Concern  . Not on file  Social History Narrative  .  Not on file     Family History  Problem Relation Age of Onset  . Cancer Mother        brain  . Brain cancer Mother   . GI Bleed Father   . Arthritis Sister        rheumatoid  . Lymphoma Sister   . Pancreatic cancer Brother   . Diabetes Son   . Arthritis Son        back  . Cancer Son        prostate  . Cancer Maternal Grandfather   . Suicidality Paternal Grandfather   . Arthritis Son        back  . Hyperlipidemia Son   . Colitis Son       Labs/Other Tests and Data Reviewed:    Wt Readings from Last 3 Encounters:  10/14/18 155 lb (70.3 kg)  07/05/18 153 lb 3.2 oz (69.5 kg)  05/19/18 150 lb (68 kg)   Temp Readings from Last 3 Encounters:  10/14/18 (!) 96.3 F (35.7 C) (Oral)  07/05/18 98.7 F (37.1 C) (Oral)  05/19/18 (!) 97.5 F (36.4 C) (Oral)   BP Readings from Last 3 Encounters:  10/14/18 137/70  07/05/18 137/66  05/19/18 115/72   Pulse Readings from Last 3 Encounters:  10/14/18 (!) 59  07/05/18 69  05/19/18 62     Lab Results  Component Value Date   HGBA1C 7.0 (H) 10/14/2018   HGBA1C 6.3 05/19/2018   HGBA1C 6.3 01/11/2018   Lab Results  Component Value Date   MICROALBUR 50 08/17/2014   LDLCALC 95 10/14/2018   CREATININE 0.90 10/14/2018       Chemistry      Component Value Date/Time   NA 138 10/14/2018 1135   K 4.1 10/14/2018 1135   CL 97 10/14/2018 1135   CO2 23 10/14/2018 1135   BUN 16 10/14/2018 1135   CREATININE 0.90 10/14/2018 1135   CREATININE 0.81 03/16/2013 0902      Component Value Date/Time   CALCIUM 10.4 (H) 10/14/2018 1135   ALKPHOS 69 10/14/2018 1135   AST 15 10/14/2018 1135   ALT 19 10/14/2018 1135   BILITOT 0.5  10/14/2018 1135         OBSERVATIONS/ OBJECTIVE:     The patient was alert and provided me with objective information from home.  She says her weight is about 158 and up a little bit because of her lack of activity.  She does not have any blood pressure readings.  She said her feet are clear of infection or sores.  She keeps them checked regularly.  Her fasting blood sugars run between 126 and 136.  Blood sugar during the day may be as high as the 160 range but usually no higher than that during the day.  Physical exam deferred due to nature of telephonic visit.  ASSESSMENT & PLAN    Time:   Today, I have spent 23 minutes with the patient via telephone discussing the above including Covid precautions.     Visit Diagnoses: 1. Pure hypercholesterolemia -Continue with pravastatin and omega-3 fatty acids pending results of future lab work  2. Essential hypertension -Get blood pressure reading when patient comes to the office for future lab work  3. Primary osteoarthritis of right knee -Continue to take Tylenol for pain.  Walk if possible and always be careful not to put self at risk for falling  4. Hyperlipidemia associated with type 2 diabetes mellitus (HCC) -Continue pravastatin omega-3 fatty  acids and current aggressive therapeutic lifestyle changes to keep weight down and keep blood sugar under control  5. Vitamin D deficiency -Tinea current treatment pending results of lab work  6. Type 2 diabetes mellitus with neurological manifestations, controlled ([place]) -Continue aggressive therapeutic lifestyle changes  7. Diabetic autonomic neuropathy associated with type 2 diabetes mellitus ([place]) -Continue aggressive therapeutic lifestyle changes  8. Diabetes mellitus due to underlying condition with diabetic autonomic neuropathy, without long-term current use of insulin ([place]) -Continue aggressive therapeutic lifestyle changes and get blood work to include hemoglobin A1c  9. Type 2  diabetes mellitus with diabetic neuropathy, without long-term current use of insulin (HCC) -As above check feet regularly Patient Instructions  Continue to practice good hand and respiratory hygiene Continue to drink plenty of fluids and stay well-hydrated Check blood sugars regularly fasting and through the day before meals Check feet regularly Always follow good therapeutic lifestyle changes including diet and exercise      The above assessment and management plan was discussed with the patient. The patient verbalized understanding of and has agreed to the management plan. Patient is aware to call the clinic if symptoms persist or worsen. Patient is aware when to return to the clinic for a follow-up visit. Patient educated on when it is appropriate to go to the emergency department.    [name], MD [address], [place], [place] [address] Ph [phone]   Arrie Senate MD

## 2019-02-18 NOTE — Patient Instructions (Signed)
Continue to practice good hand and respiratory hygiene Continue to drink plenty of fluids and stay well-hydrated Check blood sugars regularly fasting and through the day before meals Check feet regularly Always follow good therapeutic lifestyle changes including diet and exercise

## 2019-03-24 ENCOUNTER — Encounter (HOSPITAL_COMMUNITY): Payer: Self-pay | Admitting: Emergency Medicine

## 2019-03-24 ENCOUNTER — Ambulatory Visit: Payer: Medicare Other | Admitting: Physician Assistant

## 2019-03-24 ENCOUNTER — Emergency Department (HOSPITAL_COMMUNITY): Payer: Medicare Other

## 2019-03-24 ENCOUNTER — Other Ambulatory Visit: Payer: Self-pay

## 2019-03-24 ENCOUNTER — Inpatient Hospital Stay (HOSPITAL_COMMUNITY)
Admission: EM | Admit: 2019-03-24 | Discharge: 2019-03-28 | DRG: 064 | Disposition: A | Discharge status: Home Health | Payer: Medicare Other | Attending: Internal Medicine | Admitting: Internal Medicine

## 2019-03-24 DIAGNOSIS — Z833 Family history of diabetes mellitus: Secondary | ICD-10-CM | POA: Diagnosis not present

## 2019-03-24 DIAGNOSIS — R1084 Generalized abdominal pain: Secondary | ICD-10-CM | POA: Diagnosis not present

## 2019-03-24 DIAGNOSIS — R000 Tachycardia, unspecified: Secondary | ICD-10-CM | POA: Diagnosis not present

## 2019-03-24 DIAGNOSIS — E871 Hypo-osmolality and hyponatremia: Secondary | ICD-10-CM | POA: Diagnosis not present

## 2019-03-24 DIAGNOSIS — K589 Irritable bowel syndrome without diarrhea: Secondary | ICD-10-CM | POA: Diagnosis present

## 2019-03-24 DIAGNOSIS — Z9049 Acquired absence of other specified parts of digestive tract: Secondary | ICD-10-CM | POA: Diagnosis not present

## 2019-03-24 DIAGNOSIS — Z79899 Other long term (current) drug therapy: Secondary | ICD-10-CM

## 2019-03-24 DIAGNOSIS — Z1159 Encounter for screening for other viral diseases: Secondary | ICD-10-CM

## 2019-03-24 DIAGNOSIS — G9341 Metabolic encephalopathy: Secondary | ICD-10-CM | POA: Diagnosis not present

## 2019-03-24 DIAGNOSIS — E0843 Diabetes mellitus due to underlying condition with diabetic autonomic (poly)neuropathy: Secondary | ICD-10-CM

## 2019-03-24 DIAGNOSIS — E1151 Type 2 diabetes mellitus with diabetic peripheral angiopathy without gangrene: Secondary | ICD-10-CM | POA: Diagnosis not present

## 2019-03-24 DIAGNOSIS — D72810 Lymphocytopenia: Secondary | ICD-10-CM | POA: Diagnosis present

## 2019-03-24 DIAGNOSIS — Z7982 Long term (current) use of aspirin: Secondary | ICD-10-CM | POA: Diagnosis not present

## 2019-03-24 DIAGNOSIS — I639 Cerebral infarction, unspecified: Secondary | ICD-10-CM | POA: Diagnosis not present

## 2019-03-24 DIAGNOSIS — M25551 Pain in right hip: Secondary | ICD-10-CM | POA: Diagnosis not present

## 2019-03-24 DIAGNOSIS — E1143 Type 2 diabetes mellitus with diabetic autonomic (poly)neuropathy: Secondary | ICD-10-CM | POA: Diagnosis present

## 2019-03-24 DIAGNOSIS — D696 Thrombocytopenia, unspecified: Secondary | ICD-10-CM | POA: Diagnosis not present

## 2019-03-24 DIAGNOSIS — I10 Essential (primary) hypertension: Secondary | ICD-10-CM | POA: Diagnosis not present

## 2019-03-24 DIAGNOSIS — E876 Hypokalemia: Secondary | ICD-10-CM | POA: Diagnosis present

## 2019-03-24 DIAGNOSIS — R29704 NIHSS score 4: Secondary | ICD-10-CM | POA: Diagnosis present

## 2019-03-24 DIAGNOSIS — R410 Disorientation, unspecified: Secondary | ICD-10-CM

## 2019-03-24 DIAGNOSIS — I6381 Other cerebral infarction due to occlusion or stenosis of small artery: Secondary | ICD-10-CM | POA: Diagnosis not present

## 2019-03-24 DIAGNOSIS — R4701 Aphasia: Secondary | ICD-10-CM | POA: Diagnosis present

## 2019-03-24 DIAGNOSIS — R799 Abnormal finding of blood chemistry, unspecified: Secondary | ICD-10-CM | POA: Diagnosis not present

## 2019-03-24 DIAGNOSIS — E785 Hyperlipidemia, unspecified: Secondary | ICD-10-CM | POA: Diagnosis present

## 2019-03-24 DIAGNOSIS — Z03818 Encounter for observation for suspected exposure to other biological agents ruled out: Secondary | ICD-10-CM | POA: Diagnosis not present

## 2019-03-24 DIAGNOSIS — Z87891 Personal history of nicotine dependence: Secondary | ICD-10-CM | POA: Diagnosis not present

## 2019-03-24 DIAGNOSIS — M25552 Pain in left hip: Secondary | ICD-10-CM | POA: Diagnosis not present

## 2019-03-24 DIAGNOSIS — E1165 Type 2 diabetes mellitus with hyperglycemia: Secondary | ICD-10-CM | POA: Diagnosis not present

## 2019-03-24 LAB — COMPREHENSIVE METABOLIC PANEL
ALT: 37 U/L (ref 0–44)
AST: 35 U/L (ref 15–41)
Albumin: 3.9 g/dL (ref 3.5–5.0)
Alkaline Phosphatase: 64 U/L (ref 38–126)
Anion gap: 11 (ref 5–15)
BUN: 14 mg/dL (ref 8–23)
CO2: 25 mmol/L (ref 22–32)
Calcium: 9.7 mg/dL (ref 8.9–10.3)
Chloride: 94 mmol/L — ABNORMAL LOW (ref 98–111)
Creatinine, Ser: 0.82 mg/dL (ref 0.44–1.00)
GFR calc Af Amer: >60 mL/min (ref >60)
GFR calc non Af Amer: >60 mL/min (ref >60)
Glucose, Bld: 152 mg/dL — ABNORMAL HIGH (ref 70–99)
Potassium: 3.4 mmol/L — ABNORMAL LOW (ref 3.5–5.1)
Sodium: 130 mmol/L — ABNORMAL LOW (ref 135–145)
Total Bilirubin: 1 mg/dL (ref 0.3–1.2)
Total Protein: 6.6 g/dL (ref 6.5–8.1)

## 2019-03-24 LAB — DIFFERENTIAL
Basophils Absolute: 0 10*3/uL (ref 0.0–0.1)
Basophils Relative: 1 %
Eosinophils Absolute: 0 10*3/uL (ref 0.0–0.5)
Eosinophils Relative: 0 %
Lymphocytes Relative: 24 %
Lymphs Abs: 0.7 10*3/uL (ref 0.7–4.0)
Monocytes Absolute: 0.3 10*3/uL (ref 0.1–1.0)
Monocytes Relative: 8 %
Neutro Abs: 2 10*3/uL (ref 1.7–7.7)
Neutrophils Relative %: 66 %

## 2019-03-24 LAB — CBC
HCT: 35.8 % — ABNORMAL LOW (ref 36.0–46.0)
Hemoglobin: 12.4 g/dL (ref 12.0–15.0)
MCH: 31.5 pg (ref 26.0–34.0)
MCHC: 34.6 g/dL (ref 30.0–36.0)
MCV: 90.9 fL (ref 80.0–100.0)
Platelets: 152 10*3/uL (ref 150–400)
RBC: 3.94 MIL/uL (ref 3.87–5.11)
RDW: 12.2 % (ref 11.5–15.5)
WBC: 3 10*3/uL — ABNORMAL LOW (ref 4.0–10.5)
nRBC: 0 % (ref 0.0–0.2)

## 2019-03-24 LAB — APTT: aPTT: 28 seconds (ref 24–36)

## 2019-03-24 LAB — URINALYSIS, ROUTINE W REFLEX MICROSCOPIC
Bilirubin Urine: NEGATIVE
Glucose, UA: NEGATIVE mg/dL
Hgb urine dipstick: NEGATIVE
Ketones, ur: 20 mg/dL — AB
Leukocytes,Ua: NEGATIVE
Nitrite: NEGATIVE
Protein, ur: NEGATIVE mg/dL
Specific Gravity, Urine: 1.023 (ref 1.005–1.030)
pH: 6 (ref 5.0–8.0)

## 2019-03-24 LAB — RAPID URINE DRUG SCREEN, HOSP PERFORMED
Amphetamines: NOT DETECTED
Barbiturates: NOT DETECTED
Benzodiazepines: NOT DETECTED
Cocaine: NOT DETECTED
Opiates: NOT DETECTED
Tetrahydrocannabinol: NOT DETECTED

## 2019-03-24 LAB — MRSA PCR SCREENING: MRSA by PCR: NEGATIVE

## 2019-03-24 LAB — C-REACTIVE PROTEIN: CRP: 7.3 mg/dL — ABNORMAL HIGH (ref <1.0)

## 2019-03-24 LAB — PROCALCITONIN: Procalcitonin: 0.16 ng/mL

## 2019-03-24 LAB — ETHANOL: Alcohol, Ethyl (B): <10 mg/dL (ref <10)

## 2019-03-24 LAB — PROTIME-INR
INR: 1 (ref 0.8–1.2)
Prothrombin Time: 12.9 seconds (ref 11.4–15.2)

## 2019-03-24 LAB — LACTIC ACID, PLASMA
Lactic Acid, Venous: 1.2 mmol/L (ref 0.5–1.9)
Lactic Acid, Venous: 1 mmol/L (ref 0.5–1.9)

## 2019-03-24 LAB — CBG MONITORING, ED: Glucose-Capillary: 153 mg/dL — ABNORMAL HIGH (ref 70–99)

## 2019-03-24 LAB — GLUCOSE, CAPILLARY: Glucose-Capillary: 135 mg/dL — ABNORMAL HIGH (ref 70–99)

## 2019-03-24 LAB — FERRITIN: Ferritin: 717 ng/mL — ABNORMAL HIGH (ref 11–307)

## 2019-03-24 MED ORDER — ENOXAPARIN SODIUM 40 MG/0.4ML ~~LOC~~ SOLN
40.0000 mg | SUBCUTANEOUS | Status: DC
  Administered 2019-03-24 – 2019-03-28 (×5): 40 mg via SUBCUTANEOUS
  Filled 2019-03-24 (×5): qty 0.4

## 2019-03-24 MED ORDER — ASPIRIN EC 81 MG PO TBEC
81.0000 mg | DELAYED_RELEASE_TABLET | Freq: Every day | ORAL | Status: DC
  Administered 2019-03-24 – 2019-03-28 (×5): 81 mg via ORAL
  Filled 2019-03-24 (×10): qty 1

## 2019-03-24 MED ORDER — SODIUM CHLORIDE 0.9 % IV BOLUS
500.0000 mL | Freq: Once | INTRAVENOUS | Status: AC
  Administered 2019-03-24: 500 mL via INTRAVENOUS

## 2019-03-24 MED ORDER — POTASSIUM CHLORIDE IN NACL 20-0.9 MEQ/L-% IV SOLN
INTRAVENOUS | Status: AC
  Administered 2019-03-24 – 2019-03-25 (×2): via INTRAVENOUS

## 2019-03-24 MED ORDER — HYDROCODONE-ACETAMINOPHEN 5-325 MG PO TABS
1.0000 | ORAL_TABLET | Freq: Once | ORAL | Status: AC
  Administered 2019-03-24: 1 via ORAL
  Filled 2019-03-24: qty 1

## 2019-03-24 MED ORDER — ACETAMINOPHEN 160 MG/5ML PO SOLN: 650.0000 mg | ORAL | Status: DC | PRN

## 2019-03-24 MED ORDER — ENOXAPARIN SODIUM 40 MG/0.4ML ~~LOC~~ SOLN: 40.0000 mg | SUBCUTANEOUS | Status: DC

## 2019-03-24 MED ORDER — STROKE: EARLY STAGES OF RECOVERY BOOK
Freq: Once | Status: AC
  Administered 2019-03-25: 10:00:00

## 2019-03-24 MED ORDER — IOHEXOL 300 MG/ML  SOLN
100.0000 mL | Freq: Once | INTRAMUSCULAR | Status: AC | PRN
  Administered 2019-03-24: 100 mL via INTRAVENOUS

## 2019-03-24 MED ORDER — PRAVASTATIN SODIUM 40 MG PO TABS
80.0000 mg | ORAL_TABLET | Freq: Every day | ORAL | Status: DC
  Administered 2019-03-24 – 2019-03-28 (×5): 80 mg via ORAL
  Filled 2019-03-24 (×5): qty 2

## 2019-03-24 MED ORDER — ACETAMINOPHEN 325 MG PO TABS
650.0000 mg | ORAL_TABLET | ORAL | Status: DC | PRN
  Administered 2019-03-24: 650 mg via ORAL
  Filled 2019-03-24: qty 2

## 2019-03-24 MED ORDER — DIPHENHYDRAMINE HCL 25 MG PO CAPS
25.0000 mg | ORAL_CAPSULE | Freq: Every evening | ORAL | Status: AC | PRN
  Administered 2019-03-24: 25 mg via ORAL
  Filled 2019-03-24: qty 1

## 2019-03-24 MED ORDER — ACETAMINOPHEN 650 MG RE SUPP: 650.0000 mg | RECTAL | Status: DC | PRN

## 2019-03-24 MED ORDER — SODIUM CHLORIDE 0.9% FLUSH: 3.0000 mL | Freq: Once | INTRAVENOUS | Status: DC

## 2019-03-24 MED ORDER — INSULIN ASPART 100 UNIT/ML ~~LOC~~ SOLN
0.0000 [IU] | Freq: Three times a day (TID) | SUBCUTANEOUS | Status: DC
  Administered 2019-03-25 (×2): 1 [IU] via SUBCUTANEOUS
  Administered 2019-03-26: 2 [IU] via SUBCUTANEOUS
  Administered 2019-03-26 – 2019-03-27 (×4): 1 [IU] via SUBCUTANEOUS
  Administered 2019-03-27 – 2019-03-28 (×3): 2 [IU] via SUBCUTANEOUS
  Administered 2019-03-28: 1 [IU] via SUBCUTANEOUS

## 2019-03-24 MED ORDER — HALOPERIDOL LACTATE 5 MG/ML IJ SOLN
2.0000 mg | Freq: Once | INTRAMUSCULAR | Status: AC
  Administered 2019-03-24: 2 mg via INTRAVENOUS
  Filled 2019-03-24: qty 1

## 2019-03-24 NOTE — ED Provider Notes (Signed)
Emergency Department Provider Note   I have reviewed the triage vital signs and the nursing notes.   HISTORY  Chief Complaint Altered Mental Status   HPI Theresa Watson is a 85 y.o. female with PMH of DM, IBS, and Diverticulitis presents to the emergency department for evaluation of acute onset confusion.  Patient was seen yesterday by family.  They state that she was not confused but they had to encourage her to eat.  Otherwise, she seemed normal.  Today, they called and the patient did not answer the phone.  A neighbor and family member went to check on her and she seemed very confused.  She was unable to do simple tasks around the house like make coffee.  She seemed to be having trouble finding her words and her speech was not fluent.  The granddaughter at bedside states that she was moaning and grabbing at her abdomen and sometimes her chest but the patient cannot provide any additional details regarding pain in these areas.   Level 5 caveat: AMS  Past Medical History:  Diagnosis Date   Colon polyp    Diabetes mellitus without complication (HCC)    Diverticulosis    Esophagitis    IBS (irritable bowel syndrome)    Kidney stone    hx of 1 kidney stone   Lumbosacral spondylosis without myelopathy    Obesity, mild    Other and unspecified hyperlipidemia    Pain in joint, shoulder region    Prolapse of vaginal walls without mention of uterine prolapse    Symptomatic menopausal or female climacteric states     Patient Active Problem List   Diagnosis Date Noted   Diabetes mellitus due to underlying condition with diabetic autonomic neuropathy, without Ricquel Foulk-term current use of insulin ([place]) 09/18/2015   Annual physical exam 02/26/2015   Type 2 diabetes mellitus with neurological manifestations, controlled ([place]) 08/29/2013   Hypertension 11/13/2012   Cataracts, bilateral 11/13/2012   Symptomatic menopausal or female climacteric states    Hyperlipidemia  associated with type 2 diabetes mellitus (HCC)    Esophagitis    Prolapse of vaginal walls without mention of uterine prolapse    Obesity, mild    IBS (irritable bowel syndrome)    Kidney stone    Lumbosacral spondylosis without myelopathy    Pain in joint, shoulder region    Colon polyp    Diverticulosis     Past Surgical History:  Procedure Laterality Date   CARPAL TUNNEL RELEASE     CHOLECYSTECTOMY     EYE SURGERY Bilateral    cataracts   lumbar back surgery     VAGINAL HYSTERECTOMY     partial    Allergies Levaquin [levofloxacin], Cephalexin, Penicillins, and Lisinopril  Family History  Problem Relation Age of Onset   Cancer Mother        brain   Brain cancer Mother    GI Bleed Father    Arthritis Sister        rheumatoid   Lymphoma Sister    Pancreatic cancer Brother    Diabetes Son    Arthritis Son        back   Cancer Son        prostate   Cancer Maternal Grandfather    Suicidality Paternal Grandfather    Arthritis Son        back   Hyperlipidemia Son    Colitis Son     Social History Social History   Tobacco Use  Smoking status: Former Smoker    Packs/day: 0.50    Types: Cigarettes    Start date: 10/06/1953    Quit date: 02/04/2012    Years since quitting: 7.1   Smokeless tobacco: Never Used  Substance Use Topics   Alcohol use: No   Drug use: No    Review of Systems  Level 5 caveat: AMS  ____________________________________________   PHYSICAL EXAM:  VITAL SIGNS: ED Triage Vitals  Enc Vitals Group     BP 03/24/19 1121 118/64     Pulse Rate 03/24/19 1117 100     Resp 03/24/19 1117 19     Temp 03/24/19 1117 98.5 F (36.9 C)     Temp Source 03/24/19 1117 Oral     SpO2 03/24/19 1117 94 %     Weight 03/24/19 1120 140 lb (63.5 kg)     Height 03/24/19 1120 5\' 2"  (1.575 m)    Constitutional: Alert but confused. No acute distress. Eyes: Conjunctivae are normal.  Head: Atraumatic. Nose: No  congestion/rhinnorhea. Mouth/Throat: Mucous membranes are moist. Neck: No stridor.  Cardiovascular: Normal rate, regular rhythm. Good peripheral circulation. Grossly normal heart sounds.   Respiratory: Normal respiratory effort.  No retractions. Lungs CTAB. Gastrointestinal: Soft with mild diffuse tenderness. No distention.  Musculoskeletal: No lower extremity tenderness nor edema. No gross deformities of extremities. Neurologic: Word finding difficulty with some confusion. No clear dysarthria. Following commands. No gross focal neurologic deficits are appreciated.  Skin:  Skin is warm, dry and intact. No rash noted.   ____________________________________________   LABS (all labs ordered are listed, but only abnormal results are displayed)  Labs Reviewed  COMPREHENSIVE METABOLIC PANEL - Abnormal; Notable for the following components:      Result Value   Sodium 130 (*)    Potassium 3.4 (*)    Chloride 94 (*)    Glucose, Bld 152 (*)    All other components within normal limits  CBC - Abnormal; Notable for the following components:   WBC 3.0 (*)    HCT 35.8 (*)    All other components within normal limits  URINALYSIS, ROUTINE W REFLEX MICROSCOPIC - Abnormal; Notable for the following components:   Ketones, ur 20 (*)    All other components within normal limits  CBG MONITORING, ED - Abnormal; Notable for the following components:   Glucose-Capillary 153 (*)    All other components within normal limits  NOVEL CORONAVIRUS, NAA (HOSPITAL ORDER, SEND-OUT TO REF LAB)  SARS CORONAVIRUS 2 (HOSPITAL ORDER, [facility])  ETHANOL  PROTIME-INR  APTT  RAPID URINE DRUG SCREEN, HOSP PERFORMED  LACTIC ACID, PLASMA  LACTIC ACID, PLASMA  DIFFERENTIAL  FERRITIN  C-REACTIVE PROTEIN  PROCALCITONIN   ____________________________________________  EKG   EKG Interpretation  Date/Time:  Thursday March 24 2019 11:19:35 EDT Ventricular Rate:  100 PR Interval:    QRS  Duration: 85 QT Interval:  339 QTC Calculation: 438 R Axis:   -64 Text Interpretation:  Sinus tachycardia Probable left atrial enlargement Left anterior fascicular block Consider anterior infarct No STEMI No prior tracing for comparison.  Confirmed by Nanda Quinton [phone]) on 03/24/2019 11:33:25 AM       ____________________________________________  RADIOLOGY  Ct Head Wo Contrast  Result Date: 03/24/2019 CLINICAL DATA:  Altered mental status and confusion. EXAM: CT HEAD WITHOUT CONTRAST TECHNIQUE: Contiguous axial images were obtained from the base of the skull through the vertex without intravenous contrast. COMPARISON:  None. FINDINGS: Brain: No evidence of acute  infarction, hemorrhage, hydrocephalus, extra-axial collection or mass lesion/mass effect. Mild generalized atrophy, expected for age. Scattered mild periventricular and subcortical white matter hypodensities are nonspecific, but favored to reflect chronic microvascular ischemic changes. Vascular: Atherosclerotic vascular calcification of the carotid siphons. No hyperdense vessel. Skull: Normal. Negative for fracture or focal lesion. Sinuses/Orbits: No acute finding. Other: None. IMPRESSION: 1.  No acute intracranial abnormality. 2. Mild atrophy and chronic microvascular ischemic changes. Electronically Signed   By: Titus Dubin M.D.   On: 03/24/2019 14:29   Ct Abdomen Pelvis W Contrast  Result Date: 03/24/2019 CLINICAL DATA:  Acute generalized abdominal pain. History of IBS and diverticulosis. EXAM: CT ABDOMEN AND PELVIS WITH CONTRAST TECHNIQUE: Multidetector CT imaging of the abdomen and pelvis was performed using the standard protocol following bolus administration of intravenous contrast. CONTRAST:  146mL OMNIPAQUE IOHEXOL 300 MG/ML  SOLN COMPARISON:  CT dated September 03, 2016. FINDINGS: Lower chest: No acute abnormality. Hepatobiliary: No focal liver abnormality is seen. Status post cholecystectomy. No biliary dilatation.  Pancreas: Unremarkable. No pancreatic ductal dilatation or surrounding inflammatory changes. Spleen: Normal in size without focal abnormality. Adrenals/Urinary Tract: Adrenal glands are unremarkable. Kidneys are normal, without renal calculi, focal lesion, or hydronephrosis. Bladder is unremarkable. Stomach/Bowel: There is scattered colonic diverticula without CT evidence of diverticulitis. There is a moderate amount of stool in the colon. The appendix is normal. There is a small hiatal hernia. The stomach is otherwise unremarkable. Vascular/Lymphatic: Aortic atherosclerosis. No enlarged abdominal or pelvic lymph nodes. Reproductive: Status post hysterectomy. No adnexal masses. Other: No abdominal wall hernia or abnormality. No abdominopelvic ascites. A rectocele and cystocele are noted. Musculoskeletal: Advanced degenerative changes are noted throughout the visualized lumbar spine. There is no displaced fracture. No dislocation. IMPRESSION: 1. No acute intra-abdominal abnormality detected. 2. Chronic changes as above including a rectocele and cystocele. Electronically Signed   By: Constance Holster M.D.   On: 03/24/2019 14:33   Dg Chest Portable 1 View  Result Date: 03/24/2019 CLINICAL DATA:  Altered mental status and confusion EXAM: PORTABLE CHEST 1 VIEW COMPARISON:  10/14/2018 and prior radiographs FINDINGS: The cardiomediastinal silhouette is unremarkable. There is no evidence of focal airspace disease, pulmonary edema, suspicious pulmonary nodule/mass, pleural effusion, or pneumothorax. No acute bony abnormalities are identified. IMPRESSION: No active disease. Electronically Signed   By: Margarette Canada M.D.   On: 03/24/2019 12:23   Dg Hips Bilat W Or Wo Pelvis 2 Views  Result Date: 03/24/2019 CLINICAL DATA:  Hip pain. Bilateral hip pain. Right is more severe than left. EXAM: DG HIP (WITH OR WITHOUT PELVIS) 2V BILAT COMPARISON:  None. FINDINGS: There is osteopenia which limits detection of nondisplaced  fractures. 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 There is mild-to-moderate left-sided osteoarthritis. Evaluation of the right hip is somewhat limited by suboptimal patient positioning. IMPRESSION: No acute osseous abnormality. Electronically Signed   By: Constance Holster M.D.   On: 03/24/2019 13:58    ____________________________________________   PROCEDURES  Procedure(s) performed:   Procedures  None  ____________________________________________   INITIAL IMPRESSION / ASSESSMENT AND PLAN / ED COURSE  Pertinent labs & imaging results that were available during my care of the patient were reviewed by me and considered in my medical decision making (see chart for details).   Patient presents to the emergency department with acute onset mental status change.  Last seen normal yesterday afternoon.  Patient outside the window for any stroke intervention.  Stroke is a consideration here as many of her issues appear to be speech  related.  She is exhibiting some word finding difficulty and does appear frustrated with not being able to get certain points across.  She is following the neurologic exam briskly.  Patient does have some mild, diffuse abdominal tenderness on exam.  Granddaughter reports history of diverticulitis and poor eating yesterday so do plan for CT imaging of both the head and abdomen/pelvis.  Obtain screening labs.  Low suspicion for sepsis but heart rate is initially 100 F without fever.  No specific respiratory symptoms or hypoxemia. Doubt COVID.   03:31 PM  Labs and imaging reviewed.  No acute findings or explanation for the patient's change in mental status.  Was given a very low-dose Haldol for MRI to help remain still and calm during the imaging.   Discussed patient's case with Hospitalist, Dr. Jamse Arn to request admission. Patient and family (if present) updated with plan. Care transferred to Hospitalist service.  I reviewed all nursing notes, vitals,  pertinent old records, EKGs, labs, imaging (as available).  ____________________________________________  FINAL CLINICAL IMPRESSION(S) / ED DIAGNOSES  Final diagnoses:  Delirium  Generalized abdominal pain    MEDICATIONS GIVEN DURING THIS VISIT:  Medications  sodium chloride flush (NS) 0.9 % injection 3 mL (3 mLs Intravenous Not Given 03/24/19 1125)  sodium chloride 0.9 % bolus 500 mL (0 mLs Intravenous Stopped 03/24/19 1335)  iohexol (OMNIPAQUE) 300 MG/ML solution 100 mL (100 mLs Intravenous Contrast Given 03/24/19 1405)  haloperidol lactate (HALDOL) injection 2 mg (2 mg Intravenous Given 03/24/19 1516)   Note:  This document was prepared using Dragon voice recognition software and may include unintentional dictation errors.  Nanda Quinton, MD Emergency Medicine    Keiara Sneeringer, [facility], MD 03/24/19 1536

## 2019-03-24 NOTE — Consult Note (Signed)
[facility] Consult Services  Stat Consult  Date of Service:   03/24/2019 16:28:53  Impression:     .  Rule Out Acute Ischemic Stroke     .  Encephalopathy  Comments/Sign-Out: 85 year old female with altered mental status. Small right thalamic stroke may be a source of confusion but there are no other focal deficits to go along with this possible infarct. Presentation may also be due to underlying metabolic or infectious encephalopathy.  CT HEAD: Reviewed MRI Reviewed -- showed faint DWI changes in the right thalamus.  Metrics: [facility] Notification Time: 03/24/2019 16:27:47 Stamp Time: 03/24/2019 16:28:53 Callback Response Time: 03/24/2019 16:34:00 Video Start Time: 03/24/2019 16:34:11 Video End Time: 03/24/2019 16:45:00  CT head showed no acute hemorrhage or acute core infarct.  Our recommendations are outlined below.  Recommendations:     .  Antiplatelet Therapy Recommended  Imaging Studies:     Marland Kitchen  MRA Head and Neck Without Contrast When Available - Stroke Protocol     .  Echocardiogram - Transthoracic Echocardiogram  Therapies:     .  Physical Therapy, Occupational Therapy, Speech Therapy Assessment When Applicable  Other WorkUp:     .  Infectious/metabolic workup per primary team  Disposition: Neurology Follow Up Recommended  Sign Out:     .  Discussed with Emergency Department Provider  ----------------------------------------------------------------------------------------------------  Chief Complaint: Stroke  History of Present Illness: Patient is a 85 year old Female.  85 year old female who presents to the hospital because of altered mental status. MRI done in the ED showed a right thalamic infarct. Patient does not have any noticeable focal deficits.  Examination: 1A: Level of Consciousness - Alert; keenly responsive + 0 1B: Ask Month and Age - Could Not Answer Either Question Correctly + 2 1C: Blink Eyes  & Squeeze Hands - Performs Both Tasks + 0 2: Test Horizontal Extraocular Movements - Normal + 0 3: Test Visual Fields - No Visual Loss + 0 4: Test Facial Palsy (Use Grimace if Obtunded) - Normal symmetry + 0 5A: Test Left Arm Motor Drift - No Drift for 10 Seconds + 0 5B: Test Right Arm Motor Drift - No Drift for 10 Seconds + 0 6A: Test Left Leg Motor Drift - No Drift for 5 Seconds + 0 6B: Test Right Leg Motor Drift - No Drift for 5 Seconds + 0 7: Test Limb Ataxia (FNF/Heel-Shin) - No Ataxia + 0 8: Test Sensation - Normal; No sensory loss + 0 9: Test Language/Aphasia - Mild-Moderate Aphasia: Some Obvious Changes, Without Significant Limitation + 1 10: Test Dysarthria - Mild-Moderate Dysarthria: Slurring but can be understood + 1 11: Test Extinction/Inattention - No abnormality + 0  NIHSS Score: 4   Due to the immediate potential for life-threatening deterioration due to underlying acute neurologic illness, I spent 35 minutes providing critical care. This time includes time for face to face visit via telemedicine, review of medical records, imaging studies and discussion of findings with providers, the patient and/or family.   Dr Tsosie Billing   [facility] [phone]   Case [id number]

## 2019-03-24 NOTE — H&P (Signed)
History and Physical:    Theresa Watson   HYQ:[id number] DOB: 1932/12/27 DOA: 03/24/2019  Referring MD/provider: Dr Laverta Baltimore PCP: [name], MD   Patient coming from: Home  Chief Complaint: confusion  History of Present Illness:   Theresa Watson is an 85 y.o. female with past medical history significant for hypertension, hyperlipidemia and diet-controlled diabetes who was in her usual state of excellent health until this morning when she was noted to be altered and confused.  At baseline patient is very functional in fact she killed a copperhead in her yard last week.  She has no mental limitations in her physical limitations are only her knee pain.  This morning she did not answer the phone when her neighbor called and when they were able to get to her she apparently was wandering about her kitchen not knowing how to make coffee which is extremely unusual for her.  Patient was brought to her granddaughter's house, she was able to walk to the car and follow directions.  From her grand daughter's house patient was brought to the ED again but via private vehicle, patient was cooperative.  In the ED patient was apparently following commands but was agitated and moaning and was intermittently clutching her abdomen or her legs or her hip but was not able to vocalize the cause of her distress, she apparently denied pain.  When I went to see her patient was somnolent, she had just received Haldol for the MRI.  History is entirely per patient's granddaughter Ronalee Belts.  Patient herself was unable to provide any history.  ED Course:  The patient was noted to be awake alert and agitated.  She was afebrile and vital signs were within normal limits, laboratory data were notable for mild hyponatremia.  Head CT abdomen CT and chest x-ray were negative.  Urine was negative for any infection.  Patient is admitted for acute delirium of unclear etiology.  ROS:   ROS   Review of Systems: Patient  unable to provide review of systems due to mental status.  Past Medical History:   Past Medical History:  Diagnosis Date   Colon polyp    Diabetes mellitus without complication (HCC)    Diverticulosis    Esophagitis    IBS (irritable bowel syndrome)    Kidney stone    hx of 1 kidney stone   Lumbosacral spondylosis without myelopathy    Obesity, mild    Other and unspecified hyperlipidemia    Pain in joint, shoulder region    Prolapse of vaginal walls without mention of uterine prolapse    Symptomatic menopausal or female climacteric states     Past Surgical History:   Past Surgical History:  Procedure Laterality Date   CARPAL TUNNEL RELEASE     CHOLECYSTECTOMY     EYE SURGERY Bilateral    cataracts   lumbar back surgery     VAGINAL HYSTERECTOMY     partial    Social History:   Social History   Socioeconomic History   Marital status: Divorced    Spouse name: Not on file   Number of children: 5   Years of education: GED   Highest education level: GED or equivalent  Occupational History   Occupation: retired  Scientist, product/process development strain: Not hard at all   Food insecurity    Worry: Never true    Inability: Never true   Transportation needs    Medical: No  Non-medical: No  Tobacco Use   Smoking status: Former Smoker    Packs/day: 0.50    Types: Cigarettes    Start date: 10/06/1953    Quit date: 02/04/2012    Years since quitting: 7.1   Smokeless tobacco: Never Used  Substance and Sexual Activity   Alcohol use: No   Drug use: No   Sexual activity: Not Currently  Lifestyle   Physical activity    Days per week: Not on file    Minutes per session: Not on file   Stress: Not on file  Relationships   Social connections    Talks on phone: More than three times a week    Gets together: More than three times a week    Attends religious service: More than 4 times per year    Active member of club or organization:  Yes    Attends meetings of clubs or organizations: More than 4 times per year    Relationship status: Divorced   Intimate partner violence    Fear of current or ex partner: No    Emotionally abused: No    Physically abused: No    Forced sexual activity: No  Other Topics Concern   Not on file  Social History Narrative   Not on file    Allergies   Levaquin [levofloxacin], Cephalexin, Penicillins, and Lisinopril  Family history:   Family History  Problem Relation Age of Onset   Cancer Mother        brain   Brain cancer Mother    GI Bleed Father    Arthritis Sister        rheumatoid   Lymphoma Sister    Pancreatic cancer Brother    Diabetes Son    Arthritis Son        back   Cancer Son        prostate   Cancer Maternal Grandfather    Suicidality Paternal Grandfather    Arthritis Son        back   Hyperlipidemia Son    Colitis Son     Current Medications:   Prior to Admission medications   Medication Sig Start Date End Date Taking? Authorizing Provider  ACCU-CHEK AVIVA PLUS test strip TEST TWO TIMES DAILY AS  DIRECTED 05/17/18   [name], MD  aspirin 81 MG EC tablet Take 81 mg by mouth daily.      [provider]  Calcium Carb-Cholecalciferol (CALCIUM 600 + D PO) Take by mouth.    [provider]  Cholecalciferol (VITAMIN D3) 2000 UNITS TABS Take 1 tablet by mouth daily.      [provider]  ferrous sulfate 325 (65 FE) MG EC tablet Take 325 mg by mouth daily with breakfast.      [provider]  gabapentin (NEURONTIN) 100 MG capsule Take 1 cap PO in the AM and 3 caps po QHS 05/19/18   [name], MD  Garlic 026 MG TABS Take 1 tablet by mouth 2 (two) times daily.     [provider]  geriatric multivitamins-minerals (ELDERTONIC/GEVRABON) ELIX Take 15 mLs by mouth daily.      [provider]  Icosapent Ethyl 1 g CAPS Take 2 capsules (2 g total) by mouth 2 (two) times daily. Patient not  taking: Reported on 02/18/2019 10/15/18   [name], MD  Omega-3 Fatty Acids (FISH OIL) 1000 MG CAPS Take 2 capsules by mouth daily.     [provider]  omeprazole (PRILOSEC) 20 MG capsule Take 20 mg by mouth daily.      [provider]  OVER THE COUNTER MEDICATION Vit C, vit E, green tea pill, garlic, vit S06    [provider]  potassium chloride SA (K-DUR,KLOR-CON) 20 MEQ tablet TAKE 1 TABLET BY MOUTH  DAILY 11/23/18   [name], MD  pravastatin (PRAVACHOL) 80 MG tablet TAKE 1 TABLET BY MOUTH  DAILY 11/23/18   [name], MD  triamterene-hydrochlorothiazide [facility]) 37.5-25 MG tablet TAKE 1 TABLET BY MOUTH  DAILY 11/23/18   [name], MD    Physical Exam:   Vitals:   03/24/19 1117 03/24/19 1120 03/24/19 1121 03/24/19 1415  BP:   118/64 (!) 119/56  Pulse: 100   80  Resp: 19   18  Temp: 98.5 F (36.9 C)     TempSrc: Oral     SpO2: 94%   98%  Weight:  63.5 kg    Height:  5\' 2"  (1.575 m)       Physical Exam: Blood pressure (!) 119/56, pulse 80, temperature 98.5 F (36.9 C), temperature source Oral, resp. rate 18, height 5\' 2"  (1.575 m), weight 63.5 kg, SpO2 98 %. Gen: Obese female lying flat in bed in no acute distress, somnolent.  Eyes: Sclerae anicteric. Conjunctiva mildly injected. Chest: Moderately good air entry bilaterally with no adventitious sounds.  CV: Distant, regular, no audible murmurs. Abdomen: NABS, soft, nondistended, nontender. No rebound, no guarding. Extremities: No edema.  Skin: Warm and dry. No rashes, lesions or wounds. Neuro: Somnolent with GCS score is 10, draws to pain, opens eyes to verbal command, incomprehensible speech.   Data Review:    Labs: Basic Metabolic Panel: Recent Labs  Lab 03/24/19 1144  NA 130*  K 3.4*  CL 94*  CO2 25  GLUCOSE 152*  BUN 14  CREATININE 0.82  CALCIUM 9.7   Liver Function Tests: Recent Labs  Lab 03/24/19 1144  AST 35  ALT 37  ALKPHOS 64  BILITOT 1.0   PROT 6.6  ALBUMIN 3.9   No results for input(s): LIPASE, AMYLASE in the last 168 hours. No results for input(s): AMMONIA in the last 168 hours. CBC: Recent Labs  Lab 03/24/19 1144  WBC 3.0*  NEUTROABS 2.0  HGB 12.4  HCT 35.8*  MCV 90.9  PLT 152   Cardiac Enzymes: No results for input(s): CKTOTAL, CKMB, CKMBINDEX, TROPONINI in the last 168 hours.  BNP (last 3 results) No results for input(s): PROBNP in the last 8760 hours. CBG: Recent Labs  Lab 03/24/19 1126  GLUCAP 153*    Urinalysis    Component Value Date/Time   COLORURINE YELLOW 03/24/2019 1148   APPEARANCEUR CLEAR 03/24/2019 1148   LABSPEC 1.023 03/24/2019 1148   PHURINE 6.0 03/24/2019 1148   GLUCOSEU NEGATIVE 03/24/2019 1148   HGBUR NEGATIVE 03/24/2019 1148   BILIRUBINUR NEGATIVE 03/24/2019 1148   BILIRUBINUR neg 04/16/2015 0854   KETONESUR 20 (A) 03/24/2019 1148   PROTEINUR NEGATIVE 03/24/2019 1148   UROBILINOGEN negative 04/16/2015 0854   NITRITE NEGATIVE 03/24/2019 1148   LEUKOCYTESUR NEGATIVE 03/24/2019 1148      Radiographic Studies: Ct Head Wo Contrast  Result Date: 03/24/2019 CLINICAL DATA:  Altered mental status and confusion. EXAM: CT HEAD WITHOUT CONTRAST TECHNIQUE: Contiguous axial images were obtained from the base of the skull through the vertex without intravenous contrast. COMPARISON:  None. FINDINGS: Brain: No evidence of acute infarction, hemorrhage, hydrocephalus, extra-axial collection or mass lesion/mass  effect. Mild generalized atrophy, expected for age. Scattered mild periventricular and subcortical white matter hypodensities are nonspecific, but favored to reflect chronic microvascular ischemic changes. Vascular: Atherosclerotic vascular calcification of the carotid siphons. No hyperdense vessel. Skull: Normal. Negative for fracture or focal lesion. Sinuses/Orbits: No acute finding. Other: None. IMPRESSION: 1.  No acute intracranial abnormality. 2. Mild atrophy and chronic microvascular  ischemic changes. Electronically Signed   By: Titus Dubin M.D.   On: 03/24/2019 14:29   Mr Brain Wo Contrast  Result Date: 03/24/2019 CLINICAL DATA:  Acute presentation with altered level of consciousness and confusion. EXAM: MRI HEAD WITHOUT CONTRAST TECHNIQUE: Multiplanar, multiecho pulse sequences of the brain and surrounding structures were obtained without intravenous contrast. COMPARISON:  Head CT earlier same day FINDINGS: Brain: Diffusion imaging shows a small focus restricted diffusion in the right thalamus consistent with acute infarction. No other acute insult. There are mild chronic small-vessel ischemic changes of the pons. No focal cerebellar abnormality. Cerebral hemispheres show moderate changes of chronic small vessel disease affecting the deep and subcortical white matter. No cortical or large vessel territory infarction. No mass lesion, hemorrhage, hydrocephalus or extra-axial collection. Vascular: Major vessels at the base of the brain show flow. Skull and upper cervical spine: Negative Sinuses/Orbits: Clear/normal Other: None IMPRESSION: Small area of acute infarction suspected in the right thalamus. No swelling or mass effect. Chronic small-vessel ischemic changes elsewhere throughout the brain as outlined above. Electronically Signed   By: Nelson Chimes M.D.   On: 03/24/2019 16:07   Ct Abdomen Pelvis W Contrast  Result Date: 03/24/2019 CLINICAL DATA:  Acute generalized abdominal pain. History of IBS and diverticulosis. EXAM: CT ABDOMEN AND PELVIS WITH CONTRAST TECHNIQUE: Multidetector CT imaging of the abdomen and pelvis was performed using the standard protocol following bolus administration of intravenous contrast. CONTRAST:  136mL OMNIPAQUE IOHEXOL 300 MG/ML  SOLN COMPARISON:  CT dated September 03, 2016. FINDINGS: Lower chest: No acute abnormality. Hepatobiliary: No focal liver abnormality is seen. Status post cholecystectomy. No biliary dilatation. Pancreas: Unremarkable. No  pancreatic ductal dilatation or surrounding inflammatory changes. Spleen: Normal in size without focal abnormality. Adrenals/Urinary Tract: Adrenal glands are unremarkable. Kidneys are normal, without renal calculi, focal lesion, or hydronephrosis. Bladder is unremarkable. Stomach/Bowel: There is scattered colonic diverticula without CT evidence of diverticulitis. There is a moderate amount of stool in the colon. The appendix is normal. There is a small hiatal hernia. The stomach is otherwise unremarkable. Vascular/Lymphatic: Aortic atherosclerosis. No enlarged abdominal or pelvic lymph nodes. Reproductive: Status post hysterectomy. No adnexal masses. Other: No abdominal wall hernia or abnormality. No abdominopelvic ascites. A rectocele and cystocele are noted. Musculoskeletal: Advanced degenerative changes are noted throughout the visualized lumbar spine. There is no displaced fracture. No dislocation. IMPRESSION: 1. No acute intra-abdominal abnormality detected. 2. Chronic changes as above including a rectocele and cystocele. Electronically Signed   By: Constance Holster M.D.   On: 03/24/2019 14:33   Dg Chest Portable 1 View  Result Date: 03/24/2019 CLINICAL DATA:  Altered mental status and confusion EXAM: PORTABLE CHEST 1 VIEW COMPARISON:  10/14/2018 and prior radiographs FINDINGS: The cardiomediastinal silhouette is unremarkable. There is no evidence of focal airspace disease, pulmonary edema, suspicious pulmonary nodule/mass, pleural effusion, or pneumothorax. No acute bony abnormalities are identified. IMPRESSION: No active disease. Electronically Signed   By: Margarette Canada M.D.   On: 03/24/2019 12:23   Dg Hips Bilat W Or Wo Pelvis 2 Views  Result Date: 03/24/2019 CLINICAL DATA:  Hip pain. Bilateral hip  pain. Right is more severe than left. EXAM: DG HIP (WITH OR WITHOUT PELVIS) 2V BILAT COMPARISON:  None. FINDINGS: There is osteopenia which limits detection of nondisplaced fractures. 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 There is mild-to-moderate left-sided osteoarthritis. Evaluation of the right hip is somewhat limited by suboptimal patient positioning. IMPRESSION: No acute osseous abnormality. Electronically Signed   By: Constance Holster M.D.   On: 03/24/2019 13:58    EKG: Independently reviewed. NSR 60, LAD -60, nonspecific st tw abnormalities   Assessment/Plan:   Principal Problem:   Acute metabolic encephalopathy Active Problems:   IBS (irritable bowel syndrome)   Hypertension   Diabetes mellitus due to underlying condition with diabetic autonomic neuropathy, without long-term current use of insulin (HCC)   Hyponatremia  85 year old highly functional female was acutely confused earlier today with unclear etiology.  Work-up reveals mild hyponatremia and possible right thalamic stroke on MRI.  Patient was seen by telemetry neurology consultation who recommend antiplatelet therapy and MRA head and neck without contrast to follow-up.   ACUTE METABOLIC ENCEPHALOPATHY I suspect this may well be secondary to stroke given suddenness of onset without prior intercurrent illness.  Work-up is unrevealing for any infectious etiology, patient is afebrile with no leukocytosis.  However will order SARS-CoV-2 given leukopenia with some lymphopenia as well.    ACUTE THALAMIC STROKE Patient is already on aspirin 81 mg daily She can be started on Plavix versus Aggrenox per neurology recommendations tomorrow MRA head and neck and echocardiogram requested per neuro consultation Follow-up neurology in the morning Patient is not hypertensive, I am holding her HCTZ for permissive hypertension if it does occur. Continue Pravachol 80 mg.  HYPONATREMIA Will provide gentle hydration with normal saline with 20 of K at 125 cc an hour.  HTN Medicine reconciliation has not been completed however patient was apparently on HCTZ at last PCP visit last month.  I am holding HCTZ for now  anyway.  DM2 Appears to be diet controlled Will need to have good glucose control, fingersticks AC at bedtime requested Will need carb controlled diet when she is able to swallow safely.  HL Continue Pravachol    Other information:   DVT prophylaxis: Lovenox ordered. Code Status: Full code. Family Communication: Spoke with granddaughter Conservation officer, historic buildings Disposition Plan: TBD Consults called: Tele-neurology Admission status: Inpatient  The medical decision making on this patient was of high complexity and the patient is at high risk for clinical deterioration, therefore this is a level 3 visit.   Dewaine Oats Tublu Jezebel Pollet [facility]  If 7PM-7AM, please contact night-coverage [email or web address] Password [facility] 03/24/2019, 4:15 PM

## 2019-03-24 NOTE — Progress Notes (Signed)
Bedside swallow screen performed. Pt able to swallow water through a straw without any complications. Pt also able to swallow apple sauce without any complications. Pt requires dentures to chew and swallow crackers, but otherwise no complications. Pt able to swallow pills whole with no complications.

## 2019-03-24 NOTE — Progress Notes (Signed)
MR Head/Neck ordered by MD. MD made aware there is no one here to perform these scans at this time of day and they will have to wait until tomorrow. MD said she is aware and that is ok.

## 2019-03-24 NOTE — ED Triage Notes (Signed)
Onset of confusion yesterday. Patient was not answering her phone today. Neighbor went to check on her and she was very confused and unable to make her own coffee, does not know where she is at, unable to dress herself.

## 2019-03-25 ENCOUNTER — Inpatient Hospital Stay (HOSPITAL_COMMUNITY): Payer: Medicare Other

## 2019-03-25 DIAGNOSIS — G9341 Metabolic encephalopathy: Secondary | ICD-10-CM

## 2019-03-25 DIAGNOSIS — I639 Cerebral infarction, unspecified: Secondary | ICD-10-CM

## 2019-03-25 LAB — CBC WITH DIFFERENTIAL/PLATELET
Abs Immature Granulocytes: 0.03 10*3/uL (ref 0.00–0.07)
Basophils Absolute: 0 10*3/uL (ref 0.0–0.1)
Basophils Relative: 1 %
Eosinophils Absolute: 0 10*3/uL (ref 0.0–0.5)
Eosinophils Relative: 0 %
HCT: 34.8 % — ABNORMAL LOW (ref 36.0–46.0)
Hemoglobin: 11.7 g/dL — ABNORMAL LOW (ref 12.0–15.0)
Immature Granulocytes: 2 %
Lymphocytes Relative: 32 %
Lymphs Abs: 0.6 10*3/uL — ABNORMAL LOW (ref 0.7–4.0)
MCH: 31.3 pg (ref 26.0–34.0)
MCHC: 33.6 g/dL (ref 30.0–36.0)
MCV: 93 fL (ref 80.0–100.0)
Monocytes Absolute: 0.2 10*3/uL (ref 0.1–1.0)
Monocytes Relative: 10 %
Neutro Abs: 1.1 10*3/uL — ABNORMAL LOW (ref 1.7–7.7)
Neutrophils Relative %: 55 %
Platelets: 137 10*3/uL — ABNORMAL LOW (ref 150–400)
RBC: 3.74 MIL/uL — ABNORMAL LOW (ref 3.87–5.11)
RDW: 12.5 % (ref 11.5–15.5)
WBC: 1.9 10*3/uL — ABNORMAL LOW (ref 4.0–10.5)
nRBC: 0 % (ref 0.0–0.2)

## 2019-03-25 LAB — GLUCOSE, CAPILLARY
Glucose-Capillary: 127 mg/dL — ABNORMAL HIGH (ref 70–99)
Glucose-Capillary: 144 mg/dL — ABNORMAL HIGH (ref 70–99)
Glucose-Capillary: 93 mg/dL (ref 70–99)
Glucose-Capillary: 167 mg/dL — ABNORMAL HIGH (ref 70–99)

## 2019-03-25 LAB — SARS CORONAVIRUS 2 BY RT PCR (HOSPITAL ORDER, PERFORMED IN ~~LOC~~ HOSPITAL LAB): SARS Coronavirus 2: NEGATIVE

## 2019-03-25 LAB — SEDIMENTATION RATE: Sed Rate: 11 mm/hr (ref 0–22)

## 2019-03-25 LAB — ECHOCARDIOGRAM COMPLETE
Height: 62 in
Weight: 2553.81 oz

## 2019-03-25 LAB — NOVEL CORONAVIRUS, NAA (HOSP ORDER, SEND-OUT TO REF LAB; TAT 18-24 HRS): SARS-CoV-2, NAA: NOT DETECTED

## 2019-03-25 LAB — VITAMIN B12: Vitamin B-12: 950 pg/mL — ABNORMAL HIGH (ref 180–914)

## 2019-03-25 LAB — LACTATE DEHYDROGENASE: LDH: 240 U/L — ABNORMAL HIGH (ref 98–192)

## 2019-03-25 LAB — PROCALCITONIN: Procalcitonin: 0.18 ng/mL

## 2019-03-25 LAB — FERRITIN: Ferritin: 1064 ng/mL — ABNORMAL HIGH (ref 11–307)

## 2019-03-25 LAB — CK: Total CK: 234 U/L (ref 38–234)

## 2019-03-25 LAB — LIPID PANEL
Cholesterol: 131 mg/dL (ref 0–200)
HDL: 24 mg/dL — ABNORMAL LOW (ref >40)
LDL Cholesterol: 60 mg/dL (ref 0–99)
Total CHOL/HDL Ratio: 5.5 RATIO
Triglycerides: 233 mg/dL — ABNORMAL HIGH (ref <150)
VLDL: 47 mg/dL — ABNORMAL HIGH (ref 0–40)

## 2019-03-25 LAB — HEMOGLOBIN A1C
Hgb A1c MFr Bld: 7.2 % — ABNORMAL HIGH (ref 4.8–5.6)
Mean Plasma Glucose: 159.94 mg/dL

## 2019-03-25 LAB — D-DIMER, QUANTITATIVE: D-Dimer, Quant: 0.7 ug/mL-FEU — ABNORMAL HIGH (ref 0.00–0.50)

## 2019-03-25 LAB — C-REACTIVE PROTEIN: CRP: 8.1 mg/dL — ABNORMAL HIGH (ref <1.0)

## 2019-03-25 LAB — SARS CORONAVIRUS 2: SARS Coronavirus 2: NOT DETECTED

## 2019-03-25 MED ORDER — ALPRAZOLAM 0.25 MG PO TABS
0.2500 mg | ORAL_TABLET | Freq: Once | ORAL | Status: AC
  Administered 2019-03-25: 0.25 mg via ORAL
  Filled 2019-03-25: qty 1

## 2019-03-25 NOTE — Progress Notes (Signed)
Family called & updated. Assisted patient to dial out on room phone to speak with family.

## 2019-03-25 NOTE — Progress Notes (Addendum)
PROGRESS NOTE  Theresa Watson CHE:[id number] DOB: 1932-11-24 DOA: 03/24/2019 PCP: [name], MD  Brief History:  85 year old female with a history of hypertension, hyperlipidemia, diabetes mellitus presenting with altered mental status.  The patient at baseline is highly functional and able to perform all her ADLs without assistance.  The patient was last seen normal in the afternoon of 03/23/2019.  In the morning of 03/24/2019, the patient did not answer her phone when she was called.  A neighbor went to check up on her and noted the patient to be confused and wandering with word finding difficulties.  MRI of the brain showed a small acute right thalamic stroke.  As result, the patient was brought to the hospital for further evaluation.  Since admission, the patient developed a fever of 101.1 F.  The patient has leukopenia and lymphopenia and thrombocytopenia.  Repeat COVID testing has been ordered as pt is PUI.  The patient herself denies any headache, neck pain, chest pain, shortness breath, coughing, nausea, vomiting, diarrhea, abdominal pain, dysuria.  There is no anosmia.  She denies any visual disturbance or focal extremity weakness.  The patient is being transferred to [facility] as a PUI and for neurology eval.  Assessment/Plan: Acute right thalamic stroke -Neurology Consult--spoke with Dr. Rory Percy -PT/OT evaluation -Speech therapy eval -CT brain--neg -MRI brain--acute right thalamic stroke -MRA brain-- -Carotid Duplex-- -Echo-- -LDL--60 -HbA1C-- -Antiplatelet--ASA 81 mg  Fever/leukopenia/thrombocytopenia -she is currently being treated as a PUI for COVID-19 -CEPHEID test ordered -Ferritin 717 -CRP 7.3 -Procalcitonin 0.16 -Check LDH, ESR, d-dimer, CK -blood cultures x 2 sets  Hyperlipidemia -Continue statin  Essential hypertension -Holding Maxide to allow for permissive hypertension  Diabetes Mellitus type 2 -Check hemoglobin A1c -novolog ISS -not on any  meds outpt       Disposition Plan:   Transfer to [facility]  Family Communication:   Daughter updated on phone 6/19  Consultants:  Neurology--Arora  Code Status:  FULL   DVT Prophylaxis:  [place] Lovenox   Procedures: As Listed in Progress Note Above  Antibiotics: None    PPE-N95 mask, gown, goggles   Subjective: Patient denies fevers, chills, headache, chest pain, dyspnea, nausea, vomiting, diarrhea, abdominal pain, dysuria, hematuria, hematochezia, and melena.   Objective: Vitals:   03/25/19 0500 03/25/19 0600 03/25/19 0700 03/25/19 0730  BP: 116/65 117/78 (!) 130/103   Pulse: 78 88 95   Resp: (!) 25 20    Temp: 99 F (37.2 C)   99.4 F (37.4 C)  TempSrc: Oral   Oral  SpO2: 96% 94% 96%   Weight:      Height:        Intake/Output Summary (Last 24 hours) at 03/25/2019 0754 Last data filed at 03/25/2019 0618 Gross per 24 hour  Intake 1876.28 ml  Output 150 ml  Net 1726.28 ml   Weight change:  Exam:   General:  Pt is alert, follows commands appropriately, not in acute distress  HEENT: No icterus, No thrush, No neck mass, [place]/AT  Cardiovascular: RRR, S1/S2, no rubs, no gallops  Respiratory: CTA bilaterally, no wheezing, no crackles, no rhonchi  Abdomen: Soft/+BS, non tender, non distended, no guarding  Extremities: No edema, No lymphangitis, No petechiae, No rashes, no synovitis  Neuro:  CN II-XII intact, strength 4/5 in RUE, RLE, strength 4/5 LUE, LLE; sensation intact bilateral; no dysmetria; babinski equivocal     Data Reviewed: I have personally reviewed following labs and  imaging studies Basic Metabolic Panel: Recent Labs  Lab 03/24/19 1144  NA 130*  K 3.4*  CL 94*  CO2 25  GLUCOSE 152*  BUN 14  CREATININE 0.82  CALCIUM 9.7   Liver Function Tests: Recent Labs  Lab 03/24/19 1144  AST 35  ALT 37  ALKPHOS 64  BILITOT 1.0  PROT 6.6  ALBUMIN 3.9   No results for input(s): LIPASE, AMYLASE in the last 168 hours. No results for  input(s): AMMONIA in the last 168 hours. Coagulation Profile: Recent Labs  Lab 03/24/19 1145  INR 1.0   CBC: Recent Labs  Lab 03/24/19 1144  WBC 3.0*  NEUTROABS 2.0  HGB 12.4  HCT 35.8*  MCV 90.9  PLT 152   Cardiac Enzymes: No results for input(s): CKTOTAL, CKMB, CKMBINDEX, TROPONINI in the last 168 hours. BNP: Invalid input(s): POCBNP CBG: Recent Labs  Lab 03/24/19 1126 03/24/19 2105  GLUCAP 153* 135*   HbA1C: No results for input(s): HGBA1C in the last 72 hours. Urine analysis:    Component Value Date/Time   COLORURINE YELLOW 03/24/2019 1148   APPEARANCEUR CLEAR 03/24/2019 1148   LABSPEC 1.023 03/24/2019 1148   PHURINE 6.0 03/24/2019 1148   GLUCOSEU NEGATIVE 03/24/2019 1148   HGBUR NEGATIVE 03/24/2019 1148   BILIRUBINUR NEGATIVE 03/24/2019 1148   BILIRUBINUR neg 04/16/2015 0854   KETONESUR 20 (A) 03/24/2019 1148   PROTEINUR NEGATIVE 03/24/2019 1148   UROBILINOGEN negative 04/16/2015 0854   NITRITE NEGATIVE 03/24/2019 1148   LEUKOCYTESUR NEGATIVE 03/24/2019 1148   Sepsis Labs: _0 (procalcitonin:4,lacticidven:4) ) Recent Results (from the past 240 hour(s))  MRSA PCR Screening     Status: None   Collection Time: 03/24/19  5:32 PM   Specimen: Nasal Mucosa; Nasopharyngeal  Result Value Ref Range Status   MRSA by PCR NEGATIVE NEGATIVE Final    Comment:        The GeneXpert MRSA Assay (FDA approved for NASAL specimens only), is one component of a comprehensive MRSA colonization surveillance program. It is not intended to diagnose MRSA infection nor to guide or monitor treatment for MRSA infections. Performed at [facility], [address], [place], [place] [address]      Scheduled Meds:   stroke: mapping our early stages of recovery book   Does not apply Once   aspirin EC  81 mg Oral Daily   enoxaparin (LOVENOX) injection  40 mg Subcutaneous Q24H   insulin aspart  0-9 Units Subcutaneous TID WC   pravastatin  80 mg Oral q1800    sodium chloride flush  3 mL Intravenous Once   Continuous Infusions:  0.9 % NaCl with KCl 20 mEq / L 125 mL/hr at 03/25/19 7628    Procedures/Studies: Ct Head Wo Contrast  Result Date: 03/24/2019 CLINICAL DATA:  Altered mental status and confusion. EXAM: CT HEAD WITHOUT CONTRAST TECHNIQUE: Contiguous axial images were obtained from the base of the skull through the vertex without intravenous contrast. COMPARISON:  None. FINDINGS: Brain: No evidence of acute infarction, hemorrhage, hydrocephalus, extra-axial collection or mass lesion/mass effect. Mild generalized atrophy, expected for age. Scattered mild periventricular and subcortical white matter hypodensities are nonspecific, but favored to reflect chronic microvascular ischemic changes. Vascular: Atherosclerotic vascular calcification of the carotid siphons. No hyperdense vessel. Skull: Normal. Negative for fracture or focal lesion. Sinuses/Orbits: No acute finding. Other: None. IMPRESSION: 1.  No acute intracranial abnormality. 2. Mild atrophy and chronic microvascular ischemic changes. Electronically Signed   By: Titus Dubin M.D.   On: 03/24/2019 14:29  Mr Brain Wo Contrast  Result Date: 03/24/2019 CLINICAL DATA:  Acute presentation with altered level of consciousness and confusion. EXAM: MRI HEAD WITHOUT CONTRAST TECHNIQUE: Multiplanar, multiecho pulse sequences of the brain and surrounding structures were obtained without intravenous contrast. COMPARISON:  Head CT earlier same day FINDINGS: Brain: Diffusion imaging shows a small focus restricted diffusion in the right thalamus consistent with acute infarction. No other acute insult. There are mild chronic small-vessel ischemic changes of the pons. No focal cerebellar abnormality. Cerebral hemispheres show moderate changes of chronic small vessel disease affecting the deep and subcortical white matter. No cortical or large vessel territory infarction. No mass lesion, hemorrhage,  hydrocephalus or extra-axial collection. Vascular: Major vessels at the base of the brain show flow. Skull and upper cervical spine: Negative Sinuses/Orbits: Clear/normal Other: None IMPRESSION: Small area of acute infarction suspected in the right thalamus. No swelling or mass effect. Chronic small-vessel ischemic changes elsewhere throughout the brain as outlined above. Electronically Signed   By: Nelson Chimes M.D.   On: 03/24/2019 16:07   Ct Abdomen Pelvis W Contrast  Result Date: 03/24/2019 CLINICAL DATA:  Acute generalized abdominal pain. History of IBS and diverticulosis. EXAM: CT ABDOMEN AND PELVIS WITH CONTRAST TECHNIQUE: Multidetector CT imaging of the abdomen and pelvis was performed using the standard protocol following bolus administration of intravenous contrast. CONTRAST:  12m OMNIPAQUE IOHEXOL 300 MG/ML  SOLN COMPARISON:  CT dated September 03, 2016. FINDINGS: Lower chest: No acute abnormality. Hepatobiliary: No focal liver abnormality is seen. Status post cholecystectomy. No biliary dilatation. Pancreas: Unremarkable. No pancreatic ductal dilatation or surrounding inflammatory changes. Spleen: Normal in size without focal abnormality. Adrenals/Urinary Tract: Adrenal glands are unremarkable. Kidneys are normal, without renal calculi, focal lesion, or hydronephrosis. Bladder is unremarkable. Stomach/Bowel: There is scattered colonic diverticula without CT evidence of diverticulitis. There is a moderate amount of stool in the colon. The appendix is normal. There is a small hiatal hernia. The stomach is otherwise unremarkable. Vascular/Lymphatic: Aortic atherosclerosis. No enlarged abdominal or pelvic lymph nodes. Reproductive: Status post hysterectomy. No adnexal masses. Other: No abdominal wall hernia or abnormality. No abdominopelvic ascites. A rectocele and cystocele are noted. Musculoskeletal: Advanced degenerative changes are noted throughout the visualized lumbar spine. There is no displaced  fracture. No dislocation. IMPRESSION: 1. No acute intra-abdominal abnormality detected. 2. Chronic changes as above including a rectocele and cystocele. Electronically Signed   By: CConstance HolsterM.D.   On: 03/24/2019 14:33   Dg Chest Portable 1 View  Result Date: 03/24/2019 CLINICAL DATA:  Altered mental status and confusion EXAM: PORTABLE CHEST 1 VIEW COMPARISON:  10/14/2018 and prior radiographs FINDINGS: The cardiomediastinal silhouette is unremarkable. There is no evidence of focal airspace disease, pulmonary edema, suspicious pulmonary nodule/mass, pleural effusion, or pneumothorax. No acute bony abnormalities are identified. IMPRESSION: No active disease. Electronically Signed   By: JMargarette CanadaM.D.   On: 03/24/2019 12:23   Dg Hips Bilat W Or Wo Pelvis 2 Views  Result Date: 03/24/2019 CLINICAL DATA:  Hip pain. Bilateral hip pain. Right is more severe than left. EXAM: DG HIP (WITH OR WITHOUT PELVIS) 2V BILAT COMPARISON:  None. FINDINGS: There is osteopenia which limits detection of nondisplaced fractures. 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 There is mild-to-moderate left-sided osteoarthritis. Evaluation of the right hip is somewhat limited by suboptimal patient positioning. IMPRESSION: No acute osseous abnormality. Electronically Signed   By: CConstance HolsterM.D.   On: 03/24/2019 13:58    DOrson Eva DO  [facility] Pager [phone]  If 7PM-7AM, please contact night-coverage [email or web address] Password [email or web address] 03/25/2019, 7:54 AM   LOS: 1 day

## 2019-03-25 NOTE — Progress Notes (Signed)
Neurology notified of patient arrival from [facility]. Now test result egative for COVID but transferred for being PUI at the time of transfer. Seen by teleneurology and recs provided. Stroke team will follow.  -- Amie Portland, MD [facility] Pager: [phone] If 7pm to 7am, please call on call as listed on AMION.

## 2019-03-25 NOTE — Evaluation (Signed)
Physical Therapy Evaluation Patient Details Name: Theresa Watson MRN: [id number] DOB: 1933-06-27 Today's Date: 03/25/2019   History of Present Illness  Theresa Watson is an 85 y.o. female with past medical history significant for hypertension, hyperlipidemia and diet-controlled diabetes who was in her usual state of excellent health until this morning when she was noted to be altered and confused.  At baseline patient is very functional in fact she killed a copperhead in her yard last week.  She has no mental limitations in her physical limitations are only her knee pain.  This morning she did not answer the phone when her neighbor called and when they were able to get to her she apparently was wandering about her kitchen not knowing how to make coffee which is extremely unusual for her.  Patient was brought to her granddaughter's house, she was able to walk to the car and follow directions.  From her grand daughter's house patient was brought to the ED again but via private vehicle, patient was cooperative.  In the ED patient was apparently following commands but was agitated and moaning and was intermittently clutching her abdomen or her legs or her hip but was not able to vocalize the cause of her distress, she apparently denied pain.    Clinical Impression  Patient limited for functional mobility as stated below secondary to BLE weakness, fatigue and poor standing balance.  Patient demonstrates slow labored movement for sitting up at bedside, sit to stands, transfers and ambulation in room/hallways.  Patient transferred to [facility] and able to wipe self with assistance to stand and tolerated staying up in chair after therapy - RN aware.  Patient will benefit from continued physical therapy in hospital and recommended venue below to increase strength, balance, endurance for safe ADLs and gait.     Follow Up Recommendations SNF;Supervision for mobility/OOB;Supervision - Intermittent    Equipment  Recommendations  None recommended by PT    Recommendations for Other Services       Precautions / Restrictions Precautions Precautions: Fall Restrictions Weight Bearing Restrictions: No      Mobility  Bed Mobility Overal bed mobility: Needs Assistance Bed Mobility: Supine to Sit     Supine to sit: Min guard     General bed mobility comments: increased time, labored movement  Transfers Overall transfer level: Needs assistance Equipment used: 1 person hand held assist;Rolling walker (2 wheeled) Transfers: Sit to/from [facility] Sit to Stand: Min guard;Min assist Stand pivot transfers: Min guard;Min assist       General transfer comment: labored movement, increased time  Ambulation/Gait Ambulation/Gait assistance: Min guard;Min assist Gait Distance (Feet): 45 Feet Assistive device: Rolling walker (2 wheeled) Gait Pattern/deviations: Decreased step length - right;Decreased step length - left;Decreased stride length Gait velocity: decreased   General Gait Details: slightly labored slow cadence without loss of balance, occasional bumping into nearby objects, limited mostly due to c/o fatigue  Stairs            Wheelchair Mobility    Modified Rankin (Stroke Patients Only)       Balance Overall balance assessment: Needs assistance Sitting-balance support: Feet supported;No upper extremity supported Sitting balance-Leahy Scale: Fair Sitting balance - Comments: when seated at bedside, tends to lean forward possilby due to bed too high, safer sitting unsupported in chair   Standing balance support: During functional activity;Single extremity supported Standing balance-Leahy Scale: Poor Standing balance comment: fair/poor with 1 handed held assisted, fair using RW  Pertinent Vitals/Pain Pain Assessment: No/denies pain    Home Living Family/patient expects to be discharged to:: Private  residence Living Arrangements: Alone Available Help at Discharge: Family;Available PRN/intermittently Type of Home: House Home Access: Stairs to enter Entrance Stairs-Rails: Left Entrance Stairs-Number of Steps: 2 Home Layout: One level Home Equipment: Shower seat - built in;Walker - 2 wheels;Cane - single point      Prior Function Level of Independence: Independent with assistive device(s)         Comments: household and short distanced community ambulator using [facility]     Hand Dominance        Extremity/Trunk Assessment   Upper Extremity Assessment Upper Extremity Assessment: Defer to OT evaluation    Lower Extremity Assessment Lower Extremity Assessment: Generalized weakness    Cervical / Trunk Assessment Cervical / Trunk Assessment: Normal  Communication   Communication: No difficulties  Cognition Arousal/Alertness: Awake/alert Behavior During Therapy: WFL for tasks assessed/performed Overall Cognitive Status: Within Functional Limits for tasks assessed                                        General Comments      Exercises     Assessment/Plan    PT Assessment Patient needs continued PT services  PT Problem List Decreased strength;Decreased activity tolerance;Decreased balance;Decreased mobility       PT Treatment Interventions Gait training;Stair training;Functional mobility training;Therapeutic activities;Balance training;Therapeutic exercise;Patient/family education    PT Goals (Current goals can be found in the Care Plan section)  Acute Rehab PT Goals Patient Stated Goal: return home with family to assist PT Goal Formulation: With patient Time For Goal Achievement: 04/08/19 Potential to Achieve Goals: Good    Frequency 7X/week   Barriers to discharge        Co-evaluation               AM-PAC PT "6 Clicks" Mobility  Outcome Measure Help needed turning from your back to your side while in a flat bed without using  bedrails?: None Help needed moving from lying on your back to sitting on the side of a flat bed without using bedrails?: A Little Help needed moving to and from a bed to a chair (including a wheelchair)?: A Little Help needed standing up from a chair using your arms (e.g., wheelchair or bedside chair)?: A Little Help needed to walk in hospital room?: A Lot Help needed climbing 3-5 steps with a railing? : A Lot 6 Click Score: 17    End of Session   Activity Tolerance: Patient tolerated treatment well;Patient limited by fatigue Patient left: in chair;with call bell/phone within reach Nurse Communication: Mobility status PT Visit Diagnosis: Unsteadiness on feet (R26.81);Other abnormalities of gait and mobility (R26.89);Muscle weakness (generalized) (M62.81)    Time: 0539-7673 PT Time Calculation (min) (ACUTE ONLY): 37 min   Charges:   PT Evaluation $PT Eval Moderate Complexity: 1 Mod PT Treatments $Therapeutic Activity: 23-37 mins        10:54 AM, 03/25/19 Lonell Grandchild, MPT Physical Therapist with [facility] [phone] office [phone] mobile phone

## 2019-03-25 NOTE — Progress Notes (Addendum)
  Echocardiogram 2D Echocardiogram has been performed.  Patient refused complete echo due to sensivity to probe.  Theresa Watson L Androw 03/25/2019, 11:47 AM

## 2019-03-25 NOTE — Progress Notes (Signed)
Pt not oriented to time and place, but able to say she is in [place]. Bed alarm triggered; pt was found attempting to exit bed and move things on table. Pt unable to express why she needed to exit the bed. Bed alarm put on more sensitive setting;  pt restated need to use call bell to exit bed; bed low & locked, call bell and phone accessible to patient, pt reoriented, low stimulation environment

## 2019-03-25 NOTE — Progress Notes (Signed)
SLP Cancellation Note  Patient Details Name: Theresa Watson MRN: [id number] DOB: [birth year]   Cancelled treatment:       Reason Eval/Treat Not Completed: Patient at procedure or test/unavailable; SLE ordered, however Pt in room for procedure. SLP will check back as schedule permits, however Pt will likely transfer to [facility]. Pt passed [facility] swallow screen.   Thank you,  Genene Churn, [place]    [place] 03/25/2019, 12:59 PM

## 2019-03-25 NOTE — Plan of Care (Signed)
  Problem: Acute Rehab PT Goals(only PT should resolve) Goal: Pt Will Go Supine/Side To Sit Outcome: Progressing Flowsheets (Taken 03/25/2019 1057) Pt will go Supine/Side to Sit: with modified independence Goal: Patient Will Transfer Sit To/From Stand Outcome: Progressing Flowsheets (Taken 03/25/2019 1057) Patient will transfer sit to/from stand: with min guard assist Goal: Pt Will Transfer Bed To Chair/Chair To Bed Outcome: Progressing Flowsheets (Taken 03/25/2019 1057) Pt will Transfer Bed to Chair/Chair to Bed: min guard assist Goal: Pt Will Ambulate Outcome: Progressing Flowsheets (Taken 03/25/2019 1057) Pt will Ambulate:  75 feet  with min guard assist  with rolling walker  with cane   10:58 AM, 03/25/19 Lonell Grandchild, MPT Physical Therapist with [facility] [phone] office [phone] mobile phone

## 2019-03-25 NOTE — Evaluation (Signed)
Speech Language Pathology Evaluation Patient Details Name: Theresa Watson MRN: [id number] DOB: [birth year] Today's Date: 03/25/2019 Time: 5681-2751 SLP Time Calculation (min) (ACUTE ONLY): 20 min  Problem List:  Patient Active Problem List   Diagnosis Date Noted  . Acute ischemic stroke ([place]) 03/25/2019  . Acute metabolic encephalopathy 70/10/7492  . Hyponatremia 03/24/2019  . Diabetes mellitus due to underlying condition with diabetic autonomic neuropathy, without long-term current use of insulin ([place]) 09/18/2015  . Annual physical exam 02/26/2015  . Type 2 diabetes mellitus with neurological manifestations, controlled ([place]) 08/29/2013  . Hypertension 11/13/2012  . Cataracts, bilateral 11/13/2012  . Symptomatic menopausal or female climacteric states   . Hyperlipidemia associated with type 2 diabetes mellitus ([place])   . Esophagitis   . 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 Obesity, mild   . IBS (irritable bowel syndrome)   . Kidney stone   . Lumbosacral spondylosis without myelopathy   . Pain in joint, shoulder region   . Colon polyp   . Diverticulosis    Past Medical History:  Past Medical History:  Diagnosis Date  . Colon polyp   . Diabetes mellitus without complication ([place])   . Diverticulosis   . Esophagitis   . IBS (irritable bowel syndrome)   . Kidney stone    hx of 1 kidney stone  . Lumbosacral spondylosis without myelopathy   . Obesity, mild   . Other and unspecified hyperlipidemia   . Pain in joint, shoulder region   . 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 Symptomatic menopausal or female climacteric states    Past Surgical History:  Past Surgical History:  Procedure Laterality Date  . CARPAL TUNNEL RELEASE    . CHOLECYSTECTOMY    . EYE SURGERY Bilateral    cataracts  . lumbar back surgery    . VAGINAL HYSTERECTOMY     partial   HPI:  85 year old female with a history of hypertension,  hyperlipidemia, diabetes mellitus presenting with altered mental status.  The patient at baseline is highly functional and able to perform all her ADLs without assistance.  The patient was last seen normal in the afternoon of 03/23/2019.  In the morning of 03/24/2019, the patient did not answer her phone when she was called.  A neighbor went to check up on her and noted the patient to be confused and wandering with word finding difficulties.  MRI of the brain showed a small acute right thalamic stroke. Since admission, the patient developed a fever of 101.1 F.  The patient has leukopenia and lymphopenia and thrombocytopenia.  Repeat COVID testing has been ordered as pt is PUI.  The patient herself denies any headache, neck pain, chest pain, shortness breath, coughing, nausea, vomiting, diarrhea, abdominal pain, dysuria.  There is no anosmia.  She denies any visual disturbance or focal extremity weakness.  The patient was transferred to [facility] as a PUI and for neurology eval.   Assessment / Plan / Recommendation Clinical Impression  Pt presents with word finding errors as evidenced by decreased confrontational naming ability and word finding deficits wihtin basic conversation. SLP entered room, pt was talking on phone with her "best friend." Pt answered mostly with yes/no. SLP spoke with this friend who did endorse complete independence prior to admission as well as word finding deficits within this conversation. Nursing also endorses word finding deficits. Pt's cognitive abilities appear intact and as such she was able to complete sentences  or choose between two verbal choices during moments of word finding difficulty. Pt is able to communicate wants and needs between nonverbal and verbal ability. It should be noted that pt is currently in negative pressure room with SLP wearing mask. It is likely that both interfered to some extent with evaluation. ST to follow for word finding deficits but pt is able to  communicate wants and needs.     SLP Assessment  SLP Recommendation/Assessment: Patient needs continued Speech Lanaguage Pathology Services SLP Visit Diagnosis: Aphasia (R47.01)    Follow Up Recommendations  Home health SLP    Frequency and Duration min 2x/week  2 weeks      SLP Evaluation Cognition  Overall Cognitive Status: Within Functional Limits for tasks assessed Arousal/Alertness: Awake/alert Orientation Level: Oriented X4(with yes/no questions d/t word finding difficulties)       Comprehension  Auditory Comprehension Overall Auditory Comprehension: Appears within functional limits for tasks assessed Yes/No Questions: Within Functional Limits Visual Recognition/Discrimination Discrimination: Not tested Reading Comprehension Reading Status: Not tested    Expression Expression Primary Mode of Expression: Verbal Verbal Expression Overall Verbal Expression: Impaired Initiation: No impairment Automatic Speech: Name;Day of week;Month of year;Social Response Level of Generative/Spontaneous Verbalization: Sentence Repetition: No impairment Naming: Impairment Responsive: Not tested Confrontation: Impaired Convergent: Not tested Divergent: Not tested Verbal Errors: Aware of errors Pragmatics: No impairment Effective Techniques: Open ended questions;Sentence completion Non-Verbal Means of Communication: Not applicable Written Expression Dominant Hand: Right Written Expression: Not tested   Oral / Motor  Oral Motor/Sensory Function Overall Oral Motor/Sensory Function: Within functional limits Motor Speech Overall Motor Speech: Appears within functional limits for tasks assessed Respiration: Within functional limits Phonation: Normal Resonance: Within functional limits Articulation: Within functional limitis Intelligibility: Intelligible Motor Planning: Witnin functional limits Motor Speech Errors: Not applicable   GO                    Eladia Frame 03/25/2019, 4:16 PM

## 2019-03-26 DIAGNOSIS — R410 Disorientation, unspecified: Secondary | ICD-10-CM

## 2019-03-26 DIAGNOSIS — R1084 Generalized abdominal pain: Secondary | ICD-10-CM

## 2019-03-26 DIAGNOSIS — E0843 Diabetes mellitus due to underlying condition with diabetic autonomic (poly)neuropathy: Secondary | ICD-10-CM

## 2019-03-26 LAB — COMPREHENSIVE METABOLIC PANEL
ALT: 37 U/L (ref 0–44)
AST: 46 U/L — ABNORMAL HIGH (ref 15–41)
Albumin: 3.4 g/dL — ABNORMAL LOW (ref 3.5–5.0)
Alkaline Phosphatase: 59 U/L (ref 38–126)
Anion gap: 12 (ref 5–15)
BUN: 9 mg/dL (ref 8–23)
CO2: 21 mmol/L — ABNORMAL LOW (ref 22–32)
Calcium: 9.5 mg/dL (ref 8.9–10.3)
Chloride: 98 mmol/L (ref 98–111)
Creatinine, Ser: 0.88 mg/dL (ref 0.44–1.00)
GFR calc Af Amer: >60 mL/min (ref >60)
GFR calc non Af Amer: >60 mL/min (ref >60)
Glucose, Bld: 241 mg/dL — ABNORMAL HIGH (ref 70–99)
Potassium: 3 mmol/L — ABNORMAL LOW (ref 3.5–5.1)
Sodium: 131 mmol/L — ABNORMAL LOW (ref 135–145)
Total Bilirubin: 1 mg/dL (ref 0.3–1.2)
Total Protein: 6 g/dL — ABNORMAL LOW (ref 6.5–8.1)

## 2019-03-26 LAB — CBC WITH DIFFERENTIAL/PLATELET
Abs Immature Granulocytes: 0 10*3/uL (ref 0.00–0.07)
Basophils Absolute: 0 10*3/uL (ref 0.0–0.1)
Basophils Relative: 0 %
Eosinophils Absolute: 0 10*3/uL (ref 0.0–0.5)
Eosinophils Relative: 1 %
HCT: 34.1 % — ABNORMAL LOW (ref 36.0–46.0)
Hemoglobin: 12 g/dL (ref 12.0–15.0)
Lymphocytes Relative: 44 %
Lymphs Abs: 1.6 10*3/uL (ref 0.7–4.0)
MCH: 31.3 pg (ref 26.0–34.0)
MCHC: 35.2 g/dL (ref 30.0–36.0)
MCV: 88.8 fL (ref 80.0–100.0)
Metamyelocytes Relative: 1 %
Monocytes Absolute: 0.3 10*3/uL (ref 0.1–1.0)
Monocytes Relative: 8 %
Neutro Abs: 1.6 10*3/uL — ABNORMAL LOW (ref 1.7–7.7)
Neutrophils Relative %: 44 %
Other: 2 %
Platelets: 131 10*3/uL — ABNORMAL LOW (ref 150–400)
RBC: 3.84 MIL/uL — ABNORMAL LOW (ref 3.87–5.11)
RDW: 12.4 % (ref 11.5–15.5)
WBC Morphology: ABNORMAL
WBC: 3.6 10*3/uL — ABNORMAL LOW (ref 4.0–10.5)
nRBC: 0 % (ref 0.0–0.2)

## 2019-03-26 LAB — GLUCOSE, CAPILLARY
Glucose-Capillary: 136 mg/dL — ABNORMAL HIGH (ref 70–99)
Glucose-Capillary: 161 mg/dL — ABNORMAL HIGH (ref 70–99)
Glucose-Capillary: 149 mg/dL — ABNORMAL HIGH (ref 70–99)
Glucose-Capillary: 149 mg/dL — ABNORMAL HIGH (ref 70–99)

## 2019-03-26 NOTE — Progress Notes (Addendum)
Provided update to daughter [name] at [phone]. She has concern for insurance coverage of a second MRI. Will follow-up.  Pt has orders for TCD and u/s of carotids. No order for MRI seen.

## 2019-03-26 NOTE — Progress Notes (Signed)
PROGRESS NOTE    GERRY HEAPHY  DGU:[id number] DOB: [birth year] DOA: 03/24/2019 PCP: [name], MD    Brief Narrative:  85 year old female with a history of hypertension, hyperlipidemia, diabetes mellitus presenting with altered mental status.  The patient at baseline is highly functional and able to perform all her ADLs without assistance.  The patient was last seen normal in the afternoon of 03/23/2019.  In the morning of 03/24/2019, the patient did not answer her phone when she was called.  A neighbor went to check up on her and noted the patient to be confused and wandering with word finding difficulties.  MRI of the brain showed a small acute right thalamic stroke.  As result, the patient was brought to the hospital for further evaluation.  Since admission, the patient developed a fever of 101.1 F.  The patient has leukopenia and lymphopenia and thrombocytopenia.  Repeat COVID testing has been ordered as pt is PUI.  The patient herself denies any headache, neck pain, chest pain, shortness breath, coughing, nausea, vomiting, diarrhea, abdominal pain, dysuria.  There is no anosmia.  She denies any visual disturbance or focal extremity weakness.  The patient is being transferred to [facility] as a PUI and for neurology eval.  Assessment & Plan:   Principal Problem:   Acute metabolic encephalopathy Active Problems:   IBS (irritable bowel syndrome)   Hypertension   Diabetes mellitus due to underlying condition with diabetic autonomic neuropathy, without long-term current use of insulin ([facility])   Hyponatremia   Acute ischemic stroke [facility])  Acute right thalamic stroke -Neurology Consulted and is following. Discussed case with Dr. Leonie Man -Therapy recs for SNF -CT brain noted to be neg -MRI brain--acute right thalamic stroke seen, personally reviewed -Carotid Duplex performed, pending results -Echo- reviewed, unremarkable with normal LVEF -LDL-60 -HbA1C-- -Antiplatelet--ASA 81 mg   Fever/leukopenia/thrombocytopenia -she is currently being treated as a PUI for COVID-19 -CEPHEID test neg -Ferritin 1,064 -CRP 8.1 -Procalcitonin of 0.18 -Check LDH, ESR, d-dimer, CK -blood cultures x 2 sets  Hyperlipidemia -Continue statin -Stable at this time  Essential hypertension -Held Maxide to allow for permissive hypertension -BP stable at this time  Diabetes Mellitus type 2 -Hemoglobin A1c of 7.2 -Cont with SSI coverage as tolerated  DVT prophylaxis: Lovenox subQ Code Status: Full Family Communication: Pt in room, family not at bedside Disposition Plan: Uncertain at this time  Consultants:   Neurology  Procedures:     Antimicrobials: Anti-infectives (From admission, onward)   None       Subjective: Without complaints at this time  Objective: Vitals:   03/25/19 2340 03/26/19 0608 03/26/19 0750 03/26/19 1124  BP: (!) 127/59 120/68 (!) 108/59 (!) 122/58  Pulse: (!) 113 92 85 90  Resp: _0 Temp: (!) 97.5 F (36.4 C) 99.3 F (37.4 C) 98.2 F (36.8 C) 98.7 F (37.1 C)  TempSrc: Oral Oral Oral Oral  SpO2: 96% 95% 96% 95%  Weight:      Height:        Intake/Output Summary (Last 24 hours) at 03/26/2019 1618 Last data filed at 03/26/2019 1230 Gross per 24 hour  Intake 720 ml  Output 400 ml  Net 320 ml   Filed Weights   03/24/19 1835 03/25/19 0300 03/25/19 1440  Weight: 68.8 kg 72.4 kg 69.3 kg    Examination:  General exam: Appears calm and comfortable  Respiratory system: Clear to auscultation. Respiratory effort normal. Cardiovascular system: S1 & S2 heard, RRR Gastrointestinal  system: Abdomen is nondistended, soft and nontender. No organomegaly or masses felt. Normal bowel sounds heard. Central nervous system: Alert and oriented. No focal neurological deficits. Extremities: Symmetric 5 x 5 power. Skin: No rashes, lesions Psychiatry: Judgement and insight appear normal. Mood & affect appropriate.   Data Reviewed: I have  personally reviewed following labs and imaging studies  CBC: Recent Labs  Lab 03/24/19 1144 03/25/19 0847 03/26/19 0846  WBC 3.0* 1.9* 3.6*  NEUTROABS 2.0 1.1* 1.6*  HGB 12.4 11.7* 12.0  HCT 35.8* 34.8* 34.1*  MCV 90.9 93.0 88.8  PLT 152 137* 226*   Basic Metabolic Panel: Recent Labs  Lab 03/24/19 1144 03/26/19 0846  NA 130* 131*  K 3.4* 3.0*  CL 94* 98  CO2 25 21*  GLUCOSE 152* 241*  BUN 14 9  CREATININE 0.82 0.88  CALCIUM 9.7 9.5   GFR: Estimated Creatinine Clearance: 44.2 mL/min (by C-G formula based on SCr of 0.88 mg/dL). Liver Function Tests: Recent Labs  Lab 03/24/19 1144 03/26/19 0846  AST 35 46*  ALT 37 37  ALKPHOS 64 59  BILITOT 1.0 1.0  PROT 6.6 6.0*  ALBUMIN 3.9 3.4*   No results for input(s): LIPASE, AMYLASE in the last 168 hours. No results for input(s): AMMONIA in the last 168 hours. Coagulation Profile: Recent Labs  Lab 03/24/19 1145  INR 1.0   Cardiac Enzymes: Recent Labs  Lab 03/25/19 0847  CKTOTAL 234   BNP (last 3 results) No results for input(s): PROBNP in the last 8760 hours. HbA1C: Recent Labs    03/24/19 1144  HGBA1C 7.2*   CBG: Recent Labs  Lab 03/25/19 1103 03/25/19 1735 03/25/19 2228 03/26/19 0747 03/26/19 1226  GLUCAP 144* 93 167* 136* 161*   Lipid Profile: Recent Labs    03/25/19 0439  CHOL 131  HDL 24*  LDLCALC 60  TRIG 233*  CHOLHDL 5.5   Thyroid Function Tests: No results for input(s): TSH, T4TOTAL, FREET4, T3FREE, THYROIDAB in the last 72 hours. Anemia Panel: Recent Labs    03/24/19 1155 03/25/19 0847  VITAMINB12  --  950*  FERRITIN 717* 1,064*   Sepsis Labs: Recent Labs  Lab 03/24/19 1155 03/24/19 1215 03/24/19 1414 03/25/19 0847  PROCALCITON 0.16  --   --  0.18  LATICACIDVEN  --  1.2 1.0  --     Recent Results (from the past 240 hour(s))  Novel Coronavirus,NAA,(SEND-OUT TO REF LAB - TAT 24-48 hrs); Hosp Order     Status: None   Collection Time: 03/24/19 11:51 AM   Specimen:  Nasopharyngeal Swab; Respiratory  Result Value Ref Range Status   SARS-CoV-2, NAA NOT DETECTED NOT DETECTED Final    Comment: (NOTE) This test was developed and its performance characteristics determined by [employer]. This test has not been FDA cleared or approved. This test has been authorized by FDA under an Emergency Use Authorization (EUA). This test is only authorized for the duration of time the declaration that circumstances exist justifying the authorization of the emergency use of in vitro diagnostic tests for detection of SARS-CoV-2 virus and/or diagnosis of COVID-19 infection under section 564(b)(1) of the Act, 21 U.S.C. 333LKT-6(Y)(5), unless the authorization is terminated or revoked sooner. When diagnostic testing is negative, the possibility of a false negative result should be considered in the context of a patient's recent exposures and the presence of clinical signs and symptoms consistent with COVID-19. An individual without symptoms of COVID-19 and who is not shedding SARS-CoV-2 virus would expect to have  a negative (not detected) result in this assay. Performed  At: [facility] [address] [place], [place] [id number] Rush Farmer MD YB:[id number]    [place]  Final    Comment: Performed at [facility], [address], [place], [place] [address]  MRSA PCR Screening     Status: None   Collection Time: 03/24/19  5:32 PM   Specimen: Nasal Mucosa; Nasopharyngeal  Result Value Ref Range Status   MRSA by PCR NEGATIVE NEGATIVE Final    Comment:        The GeneXpert MRSA Assay (FDA approved for NASAL specimens only), is one component of a comprehensive MRSA colonization surveillance program. It is not intended to diagnose MRSA infection nor to guide or monitor treatment for MRSA infections. Performed at [facility], [address], [place], [place] [address]   SARS Coronavirus 2 ([place] - Performed in [facility] hospital lab), Hosp Order     Status: None   Collection Time: 03/25/19  7:30 AM   Specimen: Nasopharyngeal Swab  Result Value Ref Range Status   SARS Coronavirus 2 NEGATIVE NEGATIVE Final    Comment: (NOTE) If result is NEGATIVE SARS-CoV-2 target nucleic acids are NOT DETECTED. The SARS-CoV-2 RNA is generally detectable in upper and lower  respiratory specimens during the acute phase of infection. The lowest  concentration of SARS-CoV-2 viral copies this assay can detect is 250  copies / mL. A negative result does not preclude SARS-CoV-2 infection  and should not be used as the sole basis for treatment or other  patient management decisions.  A negative result may occur with  improper specimen collection / handling, submission of specimen other  than nasopharyngeal swab, presence of viral mutation(s) within the  areas targeted by this assay, and inadequate number of viral copies  (<250 copies / mL). A negative result must be combined with clinical  observations, patient history, and epidemiological information. If result is POSITIVE SARS-CoV-2 target nucleic acids are DETECTED. The SARS-CoV-2 RNA is generally detectable in upper and lower  respiratory specimens dur ing the acute phase of infection.  Positive  results are indicative of active infection with SARS-CoV-2.  Clinical  correlation with patient history and other diagnostic information is  necessary to determine patient infection status.  Positive results do  not rule out bacterial infection or co-infection with other viruses. If result is PRESUMPTIVE POSTIVE SARS-CoV-2 nucleic acids MAY BE PRESENT.   A presumptive positive result was obtained on the submitted specimen  and confirmed on repeat testing.  While 2019 novel coronavirus  (SARS-CoV-2) nucleic acids may be present in the submitted sample  additional confirmatory testing may be necessary for epidemiological  and / or clinical management purposes  to  differentiate between  SARS-CoV-2 and other Sarbecovirus currently known to infect humans.  If clinically indicated additional testing with an alternate test  methodology [phone]) is advised. The SARS-CoV-2 RNA is generally  detectable in upper and lower respiratory sp ecimens during the acute  phase of infection. The expected result is Negative. Fact Sheet for Patients:  StrictlyIdeas.no Fact Sheet for Healthcare Providers: BankingDealers.co.za This test is not yet approved or cleared by the [place] FDA and has been authorized for detection and/or diagnosis of SARS-CoV-2 by FDA under an Emergency Use Authorization (EUA).  This EUA will remain in effect (meaning this test can be used) for the duration of the COVID-19 declaration under Section 564(b)(1) of the Act, 21 U.S.C. section 360bbb-3(b)(1), unless the authorization is  terminated or revoked sooner. Performed at [facility], [address] [place], [place] [address]   Culture, blood (Routine X 2) w Reflex to ID Panel     Status: None (Preliminary result)   Collection Time: 03/25/19  8:46 AM   Specimen: BLOOD LEFT ARM  Result Value Ref Range Status   Specimen Description BLOOD LEFT ARM  Final   Special Requests   Final    BOTTLES DRAWN AEROBIC AND ANAEROBIC Blood Culture adequate volume   Culture   Final    NO GROWTH < 24 HOURS Performed at [facility], [address], [place], [place] [address]    Report Status PENDING  Incomplete  Culture, blood (Routine X 2) w Reflex to ID Panel     Status: None (Preliminary result)   Collection Time: 03/25/19  8:52 AM   Specimen: BLOOD LEFT ARM  Result Value Ref Range Status   Specimen Description BLOOD LEFT ARM  Final   Special Requests   Final    BOTTLES DRAWN AEROBIC AND ANAEROBIC Blood Culture adequate volume   Culture   Final    NO GROWTH < 24 HOURS Performed at [facility], [address] [place], [place] [address]     Report Status PENDING  Incomplete  SARS Coronavirus 2     Status: None   Collection Time: 03/25/19  9:09 PM  Result Value Ref Range Status   SARS Coronavirus 2 NOT DETECTED NOT DETECTED Final    Comment: (NOTE) SARS-CoV-2 target nucleic acids are NOT DETECTED. The SARS-CoV-2 RNA is generally detectable in upper and lower respiratory specimens during the acute phase of infection.  Negative  results do not preclude SARS-CoV-2 infection, do not rule out co-infections with other pathogens, and should not be used as the sole basis for treatment or other patient management decisions.  Negative results must be combined with clinical observations, patient history, and epidemiological information. The expected result is Not Detected. Fact Sheet for Patients: http://www.biofiredefense.com/wp-content/uploads/2020/03/BIOFIRE-COVID -19-patients.pdf Fact Sheet for Healthcare Providers: http://www.biofiredefense.com/wp-content/uploads/2020/03/BIOFIRE-COVID -19-hcp.pdf This test is not yet approved or cleared by the [place] and  has been authorized for detection and/or diagnosis of SARS-CoV-2 by FDA under an Emergency Use Authorization (EUA).  This EUA will remain in effec t (meaning this test can be used) for the duration of  the COVID-19 declaration under Section 564(b)(1) of the Act, 21 U.S.C. section 360bbb-3(b)(1), unless the authorization is terminated or revoked sooner. Performed at [facility], [place] [address] [place], [place] [address]      Radiology Studies: No results found.  Scheduled Meds: . aspirin EC  81 mg Oral Daily  . enoxaparin (LOVENOX) injection  40 mg Subcutaneous Q24H  . insulin aspart  0-9 Units Subcutaneous TID WC  . pravastatin  80 mg Oral q1800  . sodium chloride flush  3 mL Intravenous Once   Continuous Infusions:   LOS: 2 days   Marylu Lund, MD [facility] Pager On Amion  If 7PM-7AM, please contact night-coverage 03/26/2019,  4:18 PM

## 2019-03-26 NOTE — Progress Notes (Signed)
Pt did well overnight with 1:1 safety observation. Request to oncall provider for telesitter.

## 2019-03-26 NOTE — Progress Notes (Signed)
Pt not oriented to situation, time, intermittently confused. Attempted to leave bed again, pt used BSC, purewick applied, pt had taken PIV out. Pg to on-call for telesitter.

## 2019-03-26 NOTE — Evaluation (Signed)
Occupational Therapy Evaluation Patient Details Name: Theresa Watson MRN: [id number] DOB: June 12, 1933 Today's Date: 03/26/2019    History of Present Illness Pt is an 85 yo female s/p MRI of the brain showed a small acute right thalamic stroke. PMHx: DM, HTN, HLD.   Clinical Impression   Pt PTA: living alone and mostly independent with mobility and ADL. Supportive family. Pt currently limited by decreased safety awareness. Pt follows commands and performs grooming in standing at sink with supervisionA  And set-upA for LB dressing for ADL. Pt ambulating 200' with RW at 105 BPM with exertion. No SOB  Or dizziness. Pt does not require continued OT skilled services. Pt to have assist at home from family. OT signing off.    Follow Up Recommendations  No OT follow up;Supervision/Assistance - 24 hour    Equipment Recommendations  None recommended by OT    Recommendations for Other Services       Precautions / Restrictions Precautions Precautions: Fall Restrictions Weight Bearing Restrictions: No      Mobility Bed Mobility Overal bed mobility: Modified Independent             General bed mobility comments: no physical assist  Transfers Overall transfer level: Needs assistance Equipment used: 1 person hand held assist;Rolling walker (2 wheeled) Transfers: Sit to/from Stand Sit to Stand: Supervision              Balance Overall balance assessment: Needs assistance Sitting-balance support: Feet supported;No upper extremity supported Sitting balance-Leahy Scale: Good     Standing balance support: During functional activity Standing balance-Leahy Scale: Fair                             ADL either performed or assessed with clinical judgement   ADL Overall ADL's : At baseline                                       General ADL Comments: Pt supervision level for safety- at functional baseline     Vision Baseline Vision/History: Wears  glasses Wears Glasses: At all times Vision Assessment?: No apparent visual deficits     Perception     Praxis      Pertinent Vitals/Pain Pain Assessment: No/denies pain     Hand Dominance Right   Extremity/Trunk Assessment Upper Extremity Assessment Upper Extremity Assessment: Overall WFL for tasks assessed   Lower Extremity Assessment Lower Extremity Assessment: Overall WFL for tasks assessed   Cervical / Trunk Assessment Cervical / Trunk Assessment: Normal   Communication Communication Communication: No difficulties   Cognition Arousal/Alertness: Awake/alert Behavior During Therapy: WFL for tasks assessed/performed Overall Cognitive Status: Within Functional Limits for tasks assessed                                     General Comments  VSS. HR up to 105 BPM with activity    Exercises     Shoulder Instructions      Home Living Family/patient expects to be discharged to:: Private residence Living Arrangements: Alone Available Help at Discharge: Family;Available PRN/intermittently Type of Home: House Home Access: Stairs to enter [employer] of Steps: 2 Entrance Stairs-Rails: Left Home Layout: One level               Home  Equipment: Shower seat - built in;Walker - 2 wheels;Cane - single point   Additional Comments: per chart info received.  Lives With: Alone    Prior Functioning/Environment Level of Independence: Independent with assistive device(s)        Comments: household and short distanced community ambulator using [facility]        OT Problem List: Decreased safety awareness      OT Treatment/Interventions:      OT Goals(Current goals can be found in the care plan section) Acute Rehab OT Goals Patient Stated Goal: return home with family to assist OT Goal Formulation: With patient  OT Frequency:     Barriers to D/C:            Co-evaluation              AM-PAC OT "6 Clicks" Daily Activity      Outcome Measure Help from another person eating meals?: None Help from another person taking care of personal grooming?: None Help from another person toileting, which includes using toliet, bedpan, or urinal?: None Help from another person bathing (including washing, rinsing, drying)?: A Little Help from another person to put on and taking off regular upper body clothing?: None Help from another person to put on and taking off regular lower body clothing?: A Little 6 Click Score: 22   End of Session Equipment Utilized During Treatment: Gait belt;Rolling walker Nurse Communication: Mobility status  Activity Tolerance: Patient tolerated treatment well Patient left: in chair;with call bell/phone within reach;with nursing/sitter in room  OT Visit Diagnosis: Unsteadiness on feet (R26.81)                Time: 1441-1500 OT Time Calculation (min): 19 min Charges:  OT General Charges $OT Visit: 1 Visit OT Evaluation $OT Eval Moderate Complexity: 1 Mod  Darryl Nestle) Marsa Aris OTR/L Acute Rehabilitation Services Pager: [phone] Office: [place] 03/26/2019, 3:55 PM

## 2019-03-26 NOTE — Progress Notes (Signed)
Pt w/ telesitter. Multiple calls from telesitter that pt is attempting to leave the bed. Pt unable to be reoriented; pt disoriented x4, trying to "get up to fix me some coffee in my kitchen" and yet states that she knows I'm a nurse and we are in [place], but cannot reorient herself to the place. Pt has no recollection & no idea why she is here and does not know that we are in a hospital in [place] and not her home. On call provider paged for further interventions. NT at the bedside with patient until further guidance is given.

## 2019-03-26 NOTE — Consult Note (Signed)
STROKE TEAM CONSULT NOTE   Referring Physician: Dr Wyline Copas    Reason for Consult: Stroke  HPI: Theresa Watson is an 85 y.o. female with a history of diabetes, diverticulitis, remote hx of tobacco use and obesity who presented to the [facility] emergency department on 03/24/2019 for evaluation of confusion and speech difficulties. The time of onset was unknown. She was found to have these deficits when a neighbor went to check on her after she didn't answer the phone. At [facility] she was seen in consultation by a Tele-Neurologist, Dr.Vajapey, who was concerned about the possibility of a stroke as a cause of her symptoms. An MRI revealed faint DWI changes in the right thalamus. According to the pt's granddaughter the pt also complained of abdominal and chest pain. A CT of the abdomen was performed that showed nothing acute. Arrangements were made to transfer the pt to [facility] for admission and treatment. She was on ASA 81 mg daily and Pravachol 80 daily prior to admission. Patient has remained stable since arrival to [facility].  Confusion seems to be improving.  She does have mild left-sided weakness Date last known well: 03/23/2019 Time last known well: unknown  tPA Given: The patient did not receive TPA as she was outside of the therapeutic treatment window.   Past Medical History Past Medical History:  Diagnosis Date  . Colon polyp   . Diabetes mellitus without complication ([place])   . Diverticulosis   . Esophagitis   . IBS (irritable bowel syndrome)   . Kidney stone    hx of 1 kidney stone  . Lumbosacral spondylosis without myelopathy   . Obesity, mild   . Other and unspecified hyperlipidemia   . Pain in joint, shoulder region   . 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 Symptomatic menopausal or female climacteric states     Surgical History Past Surgical History:  Procedure Laterality Date  . CARPAL TUNNEL RELEASE    . CHOLECYSTECTOMY    . EYE  SURGERY Bilateral    cataracts  . lumbar back surgery    . VAGINAL HYSTERECTOMY     partial    Family History  Family History  Problem Relation Age of Onset  . Cancer Mother        brain  . Brain cancer Mother   . GI Bleed Father   . Arthritis Sister        rheumatoid  . Lymphoma Sister   . Pancreatic cancer Brother   . Diabetes Son   . Arthritis Son        back  . Cancer Son        prostate  . Cancer Maternal Grandfather   . Suicidality Paternal Grandfather   . Arthritis Son        back  . Hyperlipidemia Son   . Colitis Son     Social History:   reports that she quit smoking about 7 years ago. Her smoking use included cigarettes. She started smoking about 65 years ago. She smoked 0.50 packs per day. She has never used smokeless tobacco. She reports that she does not drink alcohol or use drugs.  Allergies:  Allergies  Allergen Reactions  . Levaquin [Levofloxacin] Swelling    tongue  . Cephalexin   . Penicillins     .Did it involve swelling of the face/tongue/throat, SOB, or low BP? Unknown Did it involve sudden or severe rash/hives, skin peeling, or any reaction on  the inside of your mouth or nose? Unknown Did you need to seek medical attention at a hospital or doctor's office? Unknown When did it last happen? If all above answers are "NO", may proceed with cephalosporin use.   Marland Kitchen Lisinopril Cough    Home Medications:  Medications Prior to Admission  Medication Sig Dispense Refill  . ACCU-CHEK AVIVA PLUS test strip TEST TWO TIMES DAILY AS  DIRECTED 100 each 7  . aspirin 81 MG EC tablet Take 81 mg by mouth daily.      . Calcium Carb-Cholecalciferol (CALCIUM 600 + D PO) Take 1 tablet by mouth daily.     . Cholecalciferol (VITAMIN D3) 2000 UNITS TABS Take 1 tablet by mouth daily.      . ferrous sulfate 325 (65 FE) MG EC tablet Take 325 mg by mouth daily with breakfast.      . gabapentin (NEURONTIN) 100 MG capsule Take 1 cap PO in the AM and 3 caps po QHS  360 capsule 3  . Garlic 341 MG TABS Take 1 tablet by mouth 2 (two) times daily.     Marland Kitchen geriatric multivitamins-minerals (ELDERTONIC/GEVRABON) ELIX Take 15 mLs by mouth daily.      . Omega-3 Fatty Acids (FISH OIL) 1000 MG CAPS Take 2 capsules by mouth daily.     Marland Kitchen omeprazole (PRILOSEC) 20 MG capsule Take 20 mg by mouth daily.      . potassium chloride SA (K-DUR,KLOR-CON) 20 MEQ tablet TAKE 1 TABLET BY MOUTH  DAILY 90 tablet 1  . pravastatin (PRAVACHOL) 80 MG tablet TAKE 1 TABLET BY MOUTH  DAILY 90 tablet 1  . triamterene-hydrochlorothiazide (MAXZIDE-25) 37.5-25 MG tablet TAKE 1 TABLET BY MOUTH  DAILY 90 tablet 1  . vitamin C (ASCORBIC ACID) 500 MG tablet Take 500 mg by mouth daily.    Marland Kitchen VITAMIN E PO Take 1 capsule by mouth daily.      Hospital Medications . aspirin EC  81 mg Oral Daily  . enoxaparin (LOVENOX) injection  40 mg Subcutaneous Q24H  . insulin aspart  0-9 Units Subcutaneous TID WC  . pravastatin  80 mg Oral q1800  . sodium chloride flush  3 mL Intravenous Once    ROS: As documented above in history of present illness and all other systems negative History obtained from chart review and the patient      Physical Examination: Vitals:   03/25/19 2100 03/25/19 2340 03/26/19 0608 03/26/19 0750  BP: 137/62 (!) 127/59 120/68 (!) 108/59  Pulse:  (!) 113 92 85  Resp: 18 18 18 19   Temp: (!) 97.5 F (36.4 C) (!) 97.5 F (36.4 C) 99.3 F (37.4 C) 98.2 F (36.8 C)  TempSrc: Oral Oral Oral Oral  SpO2: 94% 96% 95% 96%  Weight:      Height:       Pleasant elderly Caucasian lady not in distress. . Afebrile. Head is nontraumatic. Neck is supple without bruit.    Cardiac exam no murmur or gallop. Lungs are clear to auscultation. Distal pulses are well felt.  Neurological Exam ;  Awake  Alert oriented x 2.  Diminished attention, registration and recall.  Speech is nonfluent and hesitant.  Speaks short sentences only.  Able to follow simple midline and one-step commands.  No  paraphasic errors.  Normal speech and language.eye movements full without nystagmus.fundi were not visualized. Vision acuity and fields appear normal. Hearing is normal. Palatal movements are normal. Face asymmetric with mild left lower facial weakness.. Tongue midline.  Normal strength, tone, reflexes and coordination except mild weakness of left grip and intrinsic hand muscles.  Orbits right over left upper extremity.  Fine finger movements are diminished on the left.. Normal sensation. Gait deferred.   LABORATORY STUDIES:  Basic Metabolic Panel: Recent Labs  Lab 03/24/19 1144 03/26/19 0846  NA 130* 131*  K 3.4* 3.0*  CL 94* 98  CO2 25 21*  GLUCOSE 152* 241*  BUN 14 9  CREATININE 0.82 0.88  CALCIUM 9.7 9.5    Liver Function Tests: Recent Labs  Lab 03/24/19 1144 03/26/19 0846  AST 35 46*  ALT 37 37  ALKPHOS 64 59  BILITOT 1.0 1.0  PROT 6.6 6.0*  ALBUMIN 3.9 3.4*   No results for input(s): LIPASE, AMYLASE in the last 168 hours. No results for input(s): AMMONIA in the last 168 hours.  CBC: Recent Labs  Lab 03/24/19 1144 03/25/19 0847 03/26/19 0846  WBC 3.0* 1.9* 3.6*  NEUTROABS 2.0 1.1* 1.6*  HGB 12.4 11.7* 12.0  HCT 35.8* 34.8* 34.1*  MCV 90.9 93.0 88.8  PLT 152 137* 131*    Cardiac Enzymes: Recent Labs  Lab 03/25/19 0847  CKTOTAL 234    BNP: Invalid input(s): POCBNP  CBG: Recent Labs  Lab 03/25/19 0753 03/25/19 1103 03/25/19 1735 03/25/19 2228 03/26/19 0747  GLUCAP 127* 144* 93 167* 136*    Microbiology:   Coagulation Studies: Recent Labs    03/24/19 1145  LABPROT 12.9  INR 1.0    Urinalysis:  Recent Labs  Lab 03/24/19 1148  COLORURINE YELLOW  LABSPEC 1.023  PHURINE 6.0  GLUCOSEU NEGATIVE  HGBUR NEGATIVE  BILIRUBINUR NEGATIVE  KETONESUR 20*  PROTEINUR NEGATIVE  NITRITE NEGATIVE  LEUKOCYTESUR NEGATIVE    Lipid Panel:     Component Value Date/Time   CHOL 131 03/25/2019 0439   CHOL 176 10/14/2018 1135   CHOL 135  03/16/2013 0902   TRIG 233 (H) 03/25/2019 0439   TRIG 90 09/03/2015 0814   TRIG 135 03/16/2013 0902   HDL 24 (L) 03/25/2019 0439   HDL 45 10/14/2018 1135   HDL 59 09/03/2015 0814   HDL 55 03/16/2013 0902   CHOLHDL 5.5 03/25/2019 0439   VLDL 47 (H) 03/25/2019 0439   LDLCALC 60 03/25/2019 0439   LDLCALC 95 10/14/2018 1135   LDLCALC 50 07/27/2014 1147   LDLCALC 53 03/16/2013 0902    HgbA1C:  Lab Results  Component Value Date   HGBA1C 7.2 (H) 03/24/2019    Urine Drug Screen:      Component Value Date/Time   LABOPIA NONE DETECTED 03/24/2019 1148   COCAINSCRNUR NONE DETECTED 03/24/2019 1148   LABBENZ NONE DETECTED 03/24/2019 1148   AMPHETMU NONE DETECTED 03/24/2019 1148   THCU NONE DETECTED 03/24/2019 1148   LABBARB NONE DETECTED 03/24/2019 1148     Alcohol Level:  Recent Labs  Lab 03/24/19 1145  ETH <10    Miscellaneous labs:  EKG  EKG - ST rate 100 BPM. (See cardiology reading for complete details)    IMAGING:  Ct Head Wo Contrast 03/24/2019 IMPRESSION:  1.  No acute intracranial abnormality.  2. Mild atrophy and chronic microvascular ischemic changes.   Mr Brain Wo Contrast 03/24/2019 IMPRESSION:  Small area of acute infarction suspected in the right thalamus. No swelling or mass effect. Chronic small-vessel ischemic changes elsewhere throughout the brain as outlined above.   Ct Abdomen Pelvis W Contrast 03/24/2019 IMPRESSION:  1. No acute intra-abdominal abnormality detected.  2. Chronic changes as above including a  rectocele and cystocele.   Dg Chest Portable 1 View  03/24/2019 IMPRESSION:  No active disease.   Dg Hips Bilat W Or Wo Pelvis 2 Views 03/24/2019 IMPRESSION:  No acute osseous abnormality.   Transthoracic Echocardiogram 02/22/2019 IMPRESSIONS  1. The left ventricle has hyperdynamic systolic function, with an ejection fraction of >65%. The cavity size was normal. Left ventricular diastolic parameters were normal.  2. The right  ventricle has normal systolic function. The cavity was normal. There is no increase in right ventricular wall thickness.  3. Mild thickening of the mitral valve leaflet.  4. The tricuspid valve is grossly normal.  5. The aortic valve is tricuspid. Mild thickening of the aortic valve.   Bilateral Carotid Dopplers - pending   Transcranial Dopplers - pending    Assessment: 85 y.o. female with a history of diabetes, diverticulitis, remote hx of tobacco use and obesity presented to the [facility] emergency department on 03/24/2019 for evaluation of confusion and speech difficulties. She was seen by Tele-Neurology. An MRI was c/w an infarct in the right thalamus and she was transferred to [facility] for admission and treatment. She was outside the window for TPA therapy.   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  Continue ongoing stroke work-up check echocardiogram, Doppler studies, lipid profile and hemoglobin A1c.  Physical occupational and speech therapy consults.  Mobilize out of bed.  Long discussion with patient and Dr. Sherrian Divers and answered questions. Greater than 50% time during this 60-minute consultation visit was spent on counseling and coordination of care about her lacunar stroke and confusion and discussion about evaluation and treatment and prevention plan and answering questions.  Antony Contras, MD Medical Director [facility] Pager: [phone] 03/26/2019 2:28 PM

## 2019-03-27 LAB — COMPREHENSIVE METABOLIC PANEL
ALT: 38 U/L (ref 0–44)
AST: 43 U/L — ABNORMAL HIGH (ref 15–41)
Albumin: 3.6 g/dL (ref 3.5–5.0)
Alkaline Phosphatase: 62 U/L (ref 38–126)
Anion gap: 12 (ref 5–15)
BUN: 8 mg/dL (ref 8–23)
CO2: 24 mmol/L (ref 22–32)
Calcium: 9.9 mg/dL (ref 8.9–10.3)
Chloride: 95 mmol/L — ABNORMAL LOW (ref 98–111)
Creatinine, Ser: 0.88 mg/dL (ref 0.44–1.00)
GFR calc Af Amer: >60 mL/min (ref >60)
GFR calc non Af Amer: >60 mL/min (ref >60)
Glucose, Bld: 215 mg/dL — ABNORMAL HIGH (ref 70–99)
Potassium: 3.3 mmol/L — ABNORMAL LOW (ref 3.5–5.1)
Sodium: 131 mmol/L — ABNORMAL LOW (ref 135–145)
Total Bilirubin: 1 mg/dL (ref 0.3–1.2)
Total Protein: 6.2 g/dL — ABNORMAL LOW (ref 6.5–8.1)

## 2019-03-27 LAB — GLUCOSE, CAPILLARY
Glucose-Capillary: 137 mg/dL — ABNORMAL HIGH (ref 70–99)
Glucose-Capillary: 157 mg/dL — ABNORMAL HIGH (ref 70–99)
Glucose-Capillary: 141 mg/dL — ABNORMAL HIGH (ref 70–99)
Glucose-Capillary: 145 mg/dL — ABNORMAL HIGH (ref 70–99)

## 2019-03-27 MED ORDER — POTASSIUM CHLORIDE CRYS ER 20 MEQ PO TBCR
40.0000 meq | EXTENDED_RELEASE_TABLET | Freq: Two times a day (BID) | ORAL | Status: AC
  Administered 2019-03-27 (×2): 40 meq via ORAL
  Filled 2019-03-27 (×2): qty 2

## 2019-03-27 MED ORDER — CALCIUM CARBONATE ANTACID 500 MG PO CHEW: 1.0000 | CHEWABLE_TABLET | Freq: Three times a day (TID) | ORAL | Status: DC | PRN

## 2019-03-27 NOTE — Progress Notes (Signed)
PROGRESS NOTE    Theresa Watson  AST:[id number] DOB: 1933-02-22 DOA: 03/24/2019 PCP: [name], MD    Brief Narrative:  85 year old female with a history of hypertension, hyperlipidemia, diabetes mellitus presenting with altered mental status.  The patient at baseline is highly functional and able to perform all her ADLs without assistance.  The patient was last seen normal in the afternoon of 03/23/2019.  In the morning of 03/24/2019, the patient did not answer her phone when she was called.  A neighbor went to check up on her and noted the patient to be confused and wandering with word finding difficulties.  MRI of the brain showed a small acute right thalamic stroke.  As result, the patient was brought to the hospital for further evaluation.  Since admission, the patient developed a fever of 101.1 F.  The patient has leukopenia and lymphopenia and thrombocytopenia.  Repeat COVID testing has been ordered as pt is PUI.  The patient herself denies any headache, neck pain, chest pain, shortness breath, coughing, nausea, vomiting, diarrhea, abdominal pain, dysuria.  There is no anosmia.  She denies any visual disturbance or focal extremity weakness.  The patient is being transferred to [facility] as a PUI and for neurology eval.  Assessment & Plan:   Principal Problem:   Acute metabolic encephalopathy Active Problems:   IBS (irritable bowel syndrome)   Hypertension   Diabetes mellitus due to underlying condition with diabetic autonomic neuropathy, without long-term current use of insulin (HCC)   Hyponatremia   Acute ischemic stroke [facility])  Acute right thalamic stroke -Neurology Consulted and is following. Discussed case with Dr. Leonie Man -Therapy recs for SNF -CT brain noted to be neg -MRI brain--acute right thalamic stroke seen, personally reviewed -Carotid Duplex remains pending -Echo- reviewed, unremarkable with normal LVEF -LDL-60 -HbA1C-- -Antiplatelet--ASA 81 mg -Transcranial  doppler was ordered by Neurology 03/26/19, pending  Fever/leukopenia/thrombocytopenia -she is currently being treated as a PUI for COVID-19 -CEPHEID test neg -Ferritin 1,064 -CRP 8.1 -Procalcitonin of 0.18 -Check LDH, ESR, d-dimer, CK -blood cultures x 2 sets  Hyperlipidemia -Continue statin -Remains stable at this time  Essential hypertension -Held Maxide to allow for permissive hypertension -BP remains stable and controlled  Diabetes Mellitus type 2 -Hemoglobin A1c of 7.2 -Continue with SSI coverage  DVT prophylaxis: Lovenox subQ Code Status: Full Family Communication: Pt in room, family not at bedside Disposition Plan: Uncertain at this time  Consultants:   Neurology  Procedures:     Antimicrobials: Anti-infectives (From admission, onward)   None      Subjective: No complaints at this time   Objective: Vitals:   03/26/19 1124 03/26/19 2053 03/27/19 0754 03/27/19 1702  BP: (!) 122/58 115/62 106/60 (!) 118/59  Pulse: 90 82 95 78  Resp: _0 Temp: 98.7 F (37.1 C) 98.5 F (36.9 C) 97.8 F (36.6 C) 97.8 F (36.6 C)  TempSrc: Oral Oral Oral Oral  SpO2: 95% 96% 95% 96%  Weight:      Height:        Intake/Output Summary (Last 24 hours) at 03/27/2019 1832 Last data filed at 03/27/2019 1330 Gross per 24 hour  Intake 960 ml  Output -  Net 960 ml   Filed Weights   03/24/19 1835 03/25/19 0300 03/25/19 1440  Weight: 68.8 kg 72.4 kg 69.3 kg    Examination: General exam: Awake, laying in bed, in nad Respiratory system: Normal respiratory effort, no wheezing Cardiovascular system: regular rate, s1, s2 Gastrointestinal  system: Soft, nondistended, positive BS Central nervous system: CN2-12 grossly intact, strength intact Extremities: Perfused, no clubbing Skin: Normal skin turgor, no notable skin lesions seen Psychiatry: Mood normal // no visual hallucinations   Data Reviewed: I have personally reviewed following labs and imaging studies   CBC: Recent Labs  Lab 03/24/19 1144 03/25/19 0847 03/26/19 0846  WBC 3.0* 1.9* 3.6*  NEUTROABS 2.0 1.1* 1.6*  HGB 12.4 11.7* 12.0  HCT 35.8* 34.8* 34.1*  MCV 90.9 93.0 88.8  PLT 152 137* 482*   Basic Metabolic Panel: Recent Labs  Lab 03/24/19 1144 03/26/19 0846 03/27/19 0827  NA 130* 131* 131*  K 3.4* 3.0* 3.3*  CL 94* 98 95*  CO2 25 21* 24  GLUCOSE 152* 241* 215*  BUN _0 CREATININE 0.82 0.88 0.88  CALCIUM 9.7 9.5 9.9   GFR: Estimated Creatinine Clearance: 44.2 mL/min (by C-G formula based on SCr of 0.88 mg/dL). Liver Function Tests: Recent Labs  Lab 03/24/19 1144 03/26/19 0846 03/27/19 0827  AST 35 46* 43*  ALT 37 37 38  ALKPHOS 64 59 62  BILITOT 1.0 1.0 1.0  PROT 6.6 6.0* 6.2*  ALBUMIN 3.9 3.4* 3.6   No results for input(s): LIPASE, AMYLASE in the last 168 hours. No results for input(s): AMMONIA in the last 168 hours. Coagulation Profile: Recent Labs  Lab 03/24/19 1145  INR 1.0   Cardiac Enzymes: Recent Labs  Lab 03/25/19 0847  CKTOTAL 234   BNP (last 3 results) No results for input(s): PROBNP in the last 8760 hours. HbA1C: No results for input(s): HGBA1C in the last 72 hours. CBG: Recent Labs  Lab 03/26/19 1701 03/26/19 2143 03/27/19 0751 03/27/19 1132 03/27/19 1649  GLUCAP 149* 149* 137* 157* 141*   Lipid Profile: Recent Labs    03/25/19 0439  CHOL 131  HDL 24*  LDLCALC 60  TRIG 233*  CHOLHDL 5.5   Thyroid Function Tests: No results for input(s): TSH, T4TOTAL, FREET4, T3FREE, THYROIDAB in the last 72 hours. Anemia Panel: Recent Labs    03/25/19 0847  VITAMINB12 950*  FERRITIN 1,064*   Sepsis Labs: Recent Labs  Lab 03/24/19 1155 03/24/19 1215 03/24/19 1414 03/25/19 0847  PROCALCITON 0.16  --   --  0.18  LATICACIDVEN  --  1.2 1.0  --     Recent Results (from the past 240 hour(s))  Novel Coronavirus,NAA,(SEND-OUT TO REF LAB - TAT 24-48 hrs); Hosp Order     Status: None   Collection Time: 03/24/19 11:51 AM    Specimen: Nasopharyngeal Swab; Respiratory  Result Value Ref Range Status   SARS-CoV-2, NAA NOT DETECTED NOT DETECTED Final    Comment: (NOTE) This test was developed and its performance characteristics determined by [employer]. This test has not been FDA cleared or approved. This test has been authorized by FDA under an Emergency Use Authorization (EUA). This test is only authorized for the duration of time the declaration that circumstances exist justifying the authorization of the emergency use of in vitro diagnostic tests for detection of SARS-CoV-2 virus and/or diagnosis of COVID-19 infection under section 564(b)(1) of the Act, 21 U.S.C. 500BBC-4(U)(8), unless the authorization is terminated or revoked sooner. When diagnostic testing is negative, the possibility of a false negative result should be considered in the context of a patient's recent exposures and the presence of clinical signs and symptoms consistent with COVID-19. An individual without symptoms of COVID-19 and who is not shedding SARS-CoV-2 virus would expect to have a negative (not  detected) result in this assay. Performed  At: [facility] [address] [place], [place] [id number] Rush Farmer MD BM:[id number]    [place]  Final    Comment: Performed at [facility], [address], [place], [place] [address]  MRSA PCR Screening     Status: None   Collection Time: 03/24/19  5:32 PM   Specimen: Nasal Mucosa; Nasopharyngeal  Result Value Ref Range Status   MRSA by PCR NEGATIVE NEGATIVE Final    Comment:        The GeneXpert MRSA Assay (FDA approved for NASAL specimens only), is one component of a comprehensive MRSA colonization surveillance program. It is not intended to diagnose MRSA infection nor to guide or monitor treatment for MRSA infections. Performed at [facility], [address] [place], [place] [address]   SARS Coronavirus 2 ([place] - Performed in  [facility] hospital lab), Hosp Order     Status: None   Collection Time: 03/25/19  7:30 AM   Specimen: Nasopharyngeal Swab  Result Value Ref Range Status   SARS Coronavirus 2 NEGATIVE NEGATIVE Final    Comment: (NOTE) If result is NEGATIVE SARS-CoV-2 target nucleic acids are NOT DETECTED. The SARS-CoV-2 RNA is generally detectable in upper and lower  respiratory specimens during the acute phase of infection. The lowest  concentration of SARS-CoV-2 viral copies this assay can detect is 250  copies / mL. A negative result does not preclude SARS-CoV-2 infection  and should not be used as the sole basis for treatment or other  patient management decisions.  A negative result may occur with  improper specimen collection / handling, submission of specimen other  than nasopharyngeal swab, presence of viral mutation(s) within the  areas targeted by this assay, and inadequate number of viral copies  (<250 copies / mL). A negative result must be combined with clinical  observations, patient history, and epidemiological information. If result is POSITIVE SARS-CoV-2 target nucleic acids are DETECTED. The SARS-CoV-2 RNA is generally detectable in upper and lower  respiratory specimens dur ing the acute phase of infection.  Positive  results are indicative of active infection with SARS-CoV-2.  Clinical  correlation with patient history and other diagnostic information is  necessary to determine patient infection status.  Positive results do  not rule out bacterial infection or co-infection with other viruses. If result is PRESUMPTIVE POSTIVE SARS-CoV-2 nucleic acids MAY BE PRESENT.   A presumptive positive result was obtained on the submitted specimen  and confirmed on repeat testing.  While 2019 novel coronavirus  (SARS-CoV-2) nucleic acids may be present in the submitted sample  additional confirmatory testing may be necessary for epidemiological  and / or clinical management purposes  to  differentiate between  SARS-CoV-2 and other Sarbecovirus currently known to infect humans.  If clinically indicated additional testing with an alternate test  methodology [phone]) is advised. The SARS-CoV-2 RNA is generally  detectable in upper and lower respiratory sp ecimens during the acute  phase of infection. The expected result is Negative. Fact Sheet for Patients:  StrictlyIdeas.no Fact Sheet for Healthcare Providers: BankingDealers.co.za This test is not yet approved or cleared by the [place] FDA and has been authorized for detection and/or diagnosis of SARS-CoV-2 by FDA under an Emergency Use Authorization (EUA).  This EUA will remain in effect (meaning this test can be used) for the duration of the COVID-19 declaration under Section 564(b)(1) of the Act, 21 U.S.C. section 360bbb-3(b)(1), unless the authorization is terminated or revoked  sooner. Performed at [facility], [address] [place], [place] [address]   Culture, blood (Routine X 2) w Reflex to ID Panel     Status: None (Preliminary result)   Collection Time: 03/25/19  8:46 AM   Specimen: BLOOD LEFT ARM  Result Value Ref Range Status   Specimen Description BLOOD LEFT ARM  Final   Special Requests   Final    BOTTLES DRAWN AEROBIC AND ANAEROBIC Blood Culture adequate volume   Culture   Final    NO GROWTH 2 DAYS Performed at [facility], [address] [place], [place] [address]    Report Status PENDING  Incomplete  Culture, blood (Routine X 2) w Reflex to ID Panel     Status: None (Preliminary result)   Collection Time: 03/25/19  8:52 AM   Specimen: BLOOD LEFT ARM  Result Value Ref Range Status   Specimen Description BLOOD LEFT ARM  Final   Special Requests   Final    BOTTLES DRAWN AEROBIC AND ANAEROBIC Blood Culture adequate volume   Culture   Final    NO GROWTH 2 DAYS Performed at [facility], [address] [place], [place] [address]    Report  Status PENDING  Incomplete  SARS Coronavirus 2     Status: None   Collection Time: 03/25/19  9:09 PM  Result Value Ref Range Status   SARS Coronavirus 2 NOT DETECTED NOT DETECTED Final    Comment: (NOTE) SARS-CoV-2 target nucleic acids are NOT DETECTED. The SARS-CoV-2 RNA is generally detectable in upper and lower respiratory specimens during the acute phase of infection.  Negative  results do not preclude SARS-CoV-2 infection, do not rule out co-infections with other pathogens, and should not be used as the sole basis for treatment or other patient management decisions.  Negative results must be combined with clinical observations, patient history, and epidemiological information. The expected result is Not Detected. Fact Sheet for Patients: http://www.biofiredefense.com/wp-content/uploads/2020/03/BIOFIRE-COVID -19-patients.pdf Fact Sheet for Healthcare Providers: http://www.biofiredefense.com/wp-content/uploads/2020/03/BIOFIRE-COVID -19-hcp.pdf This test is not yet approved or cleared by the [place] and  has been authorized for detection and/or diagnosis of SARS-CoV-2 by FDA under an Emergency Use Authorization (EUA).  This EUA will remain in effec t (meaning this test can be used) for the duration of  the COVID-19 declaration under Section 564(b)(1) of the Act, 21 U.S.C. section 360bbb-3(b)(1), unless the authorization is terminated or revoked sooner. Performed at [facility], [place] [address] [place], [place] [address]      Radiology Studies: No results found.  Scheduled Meds: . aspirin EC  81 mg Oral Daily  . enoxaparin (LOVENOX) injection  40 mg Subcutaneous Q24H  . insulin aspart  0-9 Units Subcutaneous TID WC  . potassium chloride  40 mEq Oral BID  . pravastatin  80 mg Oral q1800  . sodium chloride flush  3 mL Intravenous Once   Continuous Infusions:   LOS: 3 days   Marylu Lund, MD [facility] Pager On Amion  If 7PM-7AM, please  contact night-coverage 03/27/2019, 6:32 PM

## 2019-03-27 NOTE — Progress Notes (Signed)
STROKE TEAM PROGRESS NOTE    .   SUBJECTIVE (INTERVAL HISTORY) Patient is sitting up in bed.  She appears less confused today.  She is slow to respond to questions. Transthoracic echo appears unremarkable.   OBJECTIVE Vitals:   03/26/19 0750 03/26/19 1124 03/26/19 2053 03/27/19 0754  BP: (!) 108/59 (!) 122/58 115/62 106/60  Pulse: 85 90 82 95  Resp: 19 17 19    Temp: 98.2 F (36.8 C) 98.7 F (37.1 C) 98.5 F (36.9 C) 97.8 F (36.6 C)  TempSrc: Oral Oral Oral Oral  SpO2: 96% 95% 96% 95%  Weight:      Height:        CBC:  Recent Labs  Lab 03/25/19 0847 03/26/19 0846  WBC 1.9* 3.6*  NEUTROABS 1.1* 1.6*  HGB 11.7* 12.0  HCT 34.8* 34.1*  MCV 93.0 88.8  PLT 137* 131*    Basic Metabolic Panel:  Recent Labs  Lab 03/26/19 0846 03/27/19 0827  NA 131* 131*  K 3.0* 3.3*  CL 98 95*  CO2 21* 24  GLUCOSE 241* 215*  BUN 9 8  CREATININE 0.88 0.88  CALCIUM 9.5 9.9    Lipid Panel:     Component Value Date/Time   CHOL 131 03/25/2019 0439   CHOL 176 10/14/2018 1135   CHOL 135 03/16/2013 0902   TRIG 233 (H) 03/25/2019 0439   TRIG 90 09/03/2015 0814   TRIG 135 03/16/2013 0902   HDL 24 (L) 03/25/2019 0439   HDL 45 10/14/2018 1135   HDL 59 09/03/2015 0814   HDL 55 03/16/2013 0902   CHOLHDL 5.5 03/25/2019 0439   VLDL 47 (H) 03/25/2019 0439   LDLCALC 60 03/25/2019 0439   LDLCALC 95 10/14/2018 1135   LDLCALC 50 07/27/2014 1147   LDLCALC 53 03/16/2013 0902   HgbA1c:  Lab Results  Component Value Date   HGBA1C 7.2 (H) 03/24/2019   Urine Drug Screen:     Component Value Date/Time   LABOPIA NONE DETECTED 03/24/2019 1148   COCAINSCRNUR NONE DETECTED 03/24/2019 1148   LABBENZ NONE DETECTED 03/24/2019 1148   AMPHETMU NONE DETECTED 03/24/2019 1148   THCU NONE DETECTED 03/24/2019 1148   LABBARB NONE DETECTED 03/24/2019 1148    Alcohol Level     Component Value Date/Time   ETH <10 03/24/2019 1145    IMAGING  MRI Brain Wo Contrast 03/24/19 IMPRESSION: Small  area of acute infarction suspected in the right thalamus. No swelling or mass effect. Chronic small-vessel ischemic changes elsewhere throughout the brain as outlined above.   CT Head WO Contrast 03/24/19 IMPRESSION: 1.  No acute intracranial abnormality. 2. Mild atrophy and chronic microvascular ischemic changes.   Transthoracic Echocardiogram  IMPRESSIONS  1. The left ventricle has hyperdynamic systolic function, with an ejection fraction of >65%. The cavity size was normal. Left ventricular diastolic parameters were normal.  2. The right ventricle has normal systolic function. The cavity was normal. There is no increase in right ventricular wall thickness.  3. Mild thickening of the mitral valve leaflet.  4. The tricuspid valve is grossly normal.  5. The aortic valve is tricuspid. Mild thickening of the aortic valve.   Bilateral Carotid Dopplers - pending   Transcranial Dopplers - pending   EKG - ST rate 100 BPM. (See cardiology reading for complete details)   PHYSICAL EXAM Blood pressure 106/60, pulse 95, temperature 97.8 F (36.6 C), temperature source Oral, resp. rate 19, height 5\' 2"  (1.575 m), weight 69.3 kg, SpO2 95 %. Pleasant elderly  Caucasian lady not in distress. . Afebrile. Head is nontraumatic. Neck is supple without bruit.    Cardiac exam no murmur or gallop. Lungs are clear to auscultation. Distal pulses are well felt.  Neurological Exam ;  Awake  Alert oriented x 2.  Diminished attention, registration and recall.  Speech is nonfluent and hesitant.  Speaks short sentences only.  Able to follow simple midline and one-step commands.  No paraphasic errors.  Normal speech and language.eye movements full without nystagmus.fundi were not visualized. Vision acuity and fields appear normal. Hearing is normal. Palatal movements are normal. Face asymmetric with mild left lower facial weakness.. Tongue midline. Normal strength, tone, reflexes and coordination except mild  weakness of left grip and intrinsic hand muscles.  Orbits right over left upper extremity.  Fine finger movements are diminished on the left.. Normal sensation. Gait deferred.     ASSESSMENT/PLAN [name] is a 85 y.o. female with history of diabetes, diverticulitis, remote hx of tobacco use and obesity  presenting with confusion and speech difficulties. She did not receive IV t-PA due to late presentation (>4.5 hours from time of onset)  Stroke:  Right thalamic infarct - small vessel disease.  Resultant  Mild confusion  CT head  - no acute findings.  MRI head - Small area of acute infarction suspected in the right thalamus.  MRA head - not performed  CTA H&N - not performed  Carotid Doppler - pending  Trans Cranial Dopplers - pending  2D Echo  - EF >60 %. No cardiac source of emboli identified.   Sars Corona Virus 2 - negative  LDL - 60  HgbA1c - 7.2  UDS -  - negative  VTE prophylaxis - Lovenox  Diet  - Heart healthy with thin liquids.  aspirin 81 mg daily prior to admission, now on aspirin 81 mg daily  Patient counseled to be compliant with her antithrombotic medications  Ongoing aggressive stroke risk factor management  Therapy recommendations:  No F/U OT - PT eval pending  Disposition:  Pending  Hypertension  Stable . Permissive hypertension (OK if < 220/120) but gradually normalize in 5-7 days . Long-term BP goal normotensive  Hyperlipidemia  Lipid lowering medication PTA: Pravastatin 80 mg daily  LDL 60, goal < 70  Current lipid lowering medication: Pravastatin 80 mg daily  Continue statin at discharge  Diabetes  HgbA1c 7.2, goal < 7.0  Uncontrolled  Other Stroke Risk Factors  Advanced age  Former cigarette smoker - quit  Overweight, Body mass index is 27.94 kg/m., recommend weight loss, diet and exercise as appropriate    Other Active Problems  Hypokalemia - 3.3  Hyponatremia - 131  Leukopenia - 3.6  Mild  thrombocytopenia - 131  Hyperglycemia   Hospital day # 3  Patient is yet awaiting Doppler studies but after the results could potentially be discharged back to to home. Antony Contras, MD  To contact Stroke Continuity provider, please refer to [email or web address] After hours, contact General Neurology

## 2019-03-27 NOTE — Progress Notes (Signed)
Spoke with pt family Ronalee Belts on how patient interacted this am with staff, no distress noted. Request for MD to call family and update on status and plan of care d/t family having mores specific questions r/t patient.

## 2019-03-28 ENCOUNTER — Inpatient Hospital Stay (HOSPITAL_COMMUNITY): Payer: Medicare Other

## 2019-03-28 DIAGNOSIS — I639 Cerebral infarction, unspecified: Secondary | ICD-10-CM

## 2019-03-28 LAB — GLUCOSE, CAPILLARY
Glucose-Capillary: 133 mg/dL — ABNORMAL HIGH (ref 70–99)
Glucose-Capillary: 166 mg/dL — ABNORMAL HIGH (ref 70–99)
Glucose-Capillary: 167 mg/dL — ABNORMAL HIGH (ref 70–99)

## 2019-03-28 LAB — COMPREHENSIVE METABOLIC PANEL
ALT: 36 U/L (ref 0–44)
AST: 36 U/L (ref 15–41)
Albumin: 3.3 g/dL — ABNORMAL LOW (ref 3.5–5.0)
Alkaline Phosphatase: 61 U/L (ref 38–126)
Anion gap: 9 (ref 5–15)
BUN: 11 mg/dL (ref 8–23)
CO2: 25 mmol/L (ref 22–32)
Calcium: 9.9 mg/dL (ref 8.9–10.3)
Chloride: 99 mmol/L (ref 98–111)
Creatinine, Ser: 0.85 mg/dL (ref 0.44–1.00)
GFR calc Af Amer: >60 mL/min (ref >60)
GFR calc non Af Amer: >60 mL/min (ref >60)
Glucose, Bld: 200 mg/dL — ABNORMAL HIGH (ref 70–99)
Potassium: 4 mmol/L (ref 3.5–5.1)
Sodium: 133 mmol/L — ABNORMAL LOW (ref 135–145)
Total Bilirubin: 0.6 mg/dL (ref 0.3–1.2)
Total Protein: 5.8 g/dL — ABNORMAL LOW (ref 6.5–8.1)

## 2019-03-28 LAB — FOLATE RBC
Folate, Hemolysate: 476 ng/mL
Folate, RBC: 1392 ng/mL (ref >498)
Hematocrit: 34.2 % (ref 34.0–46.6)

## 2019-03-28 MED ORDER — NYSTATIN 100000 UNIT/GM EX CREA
TOPICAL_CREAM | Freq: Two times a day (BID) | CUTANEOUS | Status: DC
  Administered 2019-03-28: 1 via TOPICAL
  Filled 2019-03-28: qty 15

## 2019-03-28 MED ORDER — NYSTATIN 100000 UNIT/GM EX CREA: TOPICAL_CREAM | Freq: Two times a day (BID) | CUTANEOUS | 0 refills | Status: DC

## 2019-03-28 NOTE — Progress Notes (Signed)
Paged MD per family request to receive a call to update on pt status and possible d/c date.

## 2019-03-28 NOTE — Progress Notes (Signed)
STROKE TEAM PROGRESS NOTE    .   SUBJECTIVE (INTERVAL HISTORY) Patient is sitting up in bedside chair.  She appears less confused today.   Transthoracic echo appears unremarkable.Carotid dopplers show no significant stenosis. Transcranial dopplers have poor windows but no stenosis   OBJECTIVE Vitals:   03/27/19 0754 03/27/19 1702 03/27/19 2146 03/28/19 0731  BP: 106/60 (!) 118/59 (!) 121/57 121/62  Pulse: 95 78 79 77  Resp:  19 16 14   Temp: 97.8 F (36.6 C) 97.8 F (36.6 C) 97.7 F (36.5 C) 98.5 F (36.9 C)  TempSrc: Oral Oral Oral Oral  SpO2: 95% 96% 95% 93%  Weight:      Height:        CBC:  Recent Labs  Lab 03/25/19 0847 03/26/19 0846  WBC 1.9* 3.6*  NEUTROABS 1.1* 1.6*  HGB 11.7* 12.0  HCT 34.8*  34.2 34.1*  MCV 93.0 88.8  PLT 137* 131*    Basic Metabolic Panel:  Recent Labs  Lab 03/27/19 0827 03/28/19 0858  NA 131* 133*  K 3.3* 4.0  CL 95* 99  CO2 24 25  GLUCOSE 215* 200*  BUN 8 11  CREATININE 0.88 0.85  CALCIUM 9.9 9.9    Lipid Panel:     Component Value Date/Time   CHOL 131 03/25/2019 0439   CHOL 176 10/14/2018 1135   CHOL 135 03/16/2013 0902   TRIG 233 (H) 03/25/2019 0439   TRIG 90 09/03/2015 0814   TRIG 135 03/16/2013 0902   HDL 24 (L) 03/25/2019 0439   HDL 45 10/14/2018 1135   HDL 59 09/03/2015 0814   HDL 55 03/16/2013 0902   CHOLHDL 5.5 03/25/2019 0439   VLDL 47 (H) 03/25/2019 0439   LDLCALC 60 03/25/2019 0439   LDLCALC 95 10/14/2018 1135   LDLCALC 50 07/27/2014 1147   LDLCALC 53 03/16/2013 0902   HgbA1c:  Lab Results  Component Value Date   HGBA1C 7.2 (H) 03/24/2019   Urine Drug Screen:     Component Value Date/Time   LABOPIA NONE DETECTED 03/24/2019 1148   COCAINSCRNUR NONE DETECTED 03/24/2019 1148   LABBENZ NONE DETECTED 03/24/2019 1148   AMPHETMU NONE DETECTED 03/24/2019 1148   THCU NONE DETECTED 03/24/2019 1148   LABBARB NONE DETECTED 03/24/2019 1148    Alcohol Level     Component Value Date/Time   ETH <10  03/24/2019 1145    IMAGING  MRI Brain Wo Contrast 03/24/19 IMPRESSION: Small area of acute infarction suspected in the right thalamus. No swelling or mass effect. Chronic small-vessel ischemic changes elsewhere throughout the brain as outlined above.   CT Head WO Contrast 03/24/19 IMPRESSION: 1.  No acute intracranial abnormality. 2. Mild atrophy and chronic microvascular ischemic changes.   Transthoracic Echocardiogram  IMPRESSIONS  1. The left ventricle has hyperdynamic systolic function, with an ejection fraction of >65%. The cavity size was normal. Left ventricular diastolic parameters were normal.  2. The right ventricle has normal systolic function. The cavity was normal. There is no increase in right ventricular wall thickness.  3. Mild thickening of the mitral valve leaflet.  4. The tricuspid valve is grossly normal.  5. The aortic valve is tricuspid. Mild thickening of the aortic valve.   Bilateral Carotid Dopplers -bilateral 1-39% stenosis.   Transcranial Dopplers -absent bitemporal windows limits the study..  Antegrade flow in both carotid siphons and ophthalmics.  EKG - ST rate 100 BPM. (See cardiology reading for complete details)   PHYSICAL EXAM Blood pressure 121/62, pulse 77, temperature  98.5 F (36.9 C), temperature source Oral, resp. rate 14, height 5\' 2"  (1.575 m), weight 69.3 kg, SpO2 93 %. Pleasant elderly Caucasian lady not in distress. . Afebrile. Head is nontraumatic. Neck is supple without bruit.    Cardiac exam no murmur or gallop. Lungs are clear to auscultation. Distal pulses are well felt.  Neurological Exam ;  Awake  Alert oriented x 2.  Diminished attention, registration and recall.  Speech is nonfluent and hesitant.  Speaks short sentences only.  Able to follow simple midline and one-step commands.  No paraphasic errors.  Normal speech and language.eye movements full without nystagmus.fundi were not visualized. Vision acuity and fields appear  normal. Hearing is normal. Palatal movements are normal. Face asymmetric with mild left lower facial weakness.. Tongue midline. Normal strength, tone, reflexes and coordination except mild weakness of left grip and intrinsic hand muscles.  Orbits right over left upper extremity.  Fine finger movements are diminished on the left.. Normal sensation. Gait deferred.     ASSESSMENT/PLAN Theresa Watson is a 85 y.o. female with history of diabetes, diverticulitis, remote hx of tobacco use and obesity  presenting with confusion and speech difficulties. She did not receive IV t-PA due to late presentation (>4.5 hours from time of onset)  Stroke:  Right thalamic infarct - small vessel disease.  Resultant  Mild confusion  CT head  - no acute findings.  MRI head - Small area of acute infarction suspected in the right thalamus.  MRA head - not performed  CTA H&N - not performed  Carotid Doppler -no significant bilateral carotid stenosis.  Trans Cranial Dopplers -suboptimal absent bitemporal windows.  2D Echo  - EF >60 %. No cardiac source of emboli identified.   Sars Corona Virus 2 - negative  LDL - 60  HgbA1c - 7.2  UDS -  - negative  VTE prophylaxis - Lovenox  Diet  - Heart healthy with thin liquids.  aspirin 81 mg daily prior to admission, now on aspirin 81 mg daily  Patient counseled to be compliant with her antithrombotic medications  Ongoing aggressive stroke risk factor management  Therapy recommendations:  No F/U OT - PT eval pending  Disposition:  Pending  Hypertension  Stable . Permissive hypertension (OK if < 220/120) but gradually normalize in 5-7 days . Long-term BP goal normotensive  Hyperlipidemia  Lipid lowering medication PTA: Pravastatin 80 mg daily  LDL 60, goal < 70  Current lipid lowering medication: Pravastatin 80 mg daily  Continue statin at discharge  Diabetes  HgbA1c 7.2, goal < 7.0  Uncontrolled  Other Stroke Risk  Factors  Advanced age  Former cigarette smoker - quit  Overweight, Body mass index is 27.94 kg/m., recommend weight loss, diet and exercise as appropriate    Other Active Problems  Hypokalemia - 3.3  Hyponatremia - 131  Leukopenia - 3.6  Mild thrombocytopenia - 131  Hyperglycemia   Hospital day # 4  Patient can be discharged back to to home.  Stroke team will sign off.  Kindly call for questions.  Discussed with Dr. Theresa Mulligan, MD  To contact Stroke Continuity provider, please refer to [email or web address] After hours, contact General Neurology

## 2019-03-28 NOTE — Care Management Important Message (Signed)
Important Message  Patient Details  Name: Theresa Watson MRN: [id number] Date of Birth: 17-Jul-1933   Medicare Important Message Given:  Yes     Tatumn Corbridge 03/28/2019, 2:11 PM

## 2019-03-28 NOTE — Progress Notes (Signed)
Patient d/c'd via wheel chair. No concerns voiced at discharge.

## 2019-03-28 NOTE — TOC Transition Note (Addendum)
Transition of Care [facility]) - CM/SW Discharge Note   Patient Details  Name: Theresa Watson MRN: [id number] Date of Birth: 1933-06-25  Transition of Care [facility]) CM/SW Contact:  Zenon Mayo, RN Phone Number: 03/28/2019, 4:07 PM   Clinical Narrative:    Patient for dc to home today, PT eval rec HHPT, but daughter [name] is asking if she can take patient to outpatient therapy.  NCM states if this is what she would like to do , it should be ok.  NCM asked MD to write script for outpatient PT.  Patient will be for discharge today.  Order is in for outpatient physical therapy with [facility] in [place].   Final next level of care: OP Rehab Barriers to Discharge: No Barriers Identified   Patient Goals and CMS Choice Patient states their goals for this hospitalization and ongoing recovery are:: go  home      Discharge Placement                       Discharge Plan and Services                DME Arranged: (NA)         HH Arranged: NA          Social Determinants of Health (SDOH) Interventions     Readmission Risk Interventions No flowsheet data found.

## 2019-03-28 NOTE — Progress Notes (Signed)
Physical Therapy Treatment Patient Details Name: Theresa Watson MRN: [id number] DOB: Oct 28, 1932 Today's Date: 03/28/2019    History of Present Illness Pt is an 85 yo female s/p MRI of the brain showed a small acute right thalamic stroke. PMHx: DM, HTN, HLD.    PT Comments    Pt with excellent progression from last P.T. session at [facility]. Pt able to walk in hall without Rw with noted balance deficits with continued recommendation for RW use with gait at all times. Pt reports she can live with family in [place] and have initial supervision post CVA which is recommended. Pt with some word finding and problem solving difficulties with balance deficits noted with gait. D/C plan updated to reflect current function and family ability to assist.  Will continue to follow acutely and recommend mobility with nursing assist.    Follow Up Recommendations  Supervision/Assistance - 24 hour;Home health PT     Equipment Recommendations  None recommended by PT    Recommendations for Other Services       Precautions / Restrictions Precautions Precautions: Fall    Mobility  Bed Mobility               General bed mobility comments: in chair on arrival  Transfers Overall transfer level: Needs assistance   Transfers: Sit to/from Stand Sit to Stand: Supervision         General transfer comment: supervision from chair with good hand placement  Ambulation/Gait Ambulation/Gait assistance: Min guard Gait Distance (Feet): 550 Feet Assistive device: None Gait Pattern/deviations: Step-through pattern;Decreased stride length;Drifts right/left   Gait velocity interpretation: 1.31 - 2.62 ft/sec, indicative of limited community ambulator General Gait Details: without Rw pt drifting right and left with ability to maintain her balance but unaware of deficits. Pt reports she has been using RW in room and feels steadier. Recommend continued RW use of all gait   Stairs              Wheelchair Mobility    Modified Rankin (Stroke Patients Only)       Balance Overall balance assessment: Needs assistance   Sitting balance-Leahy Scale: Good       Standing balance-Leahy Scale: Good Standing balance comment: able to stand and walk without UE assist but deficits noted                            Cognition Arousal/Alertness: Awake/alert Behavior During Therapy: WFL for tasks assessed/performed Overall Cognitive Status: Impaired/Different from baseline Area of Impairment: Memory                     Memory: Decreased short-term memory         General Comments: pt oriented, unable to recall room number, able to state 2/3 animals that start with c, able to state how to call 911 in emergency      Exercises      General Comments        Pertinent Vitals/Pain      Home Living                      Prior Function            PT Goals (current goals can now be found in the care plan section) Progress towards PT goals: Goals met and updated - see care plan    Frequency    Min 3X/week  PT Plan Discharge plan needs to be updated    Co-evaluation              AM-PAC PT "6 Clicks" Mobility   Outcome Measure  Help needed turning from your back to your side while in a flat bed without using bedrails?: None Help needed moving from lying on your back to sitting on the side of a flat bed without using bedrails?: A Little Help needed moving to and from a bed to a chair (including a wheelchair)?: A Little Help needed standing up from a chair using your arms (e.g., wheelchair or bedside chair)?: A Little Help needed to walk in hospital room?: A Little Help needed climbing 3-5 steps with a railing? : A Little 6 Click Score: 19    End of Session Equipment Utilized During Treatment: Gait belt Activity Tolerance: Patient tolerated treatment well Patient left: in chair;with call bell/phone within reach;with chair  alarm set Nurse Communication: Mobility status PT Visit Diagnosis: Unsteadiness on feet (R26.81);Other abnormalities of gait and mobility (R26.89);Muscle weakness (generalized) (M62.81)     Time: 3539-1225 PT Time Calculation (min) (ACUTE ONLY): 16 min  Charges:                        Elwyn Reach, PT Acute Rehabilitation Services Pager: [phone] Office: [place] 03/28/2019, 12:48 PM

## 2019-03-28 NOTE — Discharge Summary (Signed)
Physician Discharge Summary  Theresa Watson WSF:[id number] DOB: [birth year] DOA: 03/24/2019  PCP: [name], MD  Admit date: 03/24/2019 Discharge date: 03/28/2019  Admitted From: Home Disposition:  Home  Recommendations for Outpatient Follow-up:  1. Follow up with PCP in 1-2 weeks 2. Follow up with outpatient PT  Discharge Condition:Stable CODE STATUS:Full Diet recommendation: Heart healthy, diabetic   Brief/Interim Summary: 85 year old female with a history of hypertension, hyperlipidemia, diabetes mellitus presenting with altered mental status. The patient at baseline is highly functional and able to perform all her ADLs without assistance. The patient was last seen normal in the afternoon of 03/23/2019. In the morning of 03/24/2019, the patient did not answer her phone when she was called. A neighbor went to check up on her and noted the patient to be confused and wandering with word finding difficulties. MRI of the brain showed a small acuteright thalamic stroke. As result, the patient was brought to the hospital for further evaluation. Since admission, the patient developed a fever of 101.1 F. The patient has leukopenia and lymphopenia and thrombocytopenia. Repeat COVID testing has been ordered as pt is PUI.The patient herself denies any headache, neck pain, chest pain, shortness breath,coughing, nausea, vomiting, diarrhea, abdominal pain, dysuria. There is no anosmia. She denies any visual disturbance or focal extremity weakness. The patient is being transferred to [facility] as a PUI and for neurology eval.  Discharge Diagnoses:  Principal Problem:   Acute metabolic encephalopathy Active Problems:   IBS (irritable bowel syndrome)   Hypertension   Diabetes mellitus due to underlying condition with diabetic autonomic neuropathy, without long-term current use of insulin ([place])   Hyponatremia   Acute ischemic stroke [facility] 2)  Acute right thalamic stroke -Neurology  Consulted and is following. Discussed case with Dr. Leonie Man -Therapy recs for SNF -CT brain noted to be neg -MRI brain--acute right thalamic stroke seen, personally reviewed -Carotid Duplex reviewed, no occlusion B -Echo- reviewed, unremarkable with normal LVEF -LDL-60 -HbA1C-7.2 -Antiplatelet--ASA 81 mg  Fever/leukopenia/thrombocytopenia -she is currently being treated as a PUI for COVID-19 -CEPHEID test neg -Ferritin 1,064 -CRP 8.1 -Procalcitonin of 0.18 -Check LDH, ESR, d-dimer, CK -blood cultures x 2 sets neg  Hyperlipidemia -Continue statin -Remains stable at this time  Essential hypertension -Held Maxide to allow for permissive hypertension -BP remains stable and controlled  Diabetes Mellitus type 2 -Hemoglobin A1c of 7.2   Discharge Instructions   Allergies as of 03/28/2019      Reactions   Levaquin [levofloxacin] Swelling   tongue   Cephalexin    Penicillins    .Did it involve swelling of the face/tongue/throat, SOB, or low BP? Unknown Did it involve sudden or severe rash/hives, skin peeling, or any reaction on the inside of your mouth or nose? Unknown Did you need to seek medical attention at a hospital or doctor's office? Unknown When did it last happen? If all above answers are "NO", may proceed with cephalosporin use.   Lisinopril Cough      Medication List    TAKE these medications   Accu-Chek Aviva Plus test strip Generic drug: glucose blood TEST TWO TIMES DAILY AS  DIRECTED   aspirin 81 MG EC tablet Take 81 mg by mouth daily.   CALCIUM 600 + D PO Take 1 tablet by mouth daily.   ferrous sulfate 325 (65 FE) MG EC tablet Take 325 mg by mouth daily with breakfast.   Fish Oil 1000 MG Caps Take 2 capsules by mouth daily.   gabapentin 100  MG capsule Commonly known as: NEURONTIN Take 1 cap PO in the AM and 3 caps po QHS   Garlic 121 MG Tabs Take 1 tablet by mouth 2 (two) times daily.   geriatric multivitamins-minerals Elix Take  15 mLs by mouth daily.   nystatin cream Commonly known as: MYCOSTATIN Apply topically 2 (two) times daily. To affected areas   omeprazole 20 MG capsule Commonly known as: PRILOSEC Take 20 mg by mouth daily.   potassium chloride SA 20 MEQ tablet Commonly known as: K-DUR TAKE 1 TABLET BY MOUTH  DAILY   pravastatin 80 MG tablet Commonly known as: PRAVACHOL TAKE 1 TABLET BY MOUTH  DAILY   triamterene-hydrochlorothiazide 37.5-25 MG tablet Commonly known as: MAXZIDE-25 TAKE 1 TABLET BY MOUTH  DAILY   vitamin C 500 MG tablet Commonly known as: ASCORBIC ACID Take 500 mg by mouth daily.   Vitamin D3 50 MCG (2000 UT) Tabs Take 1 tablet by mouth daily.   VITAMIN E PO Take 1 capsule by mouth daily.       Allergies  Allergen Reactions  . Levaquin [Levofloxacin] Swelling    tongue  . Cephalexin   . Penicillins     .Did it involve swelling of the face/tongue/throat, SOB, or low BP? Unknown Did it involve sudden or severe rash/hives, skin peeling, or any reaction on the inside of your mouth or nose? Unknown Did you need to seek medical attention at a hospital or doctor's office? Unknown When did it last happen? If all above answers are "NO", may proceed with cephalosporin use.   Marland Kitchen Lisinopril Cough    Consultations:  Neurology  Procedures/Studies: Ct Head Wo Contrast  Result Date: 03/24/2019 CLINICAL DATA:  Altered mental status and confusion. EXAM: CT HEAD WITHOUT CONTRAST TECHNIQUE: Contiguous axial images were obtained from the base of the skull through the vertex without intravenous contrast. COMPARISON:  None. FINDINGS: Brain: No evidence of acute infarction, hemorrhage, hydrocephalus, extra-axial collection or mass lesion/mass effect. Mild generalized atrophy, expected for age. Scattered mild periventricular and subcortical white matter hypodensities are nonspecific, but favored to reflect chronic microvascular ischemic changes. Vascular: Atherosclerotic vascular  calcification of the carotid siphons. No hyperdense vessel. Skull: Normal. Negative for fracture or focal lesion. Sinuses/Orbits: No acute finding. Other: None. IMPRESSION: 1.  No acute intracranial abnormality. 2. Mild atrophy and chronic microvascular ischemic changes. Electronically Signed   By: Titus Dubin M.D.   On: 03/24/2019 14:29   Mr Brain Wo Contrast  Result Date: 03/24/2019 CLINICAL DATA:  Acute presentation with altered level of consciousness and confusion. EXAM: MRI HEAD WITHOUT CONTRAST TECHNIQUE: Multiplanar, multiecho pulse sequences of the brain and surrounding structures were obtained without intravenous contrast. COMPARISON:  Head CT earlier same day FINDINGS: Brain: Diffusion imaging shows a small focus restricted diffusion in the right thalamus consistent with acute infarction. No other acute insult. There are mild chronic small-vessel ischemic changes of the pons. No focal cerebellar abnormality. Cerebral hemispheres show moderate changes of chronic small vessel disease affecting the deep and subcortical white matter. No cortical or large vessel territory infarction. No mass lesion, hemorrhage, hydrocephalus or extra-axial collection. Vascular: Major vessels at the base of the brain show flow. Skull and upper cervical spine: Negative Sinuses/Orbits: Clear/normal Other: None IMPRESSION: Small area of acute infarction suspected in the right thalamus. No swelling or mass effect. Chronic small-vessel ischemic changes elsewhere throughout the brain as outlined above. Electronically Signed   By: Nelson Chimes M.D.   On: 03/24/2019 16:07   Ct  Abdomen Pelvis W Contrast  Result Date: 03/24/2019 CLINICAL DATA:  Acute generalized abdominal pain. History of IBS and diverticulosis. EXAM: CT ABDOMEN AND PELVIS WITH CONTRAST TECHNIQUE: Multidetector CT imaging of the abdomen and pelvis was performed using the standard protocol following bolus administration of intravenous contrast. CONTRAST:  119m  OMNIPAQUE IOHEXOL 300 MG/ML  SOLN COMPARISON:  CT dated September 03, 2016. FINDINGS: Lower chest: No acute abnormality. Hepatobiliary: No focal liver abnormality is seen. Status post cholecystectomy. No biliary dilatation. Pancreas: Unremarkable. No pancreatic ductal dilatation or surrounding inflammatory changes. Spleen: Normal in size without focal abnormality. Adrenals/Urinary Tract: Adrenal glands are unremarkable. Kidneys are normal, without renal calculi, focal lesion, or hydronephrosis. Bladder is unremarkable. Stomach/Bowel: There is scattered colonic diverticula without CT evidence of diverticulitis. There is a moderate amount of stool in the colon. The appendix is normal. There is a small hiatal hernia. The stomach is otherwise unremarkable. Vascular/Lymphatic: Aortic atherosclerosis. No enlarged abdominal or pelvic lymph nodes. Reproductive: Status post hysterectomy. No adnexal masses. Other: No abdominal wall hernia or abnormality. No abdominopelvic ascites. A rectocele and cystocele are noted. Musculoskeletal: Advanced degenerative changes are noted throughout the visualized lumbar spine. There is no displaced fracture. No dislocation. IMPRESSION: 1. No acute intra-abdominal abnormality detected. 2. Chronic changes as above including a rectocele and cystocele. Electronically Signed   By: CConstance HolsterM.D.   On: 03/24/2019 14:33   Dg Chest Portable 1 View  Result Date: 03/24/2019 CLINICAL DATA:  Altered mental status and confusion EXAM: PORTABLE CHEST 1 VIEW COMPARISON:  10/14/2018 and prior radiographs FINDINGS: The cardiomediastinal silhouette is unremarkable. There is no evidence of focal airspace disease, pulmonary edema, suspicious pulmonary nodule/mass, pleural effusion, or pneumothorax. No acute bony abnormalities are identified. IMPRESSION: No active disease. Electronically Signed   By: JMargarette CanadaM.D.   On: 03/24/2019 12:23   Dg Hips Bilat W Or Wo Pelvis 2 Views  Result Date:  03/24/2019 CLINICAL DATA:  Hip pain. Bilateral hip pain. Right is more severe than left. EXAM: DG HIP (WITH OR WITHOUT PELVIS) 2V BILAT COMPARISON:  None. FINDINGS: There is osteopenia which limits detection of nondisplaced fractures. 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 There is mild-to-moderate left-sided osteoarthritis. Evaluation of the right hip is somewhat limited by suboptimal patient positioning. IMPRESSION: No acute osseous abnormality. Electronically Signed   By: CConstance HolsterM.D.   On: 03/24/2019 13:58   Vas [place]otid  Result Date: 03/28/2019 Carotid Arterial Duplex Study Indications:  CVA. Risk Factors: Diabetes.  Examination Guidelines: A complete evaluation includes B-mode imaging, spectral Doppler, color Doppler, and power Doppler as needed of all accessible portions of each vessel. Bilateral testing is considered an integral part of a complete examination. Limited examinations for reoccurring indications may be performed as noted.  Right Carotid Findings: +----------+--------+--------+--------+-----------+--------+           PSV cm/sEDV cm/sStenosisDescribe   Comments +----------+--------+--------+--------+-----------+--------+ CCA Prox  94      16              homogeneous         +----------+--------+--------+--------+-----------+--------+ CCA Distal52      15              homogeneous         +----------+--------+--------+--------+-----------+--------+ ICA Prox  71      22      1-39%   homogeneous         +----------+--------+--------+--------+-----------+--------+ ICA Distal101     32                                  +----------+--------+--------+--------+-----------+--------+  ECA       65      5                                   +----------+--------+--------+--------+-----------+--------+ +----------+--------+-------+--------+-------------------+           PSV cm/sEDV cmsDescribeArm Pressure (mmHG)  +----------+--------+-------+--------+-------------------+ Subclavian100                                        +----------+--------+-------+--------+-------------------+ +---------+--------+--+--------+-+---------+ VertebralPSV cm/s31EDV cm/s8Antegrade +---------+--------+--+--------+-+---------+  Left Carotid Findings: +----------+--------+--------+--------+-----------+--------+           PSV cm/sEDV cm/sStenosisDescribe   Comments +----------+--------+--------+--------+-----------+--------+ CCA Prox  94      18              homogeneous         +----------+--------+--------+--------+-----------+--------+ CCA Distal79      20              homogeneous         +----------+--------+--------+--------+-----------+--------+ ICA Prox  106     30      1-39%   homogeneous         +----------+--------+--------+--------+-----------+--------+ ICA Distal80      27                                  +----------+--------+--------+--------+-----------+--------+ ECA       91      9                                   +----------+--------+--------+--------+-----------+--------+ +----------+--------+--------+--------+-------------------+ SubclavianPSV cm/sEDV cm/sDescribeArm Pressure (mmHG) +----------+--------+--------+--------+-------------------+           103                                         +----------+--------+--------+--------+-------------------+ +---------+--------+--+--------+-+---------+ VertebralPSV cm/s38EDV cm/s7Antegrade +---------+--------+--+--------+-+---------+  Summary: Right Carotid: Velocities in the right ICA are consistent with a 1-39% stenosis. Left Carotid: Velocities in the left ICA are consistent with a 1-39% stenosis. Vertebrals: Bilateral vertebral arteries demonstrate antegrade flow. *See table(s) above for measurements and observations.     Preliminary    Vas [place] Transcranial Doppler  Result Date: 03/28/2019  Transcranial  Doppler Indications: Stroke. Limitations for diagnostic windows: Unable to insonate left transtemporal window. Performing Technologist: Abram Sander RVS  Examination Guidelines: A complete evaluation includes B-mode imaging, spectral Doppler, color Doppler, and power Doppler as needed of all accessible portions of each vessel. Bilateral testing is considered an integral part of a complete examination. Limited examinations for reoccurring indications may be performed as noted.  +----------+-------------+----------+-----------+-------+ RIGHT TCD Right VM (cm)Depth (cm)PulsatilityComment +----------+-------------+----------+-----------+-------+ PCA           40.00                 1.23            +----------+-------------+----------+-----------+-------+ Opthalmic     15.00                 1.34            +----------+-------------+----------+-----------+-------+ ICA siphon    57.00  1.29            +----------+-------------+----------+-----------+-------+  +----------+------------+----------+-----------+-------+ LEFT TCD  Left VM (cm)Depth (cm)PulsatilityComment +----------+------------+----------+-----------+-------+ Opthalmic    17.00                 1.29            +----------+------------+----------+-----------+-------+ ICA siphon   56.00                 1.06            +----------+------------+----------+-----------+-------+     Preliminary      Subjective: Eager to go home  Discharge Exam: Vitals:   03/27/19 2146 03/28/19 0731  BP: (!) 121/57 121/62  Pulse: 79 77  Resp: 16 14  Temp: 97.7 F (36.5 C) 98.5 F (36.9 C)  SpO2: 95% 93%   Vitals:   03/27/19 0754 03/27/19 1702 03/27/19 2146 03/28/19 0731  BP: 106/60 (!) 118/59 (!) 121/57 121/62  Pulse: 95 78 79 77  Resp:  19 16 14   Temp: 97.8 F (36.6 C) 97.8 F (36.6 C) 97.7 F (36.5 C) 98.5 F (36.9 C)  TempSrc: Oral Oral Oral Oral  SpO2: 95% 96% 95% 93%  Weight:      Height:         General: Pt is alert, awake, not in acute distress Cardiovascular: RRR, S1/S2 +, no rubs, no gallops Respiratory: CTA bilaterally, no wheezing, no rhonchi Abdominal: Soft, NT, ND, bowel sounds + Extremities: no edema, no cyanosis   The results of significant diagnostics from this hospitalization (including imaging, microbiology, ancillary and laboratory) are listed below for reference.     Microbiology: Recent Results (from the past 240 hour(s))  Novel Coronavirus,NAA,(SEND-OUT TO REF LAB - TAT 24-48 hrs); Hosp Order     Status: None   Collection Time: 03/24/19 11:51 AM   Specimen: Nasopharyngeal Swab; Respiratory  Result Value Ref Range Status   SARS-CoV-2, NAA NOT DETECTED NOT DETECTED Final    Comment: (NOTE) This test was developed and its performance characteristics determined by [employer]. This test has not been FDA cleared or approved. This test has been authorized by FDA under an Emergency Use Authorization (EUA). This test is only authorized for the duration of time the declaration that circumstances exist justifying the authorization of the emergency use of in vitro diagnostic tests for detection of SARS-CoV-2 virus and/or diagnosis of COVID-19 infection under section 564(b)(1) of the Act, 21 U.S.C. 322GUR-4(Y)(7), unless the authorization is terminated or revoked sooner. When diagnostic testing is negative, the possibility of a false negative result should be considered in the context of a patient's recent exposures and the presence of clinical signs and symptoms consistent with COVID-19. An individual without symptoms of COVID-19 and who is not shedding SARS-CoV-2 virus would expect to have a negative (not detected) result in this assay. Performed  At: [facility] [address] [place], [place] [id number] Rush Farmer MD TD:[id number]    [place]  Final    Comment: Performed at [facility], [address]  [place], [place] [address]  MRSA PCR Screening     Status: None   Collection Time: 03/24/19  5:32 PM   Specimen: Nasal Mucosa; Nasopharyngeal  Result Value Ref Range Status   MRSA by PCR NEGATIVE NEGATIVE Final    Comment:        The GeneXpert MRSA Assay (FDA approved for NASAL specimens only), is one component of a comprehensive MRSA colonization surveillance program. It is  not intended to diagnose MRSA infection nor to guide or monitor treatment for MRSA infections. Performed at [facility], [address] [place], [place] [address]   SARS Coronavirus 2 ([place] - Performed in [facility] hospital lab), Hosp Order     Status: None   Collection Time: 03/25/19  7:30 AM   Specimen: Nasopharyngeal Swab  Result Value Ref Range Status   SARS Coronavirus 2 NEGATIVE NEGATIVE Final    Comment: (NOTE) If result is NEGATIVE SARS-CoV-2 target nucleic acids are NOT DETECTED. The SARS-CoV-2 RNA is generally detectable in upper and lower  respiratory specimens during the acute phase of infection. The lowest  concentration of SARS-CoV-2 viral copies this assay can detect is 250  copies / mL. A negative result does not preclude SARS-CoV-2 infection  and should not be used as the sole basis for treatment or other  patient management decisions.  A negative result may occur with  improper specimen collection / handling, submission of specimen other  than nasopharyngeal swab, presence of viral mutation(s) within the  areas targeted by this assay, and inadequate number of viral copies  (<250 copies / mL). A negative result must be combined with clinical  observations, patient history, and epidemiological information. If result is POSITIVE SARS-CoV-2 target nucleic acids are DETECTED. The SARS-CoV-2 RNA is generally detectable in upper and lower  respiratory specimens dur ing the acute phase of infection.  Positive  results are indicative of active infection with SARS-CoV-2.  Clinical  correlation  with patient history and other diagnostic information is  necessary to determine patient infection status.  Positive results do  not rule out bacterial infection or co-infection with other viruses. If result is PRESUMPTIVE POSTIVE SARS-CoV-2 nucleic acids MAY BE PRESENT.   A presumptive positive result was obtained on the submitted specimen  and confirmed on repeat testing.  While 2019 novel coronavirus  (SARS-CoV-2) nucleic acids may be present in the submitted sample  additional confirmatory testing may be necessary for epidemiological  and / or clinical management purposes  to differentiate between  SARS-CoV-2 and other Sarbecovirus currently known to infect humans.  If clinically indicated additional testing with an alternate test  methodology [phone]) is advised. The SARS-CoV-2 RNA is generally  detectable in upper and lower respiratory sp ecimens during the acute  phase of infection. The expected result is Negative. Fact Sheet for Patients:  StrictlyIdeas.no Fact Sheet for Healthcare Providers: BankingDealers.co.za This test is not yet approved or cleared by the [place] FDA and has been authorized for detection and/or diagnosis of SARS-CoV-2 by FDA under an Emergency Use Authorization (EUA).  This EUA will remain in effect (meaning this test can be used) for the duration of the COVID-19 declaration under Section 564(b)(1) of the Act, 21 U.S.C. section 360bbb-3(b)(1), unless the authorization is terminated or revoked sooner. Performed at [facility], [address] [place], [place] [address]   Culture, blood (Routine X 2) w Reflex to ID Panel     Status: None (Preliminary result)   Collection Time: 03/25/19  8:46 AM   Specimen: BLOOD LEFT ARM  Result Value Ref Range Status   Specimen Description BLOOD LEFT ARM  Final   Special Requests   Final    BOTTLES DRAWN AEROBIC AND ANAEROBIC Blood Culture adequate volume   Culture    Final    NO GROWTH 2 DAYS Performed at [facility], [address], [place], [place] [address]    Report Status PENDING  Incomplete  Culture, blood (  Routine X 2) w Reflex to ID Panel     Status: None (Preliminary result)   Collection Time: 03/25/19  8:52 AM   Specimen: BLOOD LEFT ARM  Result Value Ref Range Status   Specimen Description BLOOD LEFT ARM  Final   Special Requests   Final    BOTTLES DRAWN AEROBIC AND ANAEROBIC Blood Culture adequate volume   Culture   Final    NO GROWTH 2 DAYS Performed at [facility], [address] [place], [place] [address]    Report Status PENDING  Incomplete  SARS Coronavirus 2     Status: None   Collection Time: 03/25/19  9:09 PM  Result Value Ref Range Status   SARS Coronavirus 2 NOT DETECTED NOT DETECTED Final    Comment: (NOTE) SARS-CoV-2 target nucleic acids are NOT DETECTED. The SARS-CoV-2 RNA is generally detectable in upper and lower respiratory specimens during the acute phase of infection.  Negative  results do not preclude SARS-CoV-2 infection, do not rule out co-infections with other pathogens, and should not be used as the sole basis for treatment or other patient management decisions.  Negative results must be combined with clinical observations, patient history, and epidemiological information. The expected result is Not Detected. Fact Sheet for Patients: http://www.biofiredefense.com/wp-content/uploads/2020/03/BIOFIRE-COVID -19-patients.pdf Fact Sheet for Healthcare Providers: http://www.biofiredefense.com/wp-content/uploads/2020/03/BIOFIRE-COVID -19-hcp.pdf This test is not yet approved or cleared by the [place] and  has been authorized for detection and/or diagnosis of SARS-CoV-2 by FDA under an Emergency Use Authorization (EUA).  This EUA will remain in effec t (meaning this test can be used) for the duration of  the COVID-19 declaration under Section 564(b)(1) of the Act, 21 U.S.C. section 360bbb-3(b)(1),  unless the authorization is terminated or revoked sooner. Performed at [facility], [place] [address], [place], [place] [address]      Labs: BNP (last 3 results) No results for input(s): BNP in the last 8760 hours. Basic Metabolic Panel: Recent Labs  Lab 03/24/19 1144 03/26/19 0846 03/27/19 0827 03/28/19 0858  NA 130* 131* 131* 133*  K 3.4* 3.0* 3.3* 4.0  CL 94* 98 95* 99  CO2 25 21* 24 25  GLUCOSE 152* 241* 215* 200*  BUN 14 9 8 11   CREATININE 0.82 0.88 0.88 0.85  CALCIUM 9.7 9.5 9.9 9.9   Liver Function Tests: Recent Labs  Lab 03/24/19 1144 03/26/19 0846 03/27/19 0827 03/28/19 0858  AST 35 46* 43* 36  ALT 37 37 38 36  ALKPHOS 64 59 62 61  BILITOT 1.0 1.0 1.0 0.6  PROT 6.6 6.0* 6.2* 5.8*  ALBUMIN 3.9 3.4* 3.6 3.3*   No results for input(s): LIPASE, AMYLASE in the last 168 hours. No results for input(s): AMMONIA in the last 168 hours. CBC: Recent Labs  Lab 03/24/19 1144 03/25/19 0847 03/26/19 0846  WBC 3.0* 1.9* 3.6*  NEUTROABS 2.0 1.1* 1.6*  HGB 12.4 11.7* 12.0  HCT 35.8* 34.8*  34.2 34.1*  MCV 90.9 93.0 88.8  PLT 152 137* 131*   Cardiac Enzymes: Recent Labs  Lab 03/25/19 0847  CKTOTAL 234   BNP: Invalid input(s): POCBNP CBG: Recent Labs  Lab 03/27/19 1132 03/27/19 1649 03/27/19 2201 03/28/19 0728 03/28/19 1148  GLUCAP 157* 141* 145* 133* 166*   D-Dimer No results for input(s): DDIMER in the last 72 hours. Hgb A1c No results for input(s): HGBA1C in the last 72 hours. Lipid Profile No results for input(s): CHOL, HDL, LDLCALC, TRIG, CHOLHDL, LDLDIRECT in the last 72 hours. Thyroid function studies  No results for input(s): TSH, T4TOTAL, T3FREE, THYROIDAB in the last 72 hours.  Invalid input(s): FREET3 Anemia work up No results for input(s): VITAMINB12, FOLATE, FERRITIN, TIBC, IRON, RETICCTPCT in the last 72 hours. Urinalysis    Component Value Date/Time   COLORURINE YELLOW 03/24/2019 1148   APPEARANCEUR CLEAR 03/24/2019 1148    LABSPEC 1.023 03/24/2019 1148   PHURINE 6.0 03/24/2019 1148   GLUCOSEU NEGATIVE 03/24/2019 1148   HGBUR NEGATIVE 03/24/2019 [place] 03/24/2019 1148   BILIRUBINUR neg 04/16/2015 0854   KETONESUR 20 (A) 03/24/2019 1148   PROTEINUR NEGATIVE 03/24/2019 1148   UROBILINOGEN negative 04/16/2015 0854   NITRITE NEGATIVE 03/24/2019 1148   LEUKOCYTESUR NEGATIVE 03/24/2019 1148   Sepsis Labs Invalid input(s): PROCALCITONIN,  WBC,  LACTICIDVEN Microbiology Recent Results (from the past 240 hour(s))  Novel Coronavirus,NAA,(SEND-OUT TO REF LAB - TAT 24-48 hrs); Hosp Order     Status: None   Collection Time: 03/24/19 11:51 AM   Specimen: Nasopharyngeal Swab; Respiratory  Result Value Ref Range Status   SARS-CoV-2, NAA NOT DETECTED NOT DETECTED Final    Comment: (NOTE) This test was developed and its performance characteristics determined by [employer]. This test has not been FDA cleared or approved. This test has been authorized by FDA under an Emergency Use Authorization (EUA). This test is only authorized for the duration of time the declaration that circumstances exist justifying the authorization of the emergency use of in vitro diagnostic tests for detection of SARS-CoV-2 virus and/or diagnosis of COVID-19 infection under section 564(b)(1) of the Act, 21 U.S.C. 626RSW-5(I)(6), unless the authorization is terminated or revoked sooner. When diagnostic testing is negative, the possibility of a false negative result should be considered in the context of a patient's recent exposures and the presence of clinical signs and symptoms consistent with COVID-19. An individual without symptoms of COVID-19 and who is not shedding SARS-CoV-2 virus would expect to have a negative (not detected) result in this assay. Performed  At: [facility] [address] [place], [place] [id number] Rush Farmer MD GH:[id number]    [place]  Final     Comment: Performed at [facility], [address] [place], [place] [address]  MRSA PCR Screening     Status: None   Collection Time: 03/24/19  5:32 PM   Specimen: Nasal Mucosa; Nasopharyngeal  Result Value Ref Range Status   MRSA by PCR NEGATIVE NEGATIVE Final    Comment:        The GeneXpert MRSA Assay (FDA approved for NASAL specimens only), is one component of a comprehensive MRSA colonization surveillance program. It is not intended to diagnose MRSA infection nor to guide or monitor treatment for MRSA infections. Performed at [facility], [address] [place], [place] [address]   SARS Coronavirus 2 ([place] - Performed in [facility] hospital lab), Hosp Order     Status: None   Collection Time: 03/25/19  7:30 AM   Specimen: Nasopharyngeal Swab  Result Value Ref Range Status   SARS Coronavirus 2 NEGATIVE NEGATIVE Final    Comment: (NOTE) If result is NEGATIVE SARS-CoV-2 target nucleic acids are NOT DETECTED. The SARS-CoV-2 RNA is generally detectable in upper and lower  respiratory specimens during the acute phase of infection. The lowest  concentration of SARS-CoV-2 viral copies this assay can detect is 250  copies / mL. A negative result does not preclude SARS-CoV-2 infection  and should not be used as the sole basis for treatment or other  patient management decisions.  A negative result may occur with  improper specimen collection / handling, submission of specimen other  than nasopharyngeal swab, presence of viral mutation(s) within the  areas targeted by this assay, and inadequate number of viral copies  (<250 copies / mL). A negative result must be combined with clinical  observations, patient history, and epidemiological information. If result is POSITIVE SARS-CoV-2 target nucleic acids are DETECTED. The SARS-CoV-2 RNA is generally detectable in upper and lower  respiratory specimens dur ing the acute phase of infection.  Positive  results are indicative  of active infection with SARS-CoV-2.  Clinical  correlation with patient history and other diagnostic information is  necessary to determine patient infection status.  Positive results do  not rule out bacterial infection or co-infection with other viruses. If result is PRESUMPTIVE POSTIVE SARS-CoV-2 nucleic acids MAY BE PRESENT.   A presumptive positive result was obtained on the submitted specimen  and confirmed on repeat testing.  While 2019 novel coronavirus  (SARS-CoV-2) nucleic acids may be present in the submitted sample  additional confirmatory testing may be necessary for epidemiological  and / or clinical management purposes  to differentiate between  SARS-CoV-2 and other Sarbecovirus currently known to infect humans.  If clinically indicated additional testing with an alternate test  methodology [phone]) is advised. The SARS-CoV-2 RNA is generally  detectable in upper and lower respiratory sp ecimens during the acute  phase of infection. The expected result is Negative. Fact Sheet for Patients:  StrictlyIdeas.no Fact Sheet for Healthcare Providers: BankingDealers.co.za This test is not yet approved or cleared by the [place] FDA and has been authorized for detection and/or diagnosis of SARS-CoV-2 by FDA under an Emergency Use Authorization (EUA).  This EUA will remain in effect (meaning this test can be used) for the duration of the COVID-19 declaration under Section 564(b)(1) of the Act, 21 U.S.C. section 360bbb-3(b)(1), unless the authorization is terminated or revoked sooner. Performed at [facility], [address] [place], [place] [address]   Culture, blood (Routine X 2) w Reflex to ID Panel     Status: None (Preliminary result)   Collection Time: 03/25/19  8:46 AM   Specimen: BLOOD LEFT ARM  Result Value Ref Range Status   Specimen Description BLOOD LEFT ARM  Final   Special Requests   Final    BOTTLES DRAWN  AEROBIC AND ANAEROBIC Blood Culture adequate volume   Culture   Final    NO GROWTH 2 DAYS Performed at [facility], [address] [place], [place] [address]    Report Status PENDING  Incomplete  Culture, blood (Routine X 2) w Reflex to ID Panel     Status: None (Preliminary result)   Collection Time: 03/25/19  8:52 AM   Specimen: BLOOD LEFT ARM  Result Value Ref Range Status   Specimen Description BLOOD LEFT ARM  Final   Special Requests   Final    BOTTLES DRAWN AEROBIC AND ANAEROBIC Blood Culture adequate volume   Culture   Final    NO GROWTH 2 DAYS Performed at [facility], [address], [place], [place] [address]    Report Status PENDING  Incomplete  SARS Coronavirus 2     Status: None   Collection Time: 03/25/19  9:09 PM  Result Value Ref Range Status   SARS Coronavirus 2 NOT DETECTED NOT DETECTED Final    Comment: (NOTE) SARS-CoV-2 target nucleic acids are NOT DETECTED. The SARS-CoV-2 RNA is generally detectable in upper  and lower respiratory specimens during the acute phase of infection.  Negative  results do not preclude SARS-CoV-2 infection, do not rule out co-infections with other pathogens, and should not be used as the sole basis for treatment or other patient management decisions.  Negative results must be combined with clinical observations, patient history, and epidemiological information. The expected result is Not Detected. Fact Sheet for Patients: http://www.biofiredefense.com/wp-content/uploads/2020/03/BIOFIRE-COVID -19-patients.pdf Fact Sheet for Healthcare Providers: http://www.biofiredefense.com/wp-content/uploads/2020/03/BIOFIRE-COVID -19-hcp.pdf This test is not yet approved or cleared by the [place] and  has been authorized for detection and/or diagnosis of SARS-CoV-2 by FDA under an Emergency Use Authorization (EUA).  This EUA will remain in effec t (meaning this test can be used) for the duration of  the COVID-19 declaration under  Section 564(b)(1) of the Act, 21 U.S.C. section 360bbb-3(b)(1), unless the authorization is terminated or revoked sooner. Performed at [facility], [place] [address] [place], [place] [address]    Time spent: 69mn  SIGNED:   SMarylu Lund MD  [facility] 03/28/2019, 4:04 PM  If 7PM-7AM, please contact night-coverage

## 2019-03-28 NOTE — Progress Notes (Signed)
TCD and Carotid duplex has been completed.   Preliminary results in CV Proc.   Abram Sander 03/28/2019 10:09 AM

## 2019-03-29 ENCOUNTER — Telehealth: Payer: Self-pay | Admitting: Family Medicine

## 2019-03-29 LAB — PATHOLOGIST SMEAR REVIEW: Path Review: 6222020

## 2019-03-29 NOTE — Telephone Encounter (Signed)
Calling to let me know about recent stroke

## 2019-03-30 LAB — CULTURE, BLOOD (ROUTINE X 2)
Culture: NO GROWTH
Culture: NO GROWTH
Special Requests: ADEQUATE
Special Requests: ADEQUATE

## 2019-03-31 ENCOUNTER — Other Ambulatory Visit: Payer: Self-pay

## 2019-03-31 ENCOUNTER — Ambulatory Visit: Payer: Medicare Other | Attending: Internal Medicine | Admitting: Physical Therapy

## 2019-03-31 ENCOUNTER — Encounter: Payer: Self-pay | Admitting: Physical Therapy

## 2019-03-31 DIAGNOSIS — R262 Difficulty in walking, not elsewhere classified: Secondary | ICD-10-CM | POA: Diagnosis not present

## 2019-03-31 DIAGNOSIS — R2681 Unsteadiness on feet: Secondary | ICD-10-CM | POA: Insufficient documentation

## 2019-03-31 NOTE — Therapy (Signed)
[facility] [place], [place], [address] Phone: [phone]   Fax:  [phone]  Physical Therapy Evaluation  Patient Details  Name: Theresa Watson MRN: [id number] Date of Birth: 14-Jul-1933 Referring Provider (PT): Donne Hazel, MD   Encounter Date: 03/31/2019  PT End of Session - 03/31/19 1629    Visit Number  1    Number of Visits  12    Date for PT Re-Evaluation  05/19/19    Authorization Type  Progress note every 10th visit    PT Start Time  1117    PT Stop Time  1201    PT Time Calculation (min)  44 min    Activity Tolerance  Patient tolerated treatment well    Behavior During Therapy  [facility] for tasks assessed/performed       Past Medical History:  Diagnosis Date  . Colon polyp   . Diabetes mellitus without complication ([place])   . Diverticulosis   . Esophagitis   . IBS (irritable bowel syndrome)   . Kidney stone    hx of 1 kidney stone  . Lumbosacral spondylosis without myelopathy   . Obesity, mild   . Other and unspecified hyperlipidemia   . Pain in joint, shoulder region   . 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 Symptomatic menopausal or female climacteric states     Past Surgical History:  Procedure Laterality Date  . CARPAL TUNNEL RELEASE    . CHOLECYSTECTOMY    . EYE SURGERY Bilateral    cataracts  . lumbar back surgery    . VAGINAL HYSTERECTOMY     partial    There were no vitals filed for this visit.   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 Patient's daughter in law stated a neighbor checked up on her after she did not answer her phone and she found the patient confused and unable to find her words. 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 Patient was transferred to [facility] and was discharged on 03/28/2019. Patient's daughter in law states prior to the stroke she lived alone but states she and her husband are staying with her for support. Patient requires assistance for showers but has a shower bench. Family has prohibited her from walking outside for her safety. Patient's goals are to improve mobility, improve function, and confidence to return to living alone.    Patient is accompained by:  Family member   Daughter in law   Pertinent History  CVA 03/24/2019, HTN, DM    Limitations  Walking;House hold activities    Diagnostic tests  MRI: right thalmus CVA    Patient Stated Goals  become independent again    Currently in Pain?  No/denies         [facility] PT Assessment - 03/31/19 0001      Assessment   Medical Diagnosis  Unsteadiness on feet, acute ichemic stroke    Referring Provider (PT)  Donne Hazel, MD    Onset Date/Surgical Date  03/24/19    Hand Dominance  Right    Next MD Visit  n/a    Prior Therapy  per daughter in law, in the hospital  Precautions   Precautions  Fall      Restrictions   Weight Bearing Restrictions  No      Balance Screen   Has the patient fallen in the past 6 months  No    Has the patient had a decrease in activity level because of a fear of falling?   No    Is the patient reluctant to leave their home because of a fear of falling?   No      Home Environment   Living Environment  Private residence    Living Arrangements  Alone   family lives close by with support from neighbors     Prior Function   Level of Independence  Independent with household mobility without device;Needs assistance with ADLs;Needs assistance with homemaking    Comments  requires assistance for showering tasks and meal preparations at this time      ROM / Strength   AROM / PROM / Strength  Strength      Strength   Strength Assessment Site  Hip;Knee;Shoulder     Right/Left Shoulder  Right;Left    Right Shoulder Flexion  4/5    Right Shoulder ABduction  4-/5    Right Shoulder Internal Rotation  4+/5    Right Shoulder External Rotation  4+/5    Left Shoulder Flexion  4-/5    Left Shoulder ABduction  4-/5    Left Shoulder Internal Rotation  4+/5    Left Shoulder External Rotation  4+/5    Right/Left Hip  Right;Left    Right Hip Flexion  4/5    Right Hip ABduction  4-/5    Right Hip ADduction  4-/5    Left Hip Flexion  4-/5    Left Hip ABduction  4-/5    Left Hip ADduction  4-/5    Right/Left Knee  Right;Left    Right Knee Flexion  4/5    Right Knee Extension  4+/5    Left Knee Flexion  4/5    Left Knee Extension  4/5      Transfers   Five time sit to stand comments   modified 5x sit to stand test 20 seconds      Ambulation/Gait   Gait Pattern  Step-to pattern;Decreased step length - right;Decreased step length - left;Decreased stride length;Decreased hip/knee flexion - right;Decreased hip/knee flexion - left;Shuffle;Trunk flexed      Balance   Balance Assessed  No      Standardized Balance Assessment   Standardized Balance Assessment  Berg Balance Test      Berg Balance Test   Sit to Stand  Able to stand  independently using hands    Standing Unsupported  Able to stand 2 minutes with supervision    Sitting with Back Unsupported but Feet Supported on Floor or Stool  Able to sit safely and securely 2 minutes    Stand to Sit  Controls descent by using hands    Transfers  Able to transfer safely, definite need of hands    Standing Unsupported with Eyes Closed  Able to stand 10 seconds with supervision    Standing Unsupported with Feet Together  Able to place feet together independently and stand for 1 minute with supervision    From Standing, Reach Forward with Outstretched Arm  Can reach forward >12 cm safely (5")    From Standing Position, Pick up Object from Floor  Able to pick up shoe, needs supervision  Objective measurements completed on examination: See above findings.              PT Education - 03/31/19 1642    Education Details  marching, heel raises, toe raises, hip abduction    Person(s) Educated  Patient;Caregiver(s)    Methods  Demonstration;Handout;Explanation    Comprehension  Verbalized understanding;Returned demonstration          PT Long Term Goals - 03/31/19 1646      PT LONG TERM GOAL #1   Title  Patient will be independent HEP    Time  6    Period  Weeks    Status  New      PT LONG TERM GOAL #2   Title  Patient will improve LE functional strength as noted by the ability to perform modified 5x sit to stand test to 16 seconds.    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 Patient noted with good to good + UE MMT. Patient's modified 5x sit to stand test score of 20 seconds categorizes her as a fall risk with decreased LE functional strength. Patient ambulates with decreased gait speed, shuffling gait pattern, and decreased stride length bilaterally. Patient noted with a lack focus during subjective portion and required redirection for some tasks. Patient reviewed HEP and educated on importance of having someone home while she is performing due to fall risk. Patient and patient's daughter reported understanding.  Patient would benefit from skilled physical therapy to address deficits and address patient's goals.    Personal Factors and Comorbidities  Age;Comorbidity 3+    Comorbidities  CVA 03/24/2019,  HTN, DM    Examination-Activity Limitations  Bathing;Dressing;Hygiene/Grooming;Stairs    Stability/Clinical Decision Making  Stable/Uncomplicated    Clinical Decision Making  Moderate    Rehab Potential  Good    PT Frequency  2x / week    PT Duration  6 weeks    PT Treatment/Interventions  Neuromuscular re-education;Gait training;Stair training;Functional mobility training;Therapeutic exercise;Balance training;Therapeutic activities;Patient/family education;Passive range of motion;Manual techniques;ADLs/Self Care Home Management    PT Next Visit Plan  nustep, complete Berg balance scale, strengthening of LE and UEs    PT Home Exercise Plan  see patient education section    Consulted and Agree with Plan of Care  Patient;Family member/caregiver    Family Member Consulted  daughter-in-law       Patient will benefit from skilled therapeutic intervention in order to improve the following deficits and impairments:  Abnormal gait, Decreased safety awareness, Decreased balance, Decreased activity tolerance, Decreased strength  Visit Diagnosis: 1. Unsteadiness on feet   2. Difficulty in walking, not elsewhere classified        Problem List Patient Active Problem List   Diagnosis Date Noted  . Acute ischemic stroke ([place]) 03/25/2019  . Acute metabolic encephalopathy 27/25/3664  . Hyponatremia 03/24/2019  . Diabetes  mellitus due to underlying condition with diabetic autonomic neuropathy, without long-term current use of insulin ([place]) 09/18/2015  . Annual physical exam 02/26/2015  . Type 2 diabetes mellitus with neurological manifestations, controlled ([place]) 08/29/2013  . Hypertension 11/13/2012  . Cataracts, bilateral 11/13/2012  . Symptomatic menopausal or female climacteric states   . Hyperlipidemia associated with type 2 diabetes mellitus ([place])   . Esophagitis   . 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 Obesity, mild   . IBS (irritable bowel syndrome)   .  Kidney stone   . Lumbosacral spondylosis without myelopathy   . Pain in joint, shoulder region   . Colon polyp   . Diverticulosis    Gabriela Eves, PT, DPT 03/31/2019, 4:55 PM  [facility] [address] [place], [place], [address] Phone: [phone]   Fax:  [phone]  Name: Theresa Watson MRN: [id number] Date of Birth: November 13, 1932

## 2019-04-01 ENCOUNTER — Ambulatory Visit (INDEPENDENT_AMBULATORY_CARE_PROVIDER_SITE_OTHER): Payer: Medicare Other | Admitting: Family Medicine

## 2019-04-01 DIAGNOSIS — F419 Anxiety disorder, unspecified: Secondary | ICD-10-CM | POA: Diagnosis not present

## 2019-04-01 DIAGNOSIS — G4701 Insomnia due to medical condition: Secondary | ICD-10-CM | POA: Diagnosis not present

## 2019-04-01 MED ORDER — TRAZODONE HCL 50 MG PO TABS: 50.0000 mg | ORAL_TABLET | Freq: Every day | ORAL | 2 refills | Status: DC

## 2019-04-01 MED ORDER — DULOXETINE HCL 30 MG PO CPEP: 30.0000 mg | ORAL_CAPSULE | Freq: Every day | ORAL | 0 refills | Status: DC

## 2019-04-01 NOTE — Progress Notes (Signed)
Subjective:    Patient ID: Theresa Watson, female    DOB: 09-26-1933, 85 y.o.   MRN: [id number]   HPI: Theresa Watson is a 85 y.o. female presenting for recent small stroke. Getting upset easily. Not sleeping at night. Worries a lot and can't control it. Fears further illness. Increasing since her stroke.  Depression screen [facility] 2/9 04/05/2019 04/04/2019 10/14/2018 07/05/2018 05/19/2018  Decreased Interest 0 2 1 0 1  Down, Depressed, Hopeless 0 3 1 0 0  PHQ - 2 Score 0 5 2 0 1  Altered sleeping - 1 1 - -  Tired, decreased energy - 1 1 - -  Change in appetite - 0 0 - -  Feeling bad or failure about yourself  - 0 0 - -  Trouble concentrating - 0 0 - -  Moving slowly or fidgety/restless - 1 1 - -  Suicidal thoughts - 0 1 - -  PHQ-9 Score - 8 6 - -  Difficult doing work/chores - Somewhat difficult - - -     Relevant past medical, surgical, family and social history reviewed and updated as indicated.  Interim medical history since our last visit reviewed. Allergies and medications reviewed and updated.  ROS:  Review of Systems  Constitutional: Negative.   HENT: Negative.   Eyes: Negative for visual disturbance.  Respiratory: Negative for shortness of breath.   Cardiovascular: Negative for chest pain.  Gastrointestinal: Negative for abdominal pain.  Musculoskeletal: Negative for arthralgias.  Psychiatric/Behavioral: Positive for sleep disturbance. Negative for agitation. The patient is nervous/anxious.      Social History   Tobacco Use  Smoking Status Former Smoker  . Packs/day: 0.50  . Types: Cigarettes  . Start date: 10/06/1953  . Quit date: 02/04/2012  . Years since quitting: 7.1  Smokeless Tobacco Never Used       Objective:     Wt Readings from Last 3 Encounters:  03/25/19 152 lb 12.5 oz (69.3 kg)  10/14/18 155 lb (70.3 kg)  07/05/18 153 lb 3.2 oz (69.5 kg)     Exam deferred. Pt. Harbor[place] due to COVID 19. Phone visit performed.   Assessment & Plan:   1.  Anxiety   2. Insomnia due to medical condition     Meds ordered this encounter  Medications  . DULoxetine (CYMBALTA) 30 MG capsule    Sig: Take 1 capsule (30 mg total) by mouth daily.    Dispense:  30 capsule    Refill:  0  . traZODone (DESYREL) 50 MG tablet    Sig: Take 1 tablet (50 mg total) by mouth at bedtime. Use from 1/3 to 1 tablet nightly as needed for sleep.    Dispense:  30 tablet    Refill:  2    No orders of the defined types were placed in this encounter.     Diagnoses and all orders for this visit:  Anxiety  Insomnia due to medical condition  Other orders -     DULoxetine (CYMBALTA) 30 MG capsule; Take 1 capsule (30 mg total) by mouth daily. -     traZODone (DESYREL) 50 MG tablet; Take 1 tablet (50 mg total) by mouth at bedtime. Use from 1/3 to 1 tablet nightly as needed for sleep.    Virtual Visit via telephone Note  I discussed the limitations, risks, security and privacy concerns of performing an evaluation and management service by telephone and the availability of in person appointments. The patient was identified with  two identifiers. Pt.expressed understanding and agreed to proceed. Pt. Is at home. Dr. Livia Snellen is in his office.  Follow Up Instructions:   I discussed the assessment and treatment plan with the patient. The patient was provided an opportunity to ask questions and all were answered. The patient agreed with the plan and demonstrated an understanding of the instructions.   The patient was advised to call back or seek an in-person evaluation if the symptoms worsen or if the condition fails to improve as anticipated.   Total minutes including chart review and phone contact time: 16   Follow up plan: Return if symptoms worsen or fail to improve.  Claretta Fraise, MD [place]

## 2019-04-04 ENCOUNTER — Other Ambulatory Visit: Payer: Self-pay

## 2019-04-04 ENCOUNTER — Ambulatory Visit: Payer: Medicare Other | Admitting: Physical Therapy

## 2019-04-04 ENCOUNTER — Other Ambulatory Visit: Payer: Self-pay | Admitting: *Deleted

## 2019-04-04 ENCOUNTER — Encounter: Payer: Self-pay | Admitting: Physical Therapy

## 2019-04-04 ENCOUNTER — Ambulatory Visit (INDEPENDENT_AMBULATORY_CARE_PROVIDER_SITE_OTHER): Payer: Medicare Other | Admitting: *Deleted

## 2019-04-04 ENCOUNTER — Encounter: Payer: Self-pay | Admitting: *Deleted

## 2019-04-04 DIAGNOSIS — Z0000 Encounter for general adult medical examination without abnormal findings: Secondary | ICD-10-CM

## 2019-04-04 DIAGNOSIS — R2681 Unsteadiness on feet: Secondary | ICD-10-CM | POA: Diagnosis not present

## 2019-04-04 DIAGNOSIS — R262 Difficulty in walking, not elsewhere classified: Secondary | ICD-10-CM

## 2019-04-04 NOTE — Patient Instructions (Signed)
Preventive Care 38 Years and Older, Female Preventive care refers to lifestyle choices and visits with your health care provider that can promote health and wellness. This includes:  A yearly physical exam. This is also called an annual well check.  Regular dental and eye exams.  Immunizations.  Screening for certain conditions.  Healthy lifestyle choices, such as diet and exercise. What can I expect for my preventive care visit? Physical exam Your health care provider will check:  Height and weight. These may be used to calculate body mass index (BMI), which is a measurement that tells if you are at a healthy weight.  Heart rate and blood pressure.  Your skin for abnormal spots. Counseling Your health care provider may ask you questions about:  Alcohol, tobacco, and drug use.  Emotional well-being.  Home and relationship well-being.  Sexual activity.  Eating habits.  History of falls.  Memory and ability to understand (cognition).  Work and work Statistician.  Pregnancy and menstrual history. What immunizations do I need?  Influenza (flu) vaccine  This is recommended every year. Tetanus, diphtheria, and pertussis (Tdap) vaccine  You may need a Td booster every 10 years. Varicella (chickenpox) vaccine  You may need this vaccine if you have not already been vaccinated. Zoster (shingles) vaccine  You may need this after age 33. Pneumococcal conjugate (PCV13) vaccine  One dose is recommended after age 33. Pneumococcal polysaccharide (PPSV23) vaccine  One dose is recommended after age 72. Measles, mumps, and rubella (MMR) vaccine  You may need at least one dose of MMR if you were born in 1957 or later. You may also need a second dose. Meningococcal conjugate (MenACWY) vaccine  You may need this if you have certain conditions. Hepatitis A vaccine  You may need this if you have certain conditions or if you travel or work in places where you may be exposed  to hepatitis A. Hepatitis B vaccine  You may need this if you have certain conditions or if you travel or work in places where you may be exposed to hepatitis B. Haemophilus influenzae type b (Hib) vaccine  You may need this if you have certain conditions. You may receive vaccines as individual doses or as more than one vaccine together in one shot (combination vaccines). Talk with your health care provider about the risks and benefits of combination vaccines. What tests do I need? Blood tests  Lipid and cholesterol levels. These may be checked every 5 years, or more frequently depending on your overall health.  Hepatitis C test.  Hepatitis B test. Screening  Lung cancer screening. You may have this screening every year starting at age 39 if you have a 30-pack-year history of smoking and currently smoke or have quit within the past 15 years.  Colorectal cancer screening. All adults should have this screening starting at age 36 and continuing until age 15. Your health care provider may recommend screening at age 23 if you are at increased risk. You will have tests every 1-10 years, depending on your results and the type of screening test.  Diabetes screening. This is done by checking your blood sugar (glucose) after you have not eaten for a while (fasting). You may have this done every 1-3 years.  Mammogram. This may be done every 1-2 years. Talk with your health care provider about how often you should have regular mammograms.  BRCA-related cancer screening. This may be done if you have a family history of breast, ovarian, tubal, or peritoneal cancers.  Other tests  Sexually transmitted disease (STD) testing.  Bone density scan. This is done to screen for osteoporosis. You may have this done starting at age 76. Follow these instructions at home: Eating and drinking  Eat a diet that includes fresh fruits and vegetables, whole grains, lean protein, and low-fat dairy products. Limit  your intake of foods with high amounts of sugar, saturated fats, and salt.  Take vitamin and mineral supplements as recommended by your health care provider.  Do not drink alcohol if your health care provider tells you not to drink.  If you drink alcohol: ? Limit how much you have to 0-1 drink a day. ? Be aware of how much alcohol is in your drink. In the [place] one drink equals one 12 oz bottle of beer (355 mL), one 5 oz glass of wine (148 mL), or one 1 oz glass of hard liquor (44 mL). Lifestyle  Take daily care of your teeth and gums.  Stay active. Exercise for at least 30 minutes on 5 or more days each week.  Do not use any products that contain nicotine or tobacco, such as cigarettes, e-cigarettes, and chewing tobacco. If you need help quitting, ask your health care provider.  If you are sexually active, practice safe sex. Use a condom or other form of protection in order to prevent STIs (sexually transmitted infections).  Talk with your health care provider about taking a low-dose aspirin or statin. What's next?  Go to your health care provider once a year for a well check visit.  Ask your health care provider how often you should have your eyes and teeth checked.  Stay up to date on all vaccines. This information is not intended to replace advice given to you by your health care provider. Make sure you discuss any questions you have with your health care provider. Document Released: 10/19/2015 Document Revised: 09/16/2018 Document Reviewed: 09/16/2018 Elsevier Patient Education  2020 [employer].

## 2019-04-04 NOTE — Patient Outreach (Signed)
  [facility]) Care Management  04/04/2019  Theresa Watson 07/02/1933 [id number]   EMMI- stroke d/c from [facility] Date: Saturday April 02 2019 1001   Red Alert Reason: Questions/problems with meds? Yes   Insurance: united [employer] care medicare   [facility] admissions x  1 ED visits x 1  in the last 6 months  Last admission to [facility] for acute metabolic encephalopathy, IBS HTN  On 03/23/09 to 03/28/19     Outreach attempt # 1 No answer. [facility] RN CM left HIPAA compliant voicemail message along with CM's contact info.   Plan: [facility] RN CM sent an unsuccessful outreach letter and scheduled this patient for another call attempt within 4 business days  [name], RN, BSN, [place] Coordinator Office number [phone] Mobile number [phone]  Main [facility] number [phone] Fax number [phone]

## 2019-04-04 NOTE — Therapy (Signed)
[facility] [place], [place], [address] Phone: [phone]   Fax:  [phone]  Physical Therapy Treatment  Patient Details  Name: Theresa Watson MRN: [id number] Date of Birth: Jan 29, 1933 Referring Provider (PT): Donne Hazel, MD   Encounter Date: 04/04/2019  PT End of Session - 04/04/19 1302    Visit Number  2    Number of Visits  12    Date for PT Re-Evaluation  05/19/19    Authorization Type  Progress note every 10th visit    PT Start Time  1300    PT Stop Time  1342    PT Time Calculation (min)  42 min    Activity Tolerance  Patient tolerated treatment well    Behavior During Therapy  [facility] for tasks assessed/performed       Past Medical History:  Diagnosis Date  . Colon polyp   . Diabetes mellitus without complication ([place])   . Diverticulosis   . Esophagitis   . IBS (irritable bowel syndrome)   . Kidney stone    hx of 1 kidney stone  . Lumbosacral spondylosis without myelopathy   . Obesity, mild   . Other and unspecified hyperlipidemia   . Pain in joint, shoulder region   . 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 Stroke ([place])   . Symptomatic menopausal or female climacteric states     Past Surgical History:  Procedure Laterality Date  . CARPAL TUNNEL RELEASE    . CHOLECYSTECTOMY    . EYE SURGERY Bilateral    cataracts  . lumbar back surgery    . VAGINAL HYSTERECTOMY     partial    There were no vitals filed for this visit.  Subjective Assessment - 04/04/19 1302    Subjective  COVID-19 screening performed prior to patient entering the building. Patient arrives feeling "fine". Per daughter in law, she reports she has been compliant with HEP.    Patient is accompained by:  Family member    Pertinent History  CVA 03/24/2019, HTN, DM    Limitations  Walking;House hold activities    Diagnostic tests  MRI: right thalmus CVA    Patient Stated Goals  become independent again     Currently in Pain?  No/denies         [facility] PT Assessment - 04/04/19 0001      Assessment   Medical Diagnosis  Unsteadiness on feet, acute ichemic stroke    Referring Provider (PT)  Donne Hazel, MD    Onset Date/Surgical Date  03/24/19    Hand Dominance  Right    Next MD Visit  n/a    Prior Therapy  per daughter in law, in the hospital      Precautions   Precautions  Fall      Berg Balance Test   Sit to Stand  Able to stand  independently using hands    Standing Unsupported  Able to stand 2 minutes with supervision    Sitting with Back Unsupported but Feet Supported on Floor or Stool  Able to sit safely and securely 2 minutes    Stand to Sit  Controls descent by using hands    Transfers  Able to transfer safely, definite need of hands    Standing Unsupported with Eyes Closed  Able to stand 10 seconds with supervision    Standing Unsupported with Feet Together  Able to place feet together independently and stand for  1 minute with supervision    From Standing, Reach Forward with Outstretched Arm  Can reach forward >12 cm safely (5")    From Standing Position, Pick up Object from [place] to pick up shoe, needs supervision    From Standing Position, Turn to Look Behind Over each Shoulder  Turn sideways only but maintains balance    Turn 360 Degrees  Able to turn 360 degrees safely but slowly    Standing Unsupported, Alternately Place Feet on Step/Stool  Able to complete 4 steps without aid or supervision    Standing Unsupported, One Foot in Front  Needs help to step but can hold 15 seconds    Standing on One Leg  Unable to try or needs assist to prevent fall    Total Score  35                   [facility] Adult PT Treatment/Exercise - 04/04/19 0001      Exercises   Exercises  Knee/Hip;Shoulder      Knee/Hip Exercises: Aerobic   Nustep  Level 3 x12 minutes      Knee/Hip Exercises: Seated   Long Arc Quad  Strengthening;Both;2 sets;10 reps    Long Arc Quad  Weight  1 lbs.    Ball Squeeze  x2 minutes    Clamshell with TheraBand  Yellow   x20   Marching  AROM;Both;Other (comment)   2 mins   Hamstring Curl  Strengthening;20 reps    Hamstring Limitations  yellow     Sit to [employer]  2 sets;5 reps      Shoulder Exercises: Seated   Row  Strengthening;Both    Theraband Level (Shoulder Row)  Level 1 (Yellow)          Balance Exercises - 04/04/19 1452      Balance Exercises: Standing   Sidestepping  1 rep    Marching Limitations  marching walk down carpeted hall way 30 feet with close supervision and intermittent contact guard             PT Long Term Goals - 03/31/19 1646      PT LONG TERM GOAL #1   Title  Patient will be independent HEP    Time  6    Period  Weeks    Status  New      PT LONG TERM GOAL #2   Title  Patient will improve LE functional strength as noted by the ability to perform modified 5x sit to stand test to 16 seconds.    Time  6    Period  Weeks    Status  New      PT LONG TERM GOAL #3   Title  Patient will demonstrate LE strength to 4+/5 or greater to improve stability during functional tasks    Time  6    Period  Weeks    Status  New            Plan - 04/04/19 1353    Clinical Impression Statement  Patient was able to tolerate treatment well but frequently took rest breaks. Patient needed motivation to complete the remaining exercises. Patient required verbal and tactile cuing for proper form with all exercises. Patient required close supervision for balance activities. Patient's berg balance score of 35/56 categorizes her as a moderate fall risk. Patient and patient's daughter in law instructed to continue HEP at home. Patient's daughter reported understanding.    Personal Factors and Comorbidities  Age;Comorbidity 3+    Comorbidities  CVA 03/24/2019, HTN, DM    Examination-Activity Limitations  Bathing;Dressing;Hygiene/Grooming;Stairs    Stability/Clinical Decision Making  Stable/Uncomplicated     Clinical Decision Making  Moderate    Rehab Potential  Good    PT Frequency  2x / week    PT Duration  6 weeks    PT Treatment/Interventions  Neuromuscular re-education;Gait training;Stair training;Functional mobility training;Therapeutic exercise;Balance training;Therapeutic activities;Patient/family education;Passive range of motion;Manual techniques;ADLs/Self Care Home Management    PT Next Visit Plan  nustep, strengthening of LE and UEs, core stability.    Consulted and Agree with Plan of Care  Patient;Family member/caregiver    Family Member Consulted  daughter-in-law       Patient will benefit from skilled therapeutic intervention in order to improve the following deficits and impairments:  Abnormal gait, Decreased safety awareness, Decreased balance, Decreased activity tolerance, Decreased strength  Visit Diagnosis: 1. Unsteadiness on feet   2. Difficulty in walking, not elsewhere classified        Problem List Patient Active Problem List   Diagnosis Date Noted  . Acute ischemic stroke ([place]) 03/25/2019  . Acute metabolic encephalopathy 40/98/1191  . Hyponatremia 03/24/2019  . Diabetes mellitus due to underlying condition with diabetic autonomic neuropathy, without long-term current use of insulin ([place]) 09/18/2015  . Annual physical exam 02/26/2015  . Type 2 diabetes mellitus with neurological manifestations, controlled ([place]) 08/29/2013  . Hypertension 11/13/2012  . Cataracts, bilateral 11/13/2012  . Symptomatic menopausal or female climacteric states   . Hyperlipidemia associated with type 2 diabetes mellitus ([place])   . Esophagitis   . 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 Obesity, mild   . IBS (irritable bowel syndrome)   . Kidney stone   . Lumbosacral spondylosis without myelopathy   . Pain in joint, shoulder region   . Colon polyp   . Diverticulosis    Gabriela Eves, PT, DPT 04/04/2019, 2:53 PM  [facility] [address] [place], [place], [address] Phone: [phone]   Fax:  [phone]  Name: Theresa Watson MRN: [id number] Date of Birth: 11/19/1932

## 2019-04-04 NOTE — Progress Notes (Signed)
MEDICARE ANNUAL WELLNESS VISIT  04/04/2019  Telephone Visit Disclaimer This Medicare AWV was conducted by telephone due to national recommendations for restrictions regarding the COVID-19 Pandemic (e.g. social distancing).  I verified, using two identifiers, that I am speaking with Theresa Watson or their authorized healthcare agent. I discussed the limitations, risks, security, and privacy concerns of performing an evaluation and management service by telephone and the potential availability of an in-person appointment in the future. The patient expressed understanding and agreed to proceed.   Subjective:  Theresa Watson is a 85 y.o. female patient of Theresa Herb, MD who had a Medicare Annual Wellness Visit today via telephone. Theresa Watson is Retired and lives alone. she has 5 children. she reports that she is socially active and does interact with friends/family regularly. she is minimally physically active and enjoys doing word search puzzles.  Patient Care Team: Theresa Herb, MD as PCP - General (Family Medicine) Clarene Essex, MD as Consulting Physician (Gastroenterology) Allyn Kenner, MD (Dermatology) Harlen Labs, MD as Referring Physician (Optometry) Barbaraann Faster, RN as [place] Management  Advanced Directives 04/04/2019 03/31/2019 03/24/2019 03/24/2019 02/02/2017  Does Patient Have a Medical Advance Directive? No Yes Yes Yes No  Type of Advance Directive - - [place];Living will [place];Living will -  Does patient want to make changes to medical advance directive? No - Patient declined - No - Patient declined - -  Copy of [place] in Chart? - - No - copy requested No - copy requested -  Would patient like information on creating a medical advance directive? No - Patient declined - No - Patient declined No - Patient declined Yes (MAU/Ambulatory/Procedural Areas - Information given)     Hospital Utilization Over the Past 12 Months: # of hospitalizations or ER visits: 1 # of surgeries: 0  Review of Systems    Patient reports that her overall health is unchanged compared to last year.  Patient Reported Readings (BP, Pulse, CBG, Weight, etc) none  Review of Systems: No complaints  All other systems negative.  Pain Assessment Pain : No/denies pain     Current Medications & Allergies (verified) Allergies as of 04/04/2019      Reactions   Levaquin [levofloxacin] Swelling   tongue   Cephalexin    Penicillins    .Did it involve swelling of the face/tongue/throat, SOB, or low BP? Unknown Did it involve sudden or severe rash/hives, skin peeling, or any reaction on the inside of your mouth or nose? Unknown Did you need to seek medical attention at a hospital or doctor's office? Unknown When did it last happen? If all above answers are NO, may proceed with cephalosporin use.   Lisinopril Cough      Medication List       Accurate as of April 04, 2019 10:58 AM. If you have any questions, ask your nurse or doctor.        Accu-Chek Aviva Plus test strip Generic drug: glucose blood TEST TWO TIMES DAILY AS  DIRECTED   aspirin 81 MG EC tablet Take 81 mg by mouth daily.   CALCIUM 600 + D PO Take 1 tablet by mouth daily.   DULoxetine 30 MG capsule Commonly known as: Cymbalta Take 1 capsule (30 mg total) by mouth daily.   ferrous sulfate 325 (65 FE) MG EC tablet Take 325 mg by mouth daily with breakfast.   Fish Oil  1000 MG Caps Take 2 capsules by mouth daily.   gabapentin 100 MG capsule Commonly known as: NEURONTIN Take 1 cap PO in the AM and 3 caps po QHS   Garlic 941 MG Tabs Take 1 tablet by mouth 2 (two) times daily.   geriatric multivitamins-minerals Elix Take 15 mLs by mouth daily.   nystatin cream Commonly known as: MYCOSTATIN Apply topically 2 (two) times daily. To affected areas   omeprazole 20 MG capsule Commonly known as:  PRILOSEC Take 20 mg by mouth daily.   potassium chloride SA 20 MEQ tablet Commonly known as: K-DUR TAKE 1 TABLET BY MOUTH  DAILY   pravastatin 80 MG tablet Commonly known as: PRAVACHOL TAKE 1 TABLET BY MOUTH  DAILY   traZODone 50 MG tablet Commonly known as: DESYREL Take 1 tablet (50 mg total) by mouth at bedtime. Use from 1/3 to 1 tablet nightly as needed for sleep.   triamterene-hydrochlorothiazide 37.5-25 MG tablet Commonly known as: MAXZIDE-25 TAKE 1 TABLET BY MOUTH  DAILY   vitamin C 500 MG tablet Commonly known as: ASCORBIC ACID Take 500 mg by mouth daily.   Vitamin D3 50 MCG (2000 UT) Tabs Take 1 tablet by mouth daily.   VITAMIN E PO Take 1 capsule by mouth daily.       History (reviewed): Past Medical History:  Diagnosis Date   Colon polyp    Diabetes mellitus without complication (HCC)    Diverticulosis    Esophagitis    IBS (irritable bowel syndrome)    Kidney stone    hx of 1 kidney stone   Lumbosacral spondylosis without myelopathy    Obesity, mild    Other and unspecified hyperlipidemia    Pain in joint, shoulder region    Prolapse of vaginal walls without mention of uterine prolapse    Stroke ([place])    Symptomatic menopausal or female climacteric states    Past Surgical History:  Procedure Laterality Date   CARPAL TUNNEL RELEASE     CHOLECYSTECTOMY     EYE SURGERY Bilateral    cataracts   lumbar back surgery     VAGINAL HYSTERECTOMY     partial   Family History  Problem Relation Age of Onset   Cancer Mother        brain   Brain cancer Mother    GI Bleed Father    Arthritis Sister        rheumatoid   Lymphoma Sister    Pancreatic cancer Brother    Diabetes Son    Arthritis Son        back   Cancer Son        prostate   Cancer Maternal Grandfather    Suicidality Paternal Grandfather    Arthritis Son        back   Hyperlipidemia Son    Colitis Son    Social History   Socioeconomic History    Marital status: Divorced    Spouse name: Not on file   Number of children: 5   Years of education: GED   Highest education level: GED or equivalent  Occupational History   Occupation: retired  Scientist, product/process development strain: Not hard at [employer]    Worry: Never true    Inability: Never true   Transportation needs    Medical: No    Non-medical: No  Tobacco Use   Smoking status: Former Smoker    Packs/day: 0.50  Types: Cigarettes    Start date: 10/06/1953    Quit date: 02/04/2012    Years since quitting: 7.1   Smokeless tobacco: Never Used  Substance and Sexual Activity   Alcohol use: No   Drug use: No   Sexual activity: Not Currently  Lifestyle   Physical activity    Days per week: 7 days    Minutes per session: 20 min   Stress: To some extent  Relationships   Social connections    Talks on phone: More than three times a week    Gets together: More than three times a week    Attends religious service: More than 4 times per year    Active member of club or organization: Yes    Attends meetings of clubs or organizations: More than 4 times per year    Relationship status: Divorced  Other Topics Concern   Not on file  Social History Narrative   Not on file    Activities of Daily Living In your present state of health, do you have any difficulty performing the following activities: 04/04/2019 03/24/2019  Hearing? Y N  Comment she feels she has more trouble hearing now than in the past but hasn't had a formal hearing evaluation -  Vision? N N  Difficulty concentrating or making decisions? N Y  Walking or climbing stairs? Y Y  Comment pt doesn't go in her basement due to her family requesting she don't go down the stairs -  Dressing or bathing? N Y  Doing errands, shopping? Y Y  Comment pt states she does some of her errands but her family does most of the grocery shopping and errands especially since she had the mini-stroke -    Conservation officer, nature and eating ? Y -  Comment family does the cooking now -  Using the Toilet? N -  In the past six months, have you accidently leaked urine? Y -  Comment she wears depends especially when she gets out of the house -  Do you have problems with loss of bowel control? N -  Managing your Medications? N -  Managing your Finances? N -  Housekeeping or managing your Housekeeping? Y -  Comment family helps with all the housework -  Some recent data might be hidden    Patient Literacy How often do you need to have someone help you when you read instructions, pamphlets, or other written materials from your doctor or pharmacy?: 1 - Never What is the last grade level you completed in school?: 10th grade-got her GED later in life  Exercise Current Exercise Habits: Home exercise routine(she is also doing physical therapy now), Type of exercise: walking, Time (Minutes): 15, Frequency (Times/Week): 7, Weekly Exercise (Minutes/Week): 105, Intensity: Mild, Exercise limited by: neurologic condition(s)  Diet Patient reports consuming 3 meals a day and 2 snack(s) a day Patient reports that her primary diet is: Regular Patient reports that she does have regular access to food.   Depression Screen PHQ 2/9 Scores 04/04/2019 10/14/2018 07/05/2018 05/19/2018 02/03/2018 01/11/2018 09/16/2017  PHQ - 2 Score 5 2 0 1 0 0 0  PHQ- 9 Score 8 6 - - - - -     Fall Risk Fall Risk  04/04/2019 10/14/2018 05/19/2018 02/03/2018 01/11/2018  Falls in the past year? 0 0 No Yes Yes  Comment - - - fell outdoors -  Number falls in past yr: - - - 1 1  Injury with Fall? - - - No No  Comment - - - scratched leg -  Risk for fall due to : - - - History of fall(s) -  Follow up - - - Falls prevention discussed -     Objective:  Ashtynn Toney Watson seemed alert and oriented and she participated appropriately during our telephone visit.  Blood Pressure Weight BMI  BP Readings from Last 3 Encounters:  03/28/19 120/64  10/14/18  137/70  07/05/18 137/66   Wt Readings from Last 3 Encounters:  03/25/19 152 lb 12.5 oz (69.3 kg)  10/14/18 155 lb (70.3 kg)  07/05/18 153 lb 3.2 oz (69.5 kg)   BMI Readings from Last 1 Encounters:  03/25/19 27.94 kg/m    *Unable to obtain current vital signs, weight, and BMI due to telephone visit type  Hearing/Vision   Philip did not seem to have difficulty with hearing/understanding during the telephone conversation  Reports that she has not had a formal eye exam by an eye care professional within the past year  Reports that she has not had a formal hearing evaluation within the past year *Unable to fully assess hearing and vision during telephone visit type  Cognitive Function: 6CIT Screen 04/04/2019  What Year? 0 points  What month? 0 points  What time? 0 points  Count back from 20 0 points  Months in reverse 0 points  Repeat phrase 2 points  Total Score 2   (Normal:0-7, Significant for Dysfunction: >8)  Normal Cognitive Function Screening: Yes   Immunization & Health Maintenance Record Immunization History  Administered Date(s) Administered   Influenza Split 06/24/2012   Influenza Whole 07/06/2008   Influenza, High Dose Seasonal PF 07/02/2016, 07/07/2017, 07/05/2018   Influenza,inj,Quad PF,6+ Mos 07/06/2013, 07/05/2014, 07/05/2015   Pneumococcal Conjugate-13 08/29/2013   Pneumococcal Polysaccharide-23 03/06/2004   Td 03/06/2004, 07/08/2011   Tdap 07/06/2009, 07/08/2011   Zoster 08/07/2008    Health Maintenance  Topic Date Due   OPHTHALMOLOGY EXAM  12/12/2018   INFLUENZA VACCINE  05/07/2019   MAMMOGRAM  07/06/2019   HEMOGLOBIN A1C  09/23/2019   FOOT EXAM  10/15/2019   URINE MICROALBUMIN  10/15/2019   TETANUS/TDAP  07/07/2021   DEXA SCAN  Completed   PNA vac Low Risk Adult  Completed       Assessment  This is a routine wellness examination for [employer].  Health Maintenance: Due or Overdue Health Maintenance Due  Topic  Date Due   OPHTHALMOLOGY EXAM  12/12/2018    Charmian Toney Watson does not need a referral for Community Assistance: Care Management:   no Social Work:    no Prescription Assistance:  no Nutrition/Diabetes Education:  no   Plan:  Personalized Goals Goals Addressed            This Visit's Progress    DIET - DECREASE SODA OR JUICE INTAKE (pt-stated)       Pt states she only drinks 1 bottle of water a day, has coffee in the morning and a couple of diet sodas throughout the day      Personalized Health Maintenance & Screening Recommendations  Diabetic Eye Exam  Lung Cancer Screening Recommended: no (Low Dose CT Chest recommended if Age 40-80 years, 30 pack-year currently smoking OR have quit w/in past 15 years) Hepatitis C Screening recommended: no HIV Screening recommended: no  Advanced Directives: Written information was not prepared per patient's request.  Referrals & Orders No orders of the defined types were placed in this encounter.   Follow-up Plan  Follow-up with Laurance Flatten,  Estella Husk, MD as planned  Schedule your diabetic eye exam   I have personally reviewed and noted the following in the patients chart:    Medical and social history  Use of alcohol, tobacco or illicit drugs   Current medications and supplements  Functional ability and status  Nutritional status  Physical activity  Advanced directives  List of other physicians  Hospitalizations, surgeries, and ER visits in previous 12 months  Vitals  Screenings to include cognitive, depression, and falls  Referrals and appointments  In addition, I have reviewed and discussed with Anarosa Toney Watson certain preventive protocols, quality metrics, and best practice recommendations. A written personalized care plan for preventive services as well as general preventive health recommendations is available and can be mailed to the patient at her request.      Marylin Crosby, LPN  01/05/271

## 2019-04-05 ENCOUNTER — Other Ambulatory Visit: Payer: Self-pay | Admitting: *Deleted

## 2019-04-05 ENCOUNTER — Other Ambulatory Visit: Payer: Self-pay

## 2019-04-05 NOTE — Patient Outreach (Addendum)
[facility]) Care Management  04/05/2019  Theresa Watson Jan 23, 1933 [id number]   EMMI- stroke d/c from [place] [facility] # 3 Date: Saturday April 02 2019 1001   Red Alert Reason: Questions/problems with medicines? Yes   Insurance: united health care medicare   [facility] admissions x  1 ED visits x 1  in the last 6 months  Last admission to [facility] for acute metabolic encephalopathy, IBS HTN  On 03/23/09 to 03/28/19     Outreach attempt # 2 successful to her granddaughter Joy   Patient's grand daughter is able to verify HIPAA Given permission to speak with her grand daughter Reviewed and addressed EMMI red alert/referral to [facility] with patient Social: [name] is retired and divorced. Mrs Wolman receives support from her daughter, [name] and her granddaughter, [name]. She needs independent/assist with care needs. She needs assist with transpiration to medical appointments  EMMI: [name] denies problems with medicines. She denies concerns with taking medications as prescribed, affording medications, side effects of medications and questions about medications  Conditions: HTN, acute ischemic stroke, esophagitis, IBS, colon polyp, diverticulosis, HLD, DM type 2, hx of kidney stone, obesity cataracts bilaterally, hyponatremia DME: cbg monitor, eyeglasses dentures  Medications: denies concerns with taking medications as prescribed, affording medications, side effects of medications and questions about medications  Appointments:  04/11/19 1300 Put patient rehab  Advance Directives: Denies need for assist with advance directives    Consent: [facility] RN CM reviewed [facility] services with patient. Patient gave verbal consent for services.   Plan: [facility] RN CM will close case at this time as patient has been assessed and no needs identified/needs resolved.   Pt encouraged to return a call to [facility] RN CM prn  Routed to MD  [place]. Lavina Hamman, RN, BSN, [place] Coordinator Office number [phone] Mobile number [phone]  Main [facility] number [phone] Fax number [phone]

## 2019-04-09 ENCOUNTER — Encounter: Payer: Self-pay | Admitting: Family Medicine

## 2019-04-11 ENCOUNTER — Other Ambulatory Visit: Payer: Self-pay

## 2019-04-11 ENCOUNTER — Encounter: Payer: Self-pay | Admitting: Physical Therapy

## 2019-04-11 ENCOUNTER — Ambulatory Visit: Payer: Medicare Other | Attending: Internal Medicine | Admitting: Physical Therapy

## 2019-04-11 DIAGNOSIS — R262 Difficulty in walking, not elsewhere classified: Secondary | ICD-10-CM | POA: Insufficient documentation

## 2019-04-11 DIAGNOSIS — R2681 Unsteadiness on feet: Secondary | ICD-10-CM | POA: Diagnosis not present

## 2019-04-11 NOTE — Therapy (Signed)
[facility] [place], [place], [address] Phone: [phone]   Fax:  [phone]  Physical Therapy Treatment  Patient Details  Name: Theresa Watson MRN: [id number] Date of Birth: 1933/02/25 Referring Provider (PT): Donne Hazel, MD   Encounter Date: 04/11/2019  PT End of Session - 04/11/19 1305    Visit Number  3    Number of Visits  12    Date for PT Re-Evaluation  05/19/19    Authorization Type  Progress note every 10th visit    PT Start Time  1300    PT Stop Time  1342    PT Time Calculation (min)  42 min    Equipment Utilized During Treatment  Other (comment)   wooden SPC   Activity Tolerance  Patient tolerated treatment well    Behavior During Therapy  [facility] for tasks assessed/performed       Past Medical History:  Diagnosis Date  . Colon polyp   . Diabetes mellitus without complication ([place])   . Diverticulosis   . Esophagitis   . IBS (irritable bowel syndrome)   . Kidney stone    hx of 1 kidney stone  . Lumbosacral spondylosis without myelopathy   . Obesity, mild   . Other and unspecified hyperlipidemia   . Pain in joint, shoulder region   . 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 Stroke ([place])   . Symptomatic menopausal or female climacteric states     Past Surgical History:  Procedure Laterality Date  . CARPAL TUNNEL RELEASE    . CHOLECYSTECTOMY    . EYE SURGERY Bilateral    cataracts  . lumbar back surgery    . VAGINAL HYSTERECTOMY     partial    There were no vitals filed for this visit.  Subjective Assessment - 04/11/19 1304    Subjective  COVID 19 screening performed on patient upon arrival. Patient and daughter in law both denied any issues over the weekend.    Patient is accompained by:  Family member    Pertinent History  CVA 03/24/2019, HTN, DM    Limitations  Walking;House hold activities    Diagnostic tests  MRI: right thalmus CVA    Patient Stated Goals   become independent again    Currently in Pain?  No/denies         [facility] PT Assessment - 04/11/19 0001      Assessment   Medical Diagnosis  Unsteadiness on feet, acute ichemic stroke    Referring Provider (PT)  Donne Hazel, MD    Onset Date/Surgical Date  03/24/19    Hand Dominance  Right    Next MD Visit  n/a    Prior Therapy  per daughter in law, in the hospital      Precautions   Precautions  Fall                   [facility] Adult PT Treatment/Exercise - 04/11/19 0001      Knee/Hip Exercises: Aerobic   Nustep  L4 x10 min      Knee/Hip Exercises: Seated   Long Arc Quad  Strengthening;Both;2 sets;10 reps    Long Arc Quad Weight  3 lbs.    Ball Squeeze  x2 minutes    Clamshell with TheraBand  Red   x20 reps   Marching  AROM;Both;20 reps    Hamstring Curl  Strengthening;Both;20 reps;Limitations    Hamstring Limitations  red  theraband    Sit to Sand  10 reps;without UE support          Balance Exercises - 04/11/19 1335      Balance Exercises: Standing   Standing Eyes Opened  Wide (BOA);Foam/compliant surface;Cognitive challenge   x2 min   Tandem Stance  Eyes open;Upper extremity support 2;Cognitive challenge   x2 min   Step Ups  Forward;6 inch;UE support 2   x20 reps each LE   Sidestepping  Upper extremity support;5 reps    Heel Raises Limitations  x20 reps    Toe Raise Limitations  x20 reps             PT Long Term Goals - 04/11/19 1306      PT LONG TERM GOAL #1   Title  Patient will be independent HEP    Time  6    Period  Weeks    Status  Partially Met   "half the time"     PT LONG TERM GOAL #2   Title  Patient will improve LE functional strength as noted by the ability to perform modified 5x sit to stand test to 16 seconds.    Time  6    Period  Weeks    Status  On-going      PT LONG TERM GOAL #3   Title  Patient will demonstrate LE strength to 4+/5 or greater to improve stability during functional tasks    Time  6    Period   Weeks    Status  On-going            Plan - 04/11/19 1307    Clinical Impression Statement  Patient presented in clinic with no complaints of any discomfort or concerns regarding balance or safety. Patient presented in clinic with a wooden SPC. Patient able to complete seated and balance activities with brief rest breaks.Cognitive challenges utilized throughout balance activities to challenge patient. Appropriate ankle strategies noted during balance and close supervision provided by PTA for patient safety.    Personal Factors and Comorbidities  Age;Comorbidity 3+    Comorbidities  CVA 03/24/2019, HTN, DM    Examination-Activity Limitations  Bathing;Dressing;Hygiene/Grooming;Stairs    Stability/Clinical Decision Making  Stable/Uncomplicated    Rehab Potential  Good    PT Frequency  2x / week    PT Duration  6 weeks    PT Treatment/Interventions  Neuromuscular re-education;Gait training;Stair training;Functional mobility training;Therapeutic exercise;Balance training;Therapeutic activities;Patient/family education;Passive range of motion;Manual techniques;ADLs/Self Care Home Management    PT Next Visit Plan  nustep, strengthening of LE and UEs, core stability.    PT Home Exercise Plan  see patient education section    Consulted and Agree with Plan of Care  Patient;Family member/caregiver    Family Member Consulted  daughter-in-law       Patient will benefit from skilled therapeutic intervention in order to improve the following deficits and impairments:  Abnormal gait, Decreased safety awareness, Decreased balance, Decreased activity tolerance, Decreased strength  Visit Diagnosis: 1. Unsteadiness on feet   2. Difficulty in walking, not elsewhere classified        Problem List Patient Active Problem List   Diagnosis Date Noted  . Acute ischemic stroke ([place]) 03/25/2019  . Acute metabolic encephalopathy 64/15/8309  . Hyponatremia 03/24/2019  . Diabetes mellitus due to underlying  condition with diabetic autonomic neuropathy, without long-term current use of insulin ([place]) 09/18/2015  . Annual physical exam 02/26/2015  . Type 2 diabetes mellitus with neurological manifestations,  controlled ([place]) 08/29/2013  . Hypertension 11/13/2012  . Cataracts, bilateral 11/13/2012  . Symptomatic menopausal or female climacteric states   . Hyperlipidemia associated with type 2 diabetes mellitus ([place])   . Esophagitis   . 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 Obesity, mild   . IBS (irritable bowel syndrome)   . Kidney stone   . Lumbosacral spondylosis without myelopathy   . Pain in joint, shoulder region   . Colon polyp   . Diverticulosis     Standley Brooking, PTA 04/11/2019, 3:07 PM  [facility] [address] [place], [place], [address] Phone: [phone]   Fax:  [phone]  Name: Theresa Watson MRN: [id number] Date of Birth: 1933-06-28

## 2019-04-15 ENCOUNTER — Other Ambulatory Visit: Payer: Self-pay

## 2019-04-15 ENCOUNTER — Ambulatory Visit: Payer: Medicare Other | Admitting: *Deleted

## 2019-04-15 DIAGNOSIS — R262 Difficulty in walking, not elsewhere classified: Secondary | ICD-10-CM

## 2019-04-15 DIAGNOSIS — R2681 Unsteadiness on feet: Secondary | ICD-10-CM

## 2019-04-15 NOTE — Therapy (Signed)
[facility] [place], [place], [address] Phone: [phone]   Fax:  [phone]  Physical Therapy Treatment  Patient Details  Name: Theresa Watson MRN: [id number] Date of Birth: 14-Oct-1932 Referring Provider (PT): Donne Hazel, MD   Encounter Date: 04/15/2019  PT End of Session - 04/15/19 0952    Visit Number  4    Number of Visits  12    Date for PT Re-Evaluation  05/19/19    Authorization Type  Progress note every 10th visit    PT Start Time  0945    PT Stop Time  1030    PT Time Calculation (min)  45 min       Past Medical History:  Diagnosis Date  . Colon polyp   . Diabetes mellitus without complication ([place])   . Diverticulosis   . Esophagitis   . IBS (irritable bowel syndrome)   . Kidney stone    hx of 1 kidney stone  . Lumbosacral spondylosis without myelopathy   . Obesity, mild   . Other and unspecified hyperlipidemia   . Pain in joint, shoulder region   . 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 Stroke ([place])   . Symptomatic menopausal or female climacteric states     Past Surgical History:  Procedure Laterality Date  . CARPAL TUNNEL RELEASE    . CHOLECYSTECTOMY    . EYE SURGERY Bilateral    cataracts  . lumbar back surgery    . VAGINAL HYSTERECTOMY     partial    There were no vitals filed for this visit.  Subjective Assessment - 04/15/19 0950    Subjective  COVID 19 screening performed on patient upon arrival. Did ok after last visit    Pertinent History  CVA 03/24/2019, HTN, DM    Limitations  Walking;House hold activities    Diagnostic tests  MRI: right thalmus CVA    Patient Stated Goals  become independent again    Currently in Pain?  No/denies    Pain Location  Hip    Pain Orientation  Right;Left    Pain Descriptors / Indicators  Aching                       [facility] Adult PT Treatment/Exercise - 04/15/19 0001      Exercises   Exercises   Knee/Hip;Shoulder      Knee/Hip Exercises: Aerobic   Nustep  L4 x12 min  seat5      Knee/Hip Exercises: Standing   Other Standing Knee Exercises  side stepping in //bars with focus on looking forward and not down      Knee/Hip Exercises: Seated   Long Arc Quad  Strengthening;Both;2 sets;10 reps    Long Arc Quad Weight  3 lbs.    Ball Squeeze  x2 minutes    Hamstring Curl  Strengthening;Both;Limitations;10 reps    Hamstring Limitations  red theraband    Sit to Sand  10 reps;without UE support;2 sets          Balance Exercises - 04/15/19 1009      Balance Exercises: Standing   Standing Eyes Opened  Wide (BOA);Foam/compliant surface;Cognitive challenge   x2 min   Tandem Stance  Eyes open;Upper extremity support 2;Cognitive challenge   x2 min   Step Ups  Forward;6 inch;Intermittent UE support   x20 reps each LE toe touches   Sidestepping  Upper extremity support;5 reps  Heel Raises Limitations  x20 reps    Toe Raise Limitations  x20 reps             PT Long Term Goals - 04/11/19 1306      PT LONG TERM GOAL #1   Title  Patient will be independent HEP    Time  6    Period  Weeks    Status  Partially Met   "half the time"     PT LONG TERM GOAL #2   Title  Patient will improve LE functional strength as noted by the ability to perform modified 5x sit to stand test to 16 seconds.    Time  6    Period  Weeks    Status  On-going      PT LONG TERM GOAL #3   Title  Patient will demonstrate LE strength to 4+/5 or greater to improve stability during functional tasks    Time  6    Period  Weeks    Status  On-going            Plan - 04/15/19 1042    Clinical Impression Statement  Pt arrived today doing fairly well and reports doing ok after last visit. Her daughter accompanied her today and feels she is doing good. Pt was able to perform strengthening exs and balance act.'s without complaints. Pt needs VC's to slow down with many of the exs and to look up during  balance act's. SBA and CGA needed during act.'s.    Personal Factors and Comorbidities  Age;Comorbidity 3+    Comorbidities  CVA 03/24/2019, HTN, DM    Examination-Activity Limitations  Bathing;Dressing;Hygiene/Grooming;Stairs    Stability/Clinical Decision Making  Stable/Uncomplicated    Rehab Potential  Good    PT Frequency  2x / week    PT Duration  6 weeks    PT Treatment/Interventions  Neuromuscular re-education;Gait training;Stair training;Functional mobility training;Therapeutic exercise;Balance training;Therapeutic activities;Patient/family education;Passive range of motion;Manual techniques;ADLs/Self Care Home Management    PT Next Visit Plan  nustep, strengthening of LE and UEs, core stability.    PT Home Exercise Plan  see patient education section    Consulted and Agree with Plan of Care  Patient       Patient will benefit from skilled therapeutic intervention in order to improve the following deficits and impairments:  Abnormal gait, Decreased safety awareness, Decreased balance, Decreased activity tolerance, Decreased strength  Visit Diagnosis: 1. Difficulty in walking, not elsewhere classified   2. Unsteadiness on feet        Problem List Patient Active Problem List   Diagnosis Date Noted  . Acute ischemic stroke ([place]) 03/25/2019  . Acute metabolic encephalopathy 67/61/9509  . Hyponatremia 03/24/2019  . Diabetes mellitus due to underlying condition with diabetic autonomic neuropathy, without long-term current use of insulin ([place]) 09/18/2015  . Annual physical exam 02/26/2015  . Type 2 diabetes mellitus with neurological manifestations, controlled ([place]) 08/29/2013  . Hypertension 11/13/2012  . Cataracts, bilateral 11/13/2012  . Symptomatic menopausal or female climacteric states   . Hyperlipidemia associated with type 2 diabetes mellitus ([place])   . Esophagitis   . 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 Obesity, mild   . IBS (irritable  bowel syndrome)   . Kidney stone   . Lumbosacral spondylosis without myelopathy   . Pain in joint, shoulder region   . Colon polyp   . Diverticulosis     Trevan Messman,CHRIS, PTA 04/15/2019, 10:51 AM  [facility] [place], [place], [address] Phone: [phone]   Fax:  [phone]  Name: MIRRIAM VADALA MRN: [id number] Date of Birth: 1933/01/02

## 2019-04-18 ENCOUNTER — Other Ambulatory Visit: Payer: Self-pay

## 2019-04-18 ENCOUNTER — Encounter: Payer: Self-pay | Admitting: Physical Therapy

## 2019-04-18 ENCOUNTER — Ambulatory Visit: Payer: Medicare Other | Admitting: Physical Therapy

## 2019-04-18 DIAGNOSIS — R262 Difficulty in walking, not elsewhere classified: Secondary | ICD-10-CM

## 2019-04-18 DIAGNOSIS — R2681 Unsteadiness on feet: Secondary | ICD-10-CM | POA: Diagnosis not present

## 2019-04-18 NOTE — Therapy (Signed)
[facility] [place], [place], [address] Phone: [phone]   Fax:  [phone]  Physical Therapy Treatment  Patient Details  Name: Theresa Watson MRN: [id number] Date of Birth: [birth year] Referring Provider (PT): Donne Hazel, MD   Encounter Date: 04/18/2019  PT End of Session - 04/18/19 1034    Visit Number  5    Number of Visits  12    Date for PT Re-Evaluation  05/19/19    Authorization Type  Progress note every 10th visit    PT Start Time  1030    PT Stop Time  1113    PT Time Calculation (min)  43 min    Equipment Utilized During Treatment  Other (comment)   SPC   Activity Tolerance  Patient tolerated treatment well    Behavior During Therapy  [facility] for tasks assessed/performed       Past Medical History:  Diagnosis Date  . Colon polyp   . Diabetes mellitus without complication ([place])   . Diverticulosis   . Esophagitis   . IBS (irritable bowel syndrome)   . Kidney stone    hx of 1 kidney stone  . Lumbosacral spondylosis without myelopathy   . Obesity, mild   . Other and unspecified hyperlipidemia   . Pain in joint, shoulder region   . 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 Stroke ([place])   . Symptomatic menopausal or female climacteric states     Past Surgical History:  Procedure Laterality Date  . CARPAL TUNNEL RELEASE    . CHOLECYSTECTOMY    . EYE SURGERY Bilateral    cataracts  . lumbar back surgery    . VAGINAL HYSTERECTOMY     partial    There were no vitals filed for this visit.  Subjective Assessment - 04/18/19 1033    Subjective  COVID 19 screening performed on patient upon arrival. Denies any falls or trips over the weekend.    Patient is accompained by:  Family member    Pertinent History  CVA 03/24/2019, HTN, DM    Diagnostic tests  MRI: right thalmus CVA    Patient Stated Goals  become independent again    Currently in Pain?  No/denies         [facility]  PT Assessment - 04/18/19 0001      Assessment   Medical Diagnosis  Unsteadiness on feet, acute ichemic stroke    Referring Provider (PT)  Donne Hazel, MD    Onset Date/Surgical Date  03/24/19    Hand Dominance  Right    Next MD Visit  n/a    Prior Therapy  per daughter in law, in the hospital      Precautions   Precautions  Fall                   [facility] Adult PT Treatment/Exercise - 04/18/19 0001      Knee/Hip Exercises: Aerobic   Nustep  L4 x12 min  seat5      Knee/Hip Exercises: Seated   Long Arc Quad  Strengthening;Both;2 sets;10 reps    Long Arc Quad Weight  3 lbs.    Ball Squeeze  x2 minutes    Clamshell with TheraBand  Red   x20 reps   Marching  AROM;Both;15 reps    Hamstring Curl  Strengthening;Both;Limitations;10 reps    Hamstring Limitations  red theraband          Balance  Exercises - 04/18/19 1210      Balance Exercises: Standing   Standing Eyes Opened  Narrow base of support (BOS);Foam/compliant surface;Time   x2 min   Standing Eyes Closed  Narrow base of support (BOS);Foam/compliant surface;Time   x2 min; 1 UE support   Tandem Stance  Eyes open;Intermittent upper extremity support;Time   x2 min   Step Ups  Forward;6 inch;UE support 2   x10 reps each   Sidestepping  Upper extremity support;5 reps    Heel Raises Limitations  x20 reps    Toe Raise Limitations  x20 reps    Other Standing Exercises  foot taps to 6" step BLE x10 reps each             PT Long Term Goals - 04/11/19 1306      PT LONG TERM GOAL #1   Title  Patient will be independent HEP    Time  6    Period  Weeks    Status  Partially Met   "half the time"     PT LONG TERM GOAL #2   Title  Patient will improve LE functional strength as noted by the ability to perform modified 5x sit to stand test to 16 seconds.    Time  6    Period  Weeks    Status  On-going      PT LONG TERM GOAL #3   Title  Patient will demonstrate LE strength to 4+/5 or greater to improve  stability during functional tasks    Time  6    Period  Weeks    Status  On-going            Plan - 04/18/19 1209    Clinical Impression Statement  Patient presented in clinic with no complaints of falls or stumbling at home. Patient able to complete therex and balance activities with VCs to refocus on proper technique. Greatest balance challenge observed in clinic was eyes closed. Ankle strategies noted on uneven surfaces.    Personal Factors and Comorbidities  Age;Comorbidity 3+    Comorbidities  CVA 03/24/2019, HTN, DM    Examination-Activity Limitations  Bathing;Dressing;Hygiene/Grooming;Stairs    Stability/Clinical Decision Making  Stable/Uncomplicated    Rehab Potential  Good    PT Frequency  2x / week    PT Duration  6 weeks    PT Treatment/Interventions  Neuromuscular re-education;Gait training;Stair training;Functional mobility training;Therapeutic exercise;Balance training;Therapeutic activities;Patient/family education;Passive range of motion;Manual techniques;ADLs/Self Care Home Management    PT Next Visit Plan  nustep, strengthening of LE and UEs, core stability.    PT Home Exercise Plan  see patient education section    Consulted and Agree with Plan of Care  Patient       Patient will benefit from skilled therapeutic intervention in order to improve the following deficits and impairments:  Abnormal gait, Decreased safety awareness, Decreased balance, Decreased activity tolerance, Decreased strength  Visit Diagnosis: 1. Difficulty in walking, not elsewhere classified   2. Unsteadiness on feet        Problem List Patient Active Problem List   Diagnosis Date Noted  . Acute ischemic stroke ([place]) 03/25/2019  . Acute metabolic encephalopathy 53/61/4431  . Hyponatremia 03/24/2019  . Diabetes mellitus due to underlying condition with diabetic autonomic neuropathy, without long-term current use of insulin ([place]) 09/18/2015  . Annual physical exam 02/26/2015  . Type 2  diabetes mellitus with neurological manifestations, controlled ([place]) 08/29/2013  . Hypertension 11/13/2012  . Cataracts, bilateral 11/13/2012  .  Symptomatic menopausal or female climacteric states   . Hyperlipidemia associated with type 2 diabetes mellitus ([place])   . Esophagitis   . 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 Obesity, mild   . IBS (irritable bowel syndrome)   . Kidney stone   . Lumbosacral spondylosis without myelopathy   . Pain in joint, shoulder region   . Colon polyp   . Diverticulosis     Standley Brooking, PTA 04/18/2019, 12:17 PM  [facility] [address] [place], [place], [address] Phone: [phone]   Fax:  [phone]  Name: Theresa Watson MRN: [id number] Date of Birth: 02/28/1933

## 2019-04-22 ENCOUNTER — Ambulatory Visit: Payer: Medicare Other | Admitting: *Deleted

## 2019-04-22 ENCOUNTER — Other Ambulatory Visit: Payer: Self-pay

## 2019-04-22 DIAGNOSIS — R2681 Unsteadiness on feet: Secondary | ICD-10-CM

## 2019-04-22 DIAGNOSIS — R262 Difficulty in walking, not elsewhere classified: Secondary | ICD-10-CM

## 2019-04-22 NOTE — Therapy (Signed)
[facility] [place], [place], [address] Phone: [phone]   Fax:  [phone]  Physical Therapy Treatment  Patient Details  Name: AEISHA MINARIK MRN: [id number] Date of Birth: April 13, 1933 Referring Provider (PT): Donne Hazel, MD   Encounter Date: 04/22/2019  PT End of Session - 04/22/19 1224    Visit Number  6    Number of Visits  12    Date for PT Re-Evaluation  05/19/19    PT Start Time  1030    PT Stop Time  1116    PT Time Calculation (min)  46 min       Past Medical History:  Diagnosis Date  . Colon polyp   . Diabetes mellitus without complication ([place])   . Diverticulosis   . Esophagitis   . IBS (irritable bowel syndrome)   . Kidney stone    hx of 1 kidney stone  . Lumbosacral spondylosis without myelopathy   . Obesity, mild   . Other and unspecified hyperlipidemia   . Pain in joint, shoulder region   . 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 Stroke ([place])   . Symptomatic menopausal or female climacteric states     Past Surgical History:  Procedure Laterality Date  . CARPAL TUNNEL RELEASE    . CHOLECYSTECTOMY    . EYE SURGERY Bilateral    cataracts  . lumbar back surgery    . VAGINAL HYSTERECTOMY     partial    There were no vitals filed for this visit.  Subjective Assessment - 04/22/19 1044    Subjective  COVID 19 screening performed on patient upon arrival. Doing better since starting PT    Pertinent History  CVA 03/24/2019, HTN, DM    Diagnostic tests  MRI: right thalmus CVA    Patient Stated Goals  become independent again                       [facility] Adult PT Treatment/Exercise - 04/22/19 0001      Exercises   Exercises  Knee/Hip;Shoulder      Knee/Hip Exercises: Aerobic   Nustep  L4 x12 min  seat5      Knee/Hip Exercises: Seated   Long Arc Quad  Strengthening;Both;10 reps;3 sets    Long Arc Quad Weight  4 lbs.    Ball Squeeze  x2  minutes    Clamshell with TheraBand  Red   x20 reps   Marching  AROM;Both;15 reps    Hamstring Curl  Strengthening;Both;Limitations;10 reps    Hamstring Limitations  red theraband          Balance Exercises - 04/22/19 1046      Balance Exercises: Standing   Standing Eyes Opened  Narrow base of support (BOS);Foam/compliant surface;Time   x2 min   Standing Eyes Closed  Narrow base of support (BOS);Foam/compliant surface;Time   x2 min; 1 UE support   Tandem Stance  Eyes open;Intermittent upper extremity support;Time   x2 min   Step Ups  Forward;6 inch;UE support 2   10 reps each   Sidestepping  5 reps    Heel Raises Limitations  x20 reps    Toe Raise Limitations  x20 reps    Other Standing Exercises  foot taps to 6" step BLE 3 x10 reps each             PT Long Term Goals - 04/11/19 1306  PT LONG TERM GOAL #1   Title  Patient will be independent HEP    Time  6    Period  Weeks    Status  Partially Met   "half the time"     PT LONG TERM GOAL #2   Title  Patient will improve LE functional strength as noted by the ability to perform modified 5x sit to stand test to 16 seconds.    Time  6    Period  Weeks    Status  On-going      PT LONG TERM GOAL #3   Title  Patient will demonstrate LE strength to 4+/5 or greater to improve stability during functional tasks    Time  6    Period  Weeks    Status  On-going            Plan - 04/22/19 1226    Clinical Impression Statement  Pt arrived today in good spirits and reports having better energy  today. She was able to perform all strengthening and balance act's today with minimal rest B/W  sets. Improved  balance today with eyes closed act.'s and able to self correct. She was able to progress to 4#s today with LAQs and did well.    Personal Factors and Comorbidities  Age;Comorbidity 3+    Comorbidities  CVA 03/24/2019, HTN, DM    Examination-Activity Limitations  Bathing;Dressing;Hygiene/Grooming;Stairs     Stability/Clinical Decision Making  Stable/Uncomplicated    PT Frequency  2x / week    PT Duration  6 weeks    PT Treatment/Interventions  Neuromuscular re-education;Gait training;Stair training;Functional mobility training;Therapeutic exercise;Balance training;Therapeutic activities;Patient/family education;Passive range of motion;Manual techniques;ADLs/Self Care Home Management    PT Next Visit Plan  nustep, strengthening of LE and UEs, core stability.    PT Home Exercise Plan  see patient education section    Consulted and Agree with Plan of Care  Patient       Patient will benefit from skilled therapeutic intervention in order to improve the following deficits and impairments:  Abnormal gait, Decreased safety awareness, Decreased balance, Decreased activity tolerance, Decreased strength  Visit Diagnosis: 1. Difficulty in walking, not elsewhere classified   2. Unsteadiness on feet        Problem List Patient Active Problem List   Diagnosis Date Noted  . Acute ischemic stroke ([place]) 03/25/2019  . Acute metabolic encephalopathy 94/70/9628  . Hyponatremia 03/24/2019  . Diabetes mellitus due to underlying condition with diabetic autonomic neuropathy, without long-term current use of insulin ([place]) 09/18/2015  . Annual physical exam 02/26/2015  . Type 2 diabetes mellitus with neurological manifestations, controlled ([place]) 08/29/2013  . Hypertension 11/13/2012  . Cataracts, bilateral 11/13/2012  . Symptomatic menopausal or female climacteric states   . Hyperlipidemia associated with type 2 diabetes mellitus ([place])   . Esophagitis   . 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 Obesity, mild   . IBS (irritable bowel syndrome)   . Kidney stone   . Lumbosacral spondylosis without myelopathy   . Pain in joint, shoulder region   . Colon polyp   . Diverticulosis     ,CHRIS, PTA  04/22/2019, 12:32 PM  [facility] [place], [place], [address] Phone: [phone]   Fax:  [phone]  Name: SAYA MCCOLL MRN: [id number] Date of Birth: Feb 18, 1933

## 2019-04-25 ENCOUNTER — Encounter: Payer: Self-pay | Admitting: Physical Therapy

## 2019-04-25 ENCOUNTER — Other Ambulatory Visit: Payer: Self-pay

## 2019-04-25 ENCOUNTER — Ambulatory Visit: Payer: Medicare Other | Admitting: Physical Therapy

## 2019-04-25 DIAGNOSIS — R2681 Unsteadiness on feet: Secondary | ICD-10-CM | POA: Diagnosis not present

## 2019-04-25 DIAGNOSIS — R262 Difficulty in walking, not elsewhere classified: Secondary | ICD-10-CM

## 2019-04-25 NOTE — Therapy (Signed)
[facility] [place], [place], [address] Phone: [phone]   Fax:  [phone]  Physical Therapy Treatment  Patient Details  Name: Theresa Watson MRN: [id number] Date of Birth: April 24, 1933 Referring Provider (PT): Donne Hazel, MD   Encounter Date: 04/25/2019  PT End of Session - 04/25/19 1517    Visit Number  7    Number of Visits  12    Date for PT Re-Evaluation  05/19/19    Authorization Type  Progress note every 10th visit    PT Start Time  1432    PT Stop Time  1518    PT Time Calculation (min)  46 min    Equipment Utilized During Treatment  Other (comment);Gait belt   SPC   Activity Tolerance  Patient tolerated treatment well    Behavior During Therapy  [facility] for tasks assessed/performed       Past Medical History:  Diagnosis Date  . Colon polyp   . Diabetes mellitus without complication ([place])   . Diverticulosis   . Esophagitis   . IBS (irritable bowel syndrome)   . Kidney stone    hx of 1 kidney stone  . Lumbosacral spondylosis without myelopathy   . Obesity, mild   . Other and unspecified hyperlipidemia   . Pain in joint, shoulder region   . 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 Stroke ([place])   . Symptomatic menopausal or female climacteric states     Past Surgical History:  Procedure Laterality Date  . CARPAL TUNNEL RELEASE    . CHOLECYSTECTOMY    . EYE SURGERY Bilateral    cataracts  . lumbar back surgery    . VAGINAL HYSTERECTOMY     partial    There were no vitals filed for this visit.  Subjective Assessment - 04/25/19 1435    Subjective  COVID 19 screening performed on patient upon arrival. Reports feeling good but wants to know when she can stay at home by herself again.    Patient is accompained by:  Family member    Pertinent History  CVA 03/24/2019, HTN, DM    Limitations  Walking;House hold activities    Diagnostic tests  MRI: right thalmus CVA    Patient Stated Goals  become independent again    Currently in Pain?  No/denies         [facility] PT Assessment - 04/25/19 0001      Assessment   Medical Diagnosis  Unsteadiness on feet, acute ichemic stroke    Referring Provider (PT)  Donne Hazel, MD    Onset Date/Surgical Date  03/24/19    Hand Dominance  Right    Next MD Visit  n/a    Prior Therapy  per daughter in law, in the hospital      Precautions   Precautions  Fall                   [facility] Adult PT Treatment/Exercise - 04/25/19 0001      Ambulation/Gait   Assistive device  Straight cane    Gait Pattern  Step-through pattern;Decreased stride length    Ambulation Surface  Outdoor;Grass      Therapeutic Activites    Therapeutic Activities  Other Therapeutic Activities;ADL's    ADL's  walking with mug from counter top to UBE for dual tasks    Other Therapeutic Activities  putty jar from counter top to bottom shelf to simulate putting  food in microwave      Exercises   Exercises  Knee/Hip;Shoulder      Knee/Hip Exercises: Aerobic   Nustep  L4 x12 min  seat5      Knee/Hip Exercises: Standing   Heel Raises  Both;2 sets;10 reps    Forward Step Up  Both;2 sets;10 reps;Hand Hold: 2;Step Height: 6"    Functional Squat  2 sets;10 reps    Other Standing Knee Exercises  lateral stepping at countertop x4          Balance Exercises - 04/25/19 1537      Balance Exercises: Standing   Standing Eyes Opened  Narrow base of support (BOS);Foam/compliant surface;Time   2 minutes   Marching Limitations  Marching in // bars             PT Long Term Goals - 04/11/19 1306      PT LONG TERM GOAL #1   Title  Patient will be independent HEP    Time  6    Period  Weeks    Status  Partially Met   "half the time"     PT LONG TERM GOAL #2   Title  Patient will improve LE functional strength as noted by the ability to perform modified 5x sit to stand test to 16 seconds.    Time  6    Period  Weeks     Status  On-going      PT LONG TERM GOAL #3   Title  Patient will demonstrate LE strength to 4+/5 or greater to improve stability during functional tasks    Time  6    Period  Weeks    Status  On-going            Plan - 04/25/19 1520    Clinical Impression Statement  Patient arrived to physical therapy with good spirits. Patient expressed how she would like to return to living independently. Patient, patient's daughter-in-law and PT disccussed the importance of functional, dual task activites like carrying mugs to the countertop to table and walking while holding an object. Patient responded well with supervision. Patient required contact guard to close supervision for outdoor uneven walking as she would walk on grass to exit her home. Patient reports not using her front steps due to a loose railing. Patient and PT discussed having her perform home activities independently and possibly leaving her home for a few hours to see how well she responds. All in agreement.    Personal Factors and Comorbidities  Age;Comorbidity 3+    Comorbidities  CVA 03/24/2019, HTN, DM    Examination-Activity Limitations  Bathing;Dressing;Hygiene/Grooming;Stairs    Stability/Clinical Decision Making  Stable/Uncomplicated    Clinical Decision Making  Moderate    Rehab Potential  Good    PT Frequency  2x / week    PT Duration  6 weeks    PT Treatment/Interventions  Neuromuscular re-education;Gait training;Stair training;Functional mobility training;Therapeutic exercise;Balance training;Therapeutic activities;Patient/family education;Passive range of motion;Manual techniques;ADLs/Self Care Home Management    PT Next Visit Plan  nustep, strengthening of LE and UEs, core stability functional exercises.    PT Home Exercise Plan  see patient education section    Consulted and Agree with Plan of Care  Patient    Family Member Consulted  daughter-in-law       Patient will benefit from skilled therapeutic intervention  in order to improve the following deficits and impairments:  Abnormal gait, Decreased safety awareness, Decreased balance, Decreased activity tolerance,  Decreased strength  Visit Diagnosis: 1. Unsteadiness on feet   2. Difficulty in walking, not elsewhere classified        Problem List Patient Active Problem List   Diagnosis Date Noted  . Acute ischemic stroke ([place]) 03/25/2019  . Acute metabolic encephalopathy 68/16/6196  . Hyponatremia 03/24/2019  . Diabetes mellitus due to underlying condition with diabetic autonomic neuropathy, without long-term current use of insulin ([place]) 09/18/2015  . Annual physical exam 02/26/2015  . Type 2 diabetes mellitus with neurological manifestations, controlled ([place]) 08/29/2013  . Hypertension 11/13/2012  . Cataracts, bilateral 11/13/2012  . Symptomatic menopausal or female climacteric states   . Hyperlipidemia associated with type 2 diabetes mellitus ([place])   . Esophagitis   . 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 Obesity, mild   . IBS (irritable bowel syndrome)   . Kidney stone   . Lumbosacral spondylosis without myelopathy   . Pain in joint, shoulder region   . Colon polyp   . Diverticulosis    Gabriela Eves, PT, DPT 04/25/2019, 3:46 PM  [facility] [place], [place], [address] Phone: [phone]   Fax:  [phone]  Name: Theresa Watson MRN: [id number] Date of Birth: 1932/10/23

## 2019-04-28 ENCOUNTER — Ambulatory Visit: Payer: Medicare Other | Admitting: Physical Therapy

## 2019-04-28 ENCOUNTER — Other Ambulatory Visit: Payer: Self-pay

## 2019-04-28 DIAGNOSIS — R262 Difficulty in walking, not elsewhere classified: Secondary | ICD-10-CM | POA: Diagnosis not present

## 2019-04-28 DIAGNOSIS — R2681 Unsteadiness on feet: Secondary | ICD-10-CM | POA: Diagnosis not present

## 2019-04-28 NOTE — Therapy (Signed)
[facility] [place], [place], [address] Phone: [phone]   Fax:  [phone]  Physical Therapy Treatment  Patient Details  Name: Theresa Watson MRN: [id number] Date of Birth: 01-08-1933 Referring Provider (PT): Donne Hazel, MD   Encounter Date: 04/28/2019  PT End of Session - 04/28/19 1420    Visit Number  8    Number of Visits  12    Date for PT Re-Evaluation  05/19/19    Authorization Type  Progress note every 10th visit    PT Start Time  1345    PT Stop Time  1430    PT Time Calculation (min)  45 min    Equipment Utilized During Treatment  Other (comment);Gait belt   SPC   Activity Tolerance  Patient tolerated treatment well    Behavior During Therapy  [facility] for tasks assessed/performed       Past Medical History:  Diagnosis Date  . Colon polyp   . Diabetes mellitus without complication ([place])   . Diverticulosis   . Esophagitis   . IBS (irritable bowel syndrome)   . Kidney stone    hx of 1 kidney stone  . Lumbosacral spondylosis without myelopathy   . Obesity, mild   . Other and unspecified hyperlipidemia   . Pain in joint, shoulder region   . 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 Stroke ([place])   . Symptomatic menopausal or female climacteric states     Past Surgical History:  Procedure Laterality Date  . CARPAL TUNNEL RELEASE    . CHOLECYSTECTOMY    . EYE SURGERY Bilateral    cataracts  . lumbar back surgery    . VAGINAL HYSTERECTOMY     partial    There were no vitals filed for this visit.  Subjective Assessment - 04/28/19 1801    Subjective  COVID 19 screening performed on patient upon arrival. Reported she stayed at her daughter in [place] by herself for 3 hours and felt good. Daughter in law reports she will take patient home and she will be home alone for about 5 hours before patient's daughter will come from work.    Patient is accompained by:  Family member    Pertinent History  CVA 03/24/2019, HTN, DM    Limitations  Walking;House hold activities    Diagnostic tests  MRI: right thalmus CVA    Patient Stated Goals  become independent again    Currently in Pain?  No/denies         [facility] PT Assessment - 04/28/19 0001      Assessment   Medical Diagnosis  Unsteadiness on feet, acute ichemic stroke    Referring Provider (PT)  Donne Hazel, MD    Onset Date/Surgical Date  03/24/19    Hand Dominance  Right    Next MD Visit  n/a    Prior Therapy  per daughter in law, in the hospital      Precautions   Precautions  Fall                   [facility] Adult PT Treatment/Exercise - 04/28/19 0001      Ambulation/Gait   Assistive device  Straight cane    Gait Pattern  Step-through pattern;Decreased stride length    Ambulation Surface  Outdoor;Grass      Therapeutic Activites    ADL's  walking with velcro board from counter top to UBE for dual tasks  Other Therapeutic Activities  putty jar from counter top to bottom shelf to simulate putting food in microwave      Knee/Hip Exercises: Aerobic   Nustep  L4 x15 min  seat5      Knee/Hip Exercises: Standing   Hip Abduction  AROM;Both;2 sets;10 reps    Forward Step Up  Both;2 sets;10 reps;Hand Hold: 2;Step Height: 6"    Functional Squat  2 sets;10 reps    Other Standing Knee Exercises  lateral stepping at countertop x3 minutes      Knee/Hip Exercises: Seated   Ball Squeeze  x2 minutes    Hamstring Curl  Strengthening;Both;Limitations;10 reps    Hamstring Limitations  red theraband          Balance Exercises - 04/28/19 1812      Balance Exercises: Standing   Standing Eyes Opened  Narrow base of support (BOS);Time;Solid surface;Head turns   2 minutes   Standing Eyes Closed  Narrow base of support (BOS);Foam/compliant surface;Time   2 minutes   Marching Limitations  Marching in // bars x20             PT Long Term Goals - 04/11/19 1306      PT LONG TERM GOAL #1    Title  Patient will be independent HEP    Time  6    Period  Weeks    Status  Partially Met   "half the time"     PT LONG TERM GOAL #2   Title  Patient will improve LE functional strength as noted by the ability to perform modified 5x sit to stand test to 16 seconds.    Time  6    Period  Weeks    Status  On-going      PT LONG TERM GOAL #3   Title  Patient will demonstrate LE strength to 4+/5 or greater to improve stability during functional tasks    Time  6    Period  Weeks    Status  On-going            Plan - 04/28/19 1451    Clinical Impression Statement  Patient arrived feeling good with no new complaints. Patient was able to tolerate treatment well with minimal reports of fatigue. Patient was able to perform dual task activities with no loss of balance and at a good pace. Patient was able to ambulate on grass with cane and close supervision. Patient did require intermittent verbal cuing to advance her cane. Patient, patient's daughter in law and patient discussed completing two more visits then discharge. All in agreement.    Personal Factors and Comorbidities  Age;Comorbidity 3+    Comorbidities  CVA 03/24/2019, HTN, DM    Examination-Activity Limitations  Bathing;Dressing;Hygiene/Grooming;Stairs    Stability/Clinical Decision Making  Stable/Uncomplicated    Clinical Decision Making  Moderate    Rehab Potential  Good    PT Frequency  2x / week    PT Duration  6 weeks    PT Treatment/Interventions  Neuromuscular re-education;Gait training;Stair training;Functional mobility training;Therapeutic exercise;Balance training;Therapeutic activities;Patient/family education;Passive range of motion;Manual techniques;ADLs/Self Care Home Management    PT Next Visit Plan  nustep, strengthening of LE and UEs, core stability functional exercises.    Consulted and Agree with Plan of Care  Patient    Family Member Consulted  daughter-in-law       Patient will benefit from skilled  therapeutic intervention in order to improve the following deficits and impairments:  Abnormal gait, Decreased  safety awareness, Decreased balance, Decreased activity tolerance, Decreased strength  Visit Diagnosis: 1. Unsteadiness on feet   2. Difficulty in walking, not elsewhere classified        Problem List Patient Active Problem List   Diagnosis Date Noted  . Acute ischemic stroke ([place]) 03/25/2019  . Acute metabolic encephalopathy 47/30/8569  . Hyponatremia 03/24/2019  . Diabetes mellitus due to underlying condition with diabetic autonomic neuropathy, without long-term current use of insulin ([place]) 09/18/2015  . Annual physical exam 02/26/2015  . Type 2 diabetes mellitus with neurological manifestations, controlled ([place]) 08/29/2013  . Hypertension 11/13/2012  . Cataracts, bilateral 11/13/2012  . Symptomatic menopausal or female climacteric states   . Hyperlipidemia associated with type 2 diabetes mellitus ([place])   . Esophagitis   . 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 Obesity, mild   . IBS (irritable bowel syndrome)   . Kidney stone   . Lumbosacral spondylosis without myelopathy   . Pain in joint, shoulder region   . Colon polyp   . Diverticulosis    Gabriela Eves, PT, DPT 04/28/2019, 6:13 PM  [facility] [address] [place], [place], [address] Phone: [phone]   Fax:  [phone]  Name: Theresa Watson MRN: [id number] Date of Birth: 1933-02-11

## 2019-05-02 ENCOUNTER — Other Ambulatory Visit: Payer: Self-pay

## 2019-05-02 ENCOUNTER — Ambulatory Visit: Payer: Medicare Other | Admitting: Physical Therapy

## 2019-05-02 ENCOUNTER — Encounter: Payer: Self-pay | Admitting: Physical Therapy

## 2019-05-02 DIAGNOSIS — R2681 Unsteadiness on feet: Secondary | ICD-10-CM | POA: Diagnosis not present

## 2019-05-02 DIAGNOSIS — R262 Difficulty in walking, not elsewhere classified: Secondary | ICD-10-CM

## 2019-05-02 NOTE — Therapy (Signed)
[facility] [place], [place], [address] Phone: [phone]   Fax:  [phone]  Physical Therapy Treatment  Patient Details  Name: Theresa Watson MRN: [id number] Date of Birth: 12-09-1932 Referring Provider (PT): Donne Hazel, MD   Encounter Date: 05/02/2019  PT End of Session - 05/02/19 1359    Visit Number  9    Number of Visits  12    Date for PT Re-Evaluation  05/19/19    Authorization Type  Progress note every 10th visit    PT Start Time  1347    PT Stop Time  1429    PT Time Calculation (min)  42 min    Equipment Utilized During Treatment  Other (comment)   SPC   Activity Tolerance  Patient tolerated treatment well    Behavior During Therapy  [facility] for tasks assessed/performed       Past Medical History:  Diagnosis Date  . Colon polyp   . Diabetes mellitus without complication ([place])   . Diverticulosis   . Esophagitis   . IBS (irritable bowel syndrome)   . Kidney stone    hx of 1 kidney stone  . Lumbosacral spondylosis without myelopathy   . Obesity, mild   . Other and unspecified hyperlipidemia   . Pain in joint, shoulder region   . 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 Stroke ([place])   . Symptomatic menopausal or female climacteric states     Past Surgical History:  Procedure Laterality Date  . CARPAL TUNNEL RELEASE    . CHOLECYSTECTOMY    . EYE SURGERY Bilateral    cataracts  . lumbar back surgery    . VAGINAL HYSTERECTOMY     partial    There were no vitals filed for this visit.  Subjective Assessment - 05/02/19 1356    Subjective  COVID 19 screening performed on patient upon arrival. Daughter in law reports that patient left by herself at home over the week with no issues. Daughter in law states that the patient also made her own bed independently over the weekend.    Patient is accompained by:  Family member   Daughter in law   Pertinent History  CVA  03/24/2019, HTN, DM    Diagnostic tests  MRI: right thalmus CVA    Patient Stated Goals  become independent again    Currently in Pain?  Yes    Pain Score  --   No pain score provided but "not bad"   Pain Location  Knee    Pain Orientation  Right;Left    Pain Descriptors / Indicators  Discomfort    Pain Type  Chronic pain    Pain Onset  More than a month ago         [facility] PT Assessment - 05/02/19 0001      Assessment   Medical Diagnosis  Unsteadiness on feet, acute ichemic stroke    Referring Provider (PT)  Donne Hazel, MD    Onset Date/Surgical Date  03/24/19    Hand Dominance  Right    Next MD Visit  n/a    Prior Therapy  per daughter in law, in the hospital      Precautions   Precautions  Fall      ROM / Strength   AROM / PROM / Strength  Strength      Strength   Overall Strength  Within functional limits for tasks performed  Strength Assessment Site  Hip;Knee    Right/Left Hip  Right;Left    Right Hip Flexion  4/5    Right Hip External Rotation   4/5    Right Hip Internal Rotation  5/5    Left Hip Flexion  4/5    Left Hip External Rotation  4+/5    Left Hip Internal Rotation  5/5    Right/Left Knee  Right;Left    Right Knee Flexion  4+/5    Right Knee Extension  4+/5    Left Knee Flexion  4+/5    Left Knee Extension  4+/5                   [facility] Adult PT Treatment/Exercise - 05/02/19 0001      Knee/Hip Exercises: Aerobic   Nustep  L4 x15 min  seat5      Knee/Hip Exercises: Seated   Long Arc Quad  Strengthening;Both;3 sets;10 reps;Weights    Long Arc Quad Weight  4 lbs.    Clamshell with TheraBand  Green   x20 reps   Hamstring Curl  Strengthening;Both;3 sets;10 reps;Limitations    Hamstring Limitations  green theraband          Balance Exercises - 05/02/19 1446      Balance Exercises: Standing   Standing Eyes Opened  Narrow base of support (BOS);Foam/compliant surface;Head turns;Time   x2 min; attempted on floor too; patient  impulsive   Sidestepping  Foam/compliant support;Upper extremity support;5 reps    Heel Raises Limitations  x20 reps    Toe Raise Limitations  x20 reps    Other Standing Exercises  Turning then walk short distance x5 RT; DLS airex stand with ball reachout OH x15 reps             PT Long Term Goals - 05/02/19 1415      PT LONG TERM GOAL #1   Title  Patient will be independent HEP    Time  6    Period  Weeks    Status  Partially Met   "half the time"     PT LONG TERM GOAL #2   Title  Patient will improve LE functional strength as noted by the ability to perform modified 5x sit to stand test to 16 seconds.    Time  6    Period  Weeks    Status  Achieved   13 seconds     PT LONG TERM GOAL #3   Title  Patient will demonstrate LE strength to 4+/5 or greater to improve stability during functional tasks    Time  6    Period  Weeks    Status  Partially Met            Plan - 05/02/19 1545    Clinical Impression Statement  Patient presented in clinic with no complaints from over the weekend. Patient feels more indpendent at home now as she is being at home alone. Patient demonstrated good strength assessment and able to achieve sit to stand goal. Patient challenged by head turns with balance exercises but patient impulsive at times.    Personal Factors and Comorbidities  Age;Comorbidity 3+    Comorbidities  CVA 03/24/2019, HTN, DM    Examination-Activity Limitations  Bathing;Dressing;Hygiene/Grooming;Stairs    Stability/Clinical Decision Making  Stable/Uncomplicated    Rehab Potential  Good    PT Frequency  2x / week    PT Duration  6 weeks    PT Treatment/Interventions  Neuromuscular re-education;Gait training;Stair training;Functional mobility training;Therapeutic exercise;Balance training;Therapeutic activities;Patient/family education;Passive range of motion;Manual techniques;ADLs/Self Care Home Management    PT Next Visit Plan  D/C summary next treatment.    PT Home  Exercise Plan  see patient education section    Consulted and Agree with Plan of Care  Patient    Family Member Consulted  daughter-in-law       Patient will benefit from skilled therapeutic intervention in order to improve the following deficits and impairments:  Abnormal gait, Decreased safety awareness, Decreased balance, Decreased activity tolerance, Decreased strength  Visit Diagnosis: 1. Unsteadiness on feet   2. Difficulty in walking, not elsewhere classified        Problem List Patient Active Problem List   Diagnosis Date Noted  . Acute ischemic stroke ([place]) 03/25/2019  . Acute metabolic encephalopathy 16/60/6004  . Hyponatremia 03/24/2019  . Diabetes mellitus due to underlying condition with diabetic autonomic neuropathy, without long-term current use of insulin ([place]) 09/18/2015  . Annual physical exam 02/26/2015  . Type 2 diabetes mellitus with neurological manifestations, controlled ([place]) 08/29/2013  . Hypertension 11/13/2012  . Cataracts, bilateral 11/13/2012  . Symptomatic menopausal or female climacteric states   . Hyperlipidemia associated with type 2 diabetes mellitus ([place])   . Esophagitis   . 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 Obesity, mild   . IBS (irritable bowel syndrome)   . Kidney stone   . Lumbosacral spondylosis without myelopathy   . Pain in joint, shoulder region   . Colon polyp   . Diverticulosis     Standley Brooking, PTA 05/02/2019, 3:53 PM  [facility] [address] [place], [place], [address] Phone: [phone]   Fax:  [phone]  Name: Theresa Watson MRN: [id number] Date of Birth: Feb 25, 1933

## 2019-05-06 ENCOUNTER — Other Ambulatory Visit: Payer: Self-pay

## 2019-05-06 ENCOUNTER — Ambulatory Visit: Payer: Medicare Other | Admitting: Physical Therapy

## 2019-05-06 ENCOUNTER — Encounter: Payer: Self-pay | Admitting: Physical Therapy

## 2019-05-06 DIAGNOSIS — R262 Difficulty in walking, not elsewhere classified: Secondary | ICD-10-CM

## 2019-05-06 DIAGNOSIS — R2681 Unsteadiness on feet: Secondary | ICD-10-CM

## 2019-05-06 NOTE — Therapy (Signed)
[facility] [place], [place], [address] Phone: [phone]   Fax:  [phone]  Physical Therapy Treatment PHYSICAL THERAPY DISCHARGE SUMMARY  Visits from Start of Care: 10  Current functional level related to goals / functional outcomes: See below   Remaining deficits: See goals   Education / Equipment: HEP  Plan: Patient agrees to discharge.  Patient goals were partially met. Patient is being discharged due to being pleased with the current functional level.  ?????   Gabriela Eves, PT, DPT   Patient Details  Name: Theresa Watson MRN: [id number] Date of Birth: 08/11/1933 Referring Provider (PT): Donne Hazel, MD   Encounter Date: 05/06/2019  PT End of Session - 05/06/19 0950    Visit Number  10    Number of Visits  12    Date for PT Re-Evaluation  05/19/19    Authorization Type  Progress note every 10th visit    PT Start Time  0945    PT Stop Time  1026    PT Time Calculation (min)  41 min    Activity Tolerance  Patient tolerated treatment well    Behavior During Therapy  [facility] for tasks assessed/performed       Past Medical History:  Diagnosis Date  . Colon polyp   . Diabetes mellitus without complication ([place])   . Diverticulosis   . Esophagitis   . IBS (irritable bowel syndrome)   . Kidney stone    hx of 1 kidney stone  . Lumbosacral spondylosis without myelopathy   . Obesity, mild   . Other and unspecified hyperlipidemia   . Pain in joint, shoulder region   . 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 Stroke ([place])   . Symptomatic menopausal or female climacteric states     Past Surgical History:  Procedure Laterality Date  . CARPAL TUNNEL RELEASE    . CHOLECYSTECTOMY    . EYE SURGERY Bilateral    cataracts  . lumbar back surgery    . VAGINAL HYSTERECTOMY     partial    There were no vitals filed for this visit.  Subjective Assessment - 05/06/19 1204     Subjective  COVID-19 screening performed upon arrival. Patient arrives feeling good and feels ready to be at home by herself.    Patient is accompained by:  Family member    Pertinent History  CVA 03/24/2019, HTN, DM    Limitations  Walking;House hold activities    Diagnostic tests  MRI: right thalmus CVA    Patient Stated Goals  become independent again    Currently in Pain?  No/denies         [facility] PT Assessment - 05/06/19 0001      Assessment   Medical Diagnosis  Unsteadiness on feet, acute ichemic stroke    Referring Provider (PT)  Donne Hazel, MD    Onset Date/Surgical Date  03/24/19    Hand Dominance  Right    Next MD Visit  n/a    Prior Therapy  per daughter in law, in the hospital                   [facility] Adult PT Treatment/Exercise - 05/06/19 0001      Knee/Hip Exercises: Aerobic   Nustep  L4 x15 min  seat5      Knee/Hip Exercises: Standing   Heel Raises  Both;2 sets;10 reps    Hip Abduction  AROM;Both;2  sets;10 reps    Forward Step Up  Both;2 sets;10 reps;Hand Hold: 2;Step Height: 6"    Functional Squat  --      Knee/Hip Exercises: Seated   Long Arc Quad  Strengthening;Both;3 sets;10 reps;Weights    Long Arc Quad Weight  4 lbs.    Ball Squeeze  x2 minutes    Clamshell with Marga Hoots   x20         Balance Exercises - 05/06/19 1017      Balance Exercises: Standing   Standing Eyes Opened  Narrow base of support (BOS);2 reps;Foam/compliant surface;30 secs    Gait with Head Turns  Forward;2 reps   stop/go gait x2    Sidestepping  Foam/compliant support;Upper extremity support;5 reps    Heel Raises Limitations  x20 reps    Toe Raise Limitations  x20 reps             PT Long Term Goals - 05/02/19 1415      PT LONG TERM GOAL #1   Title  Patient will be independent HEP    Time  6    Period  Weeks    Status  Partially Met   "half the time"     PT LONG TERM GOAL #2   Title  Patient will improve LE functional strength as noted  by the ability to perform modified 5x sit to stand test to 16 seconds.    Time  6    Period  Weeks    Status  Achieved   13 seconds     PT LONG TERM GOAL #3   Title  Patient will demonstrate LE strength to 4+/5 or greater to improve stability during functional tasks    Time  6    Period  Weeks    Status  Partially Met            Plan - 05/06/19 1030    Clinical Impression Statement  Patient responded well to therapy and was able to complete all exercises with supervision. Patient was able to perform balance exercises with supervision. Patient and PT and pt's daughter in law discussed her staying at home by herself and scheduling time to check in with patient. Patient's goals were assessed last visit; goals partially met    Personal Factors and Comorbidities  Age;Comorbidity 3+    Comorbidities  CVA 03/24/2019, HTN, DM    Examination-Activity Limitations  Bathing;Dressing;Hygiene/Grooming;Stairs    Stability/Clinical Decision Making  Stable/Uncomplicated    Clinical Decision Making  Moderate    Rehab Potential  Good    PT Frequency  2x / week    PT Duration  6 weeks    PT Treatment/Interventions  Neuromuscular re-education;Gait training;Stair training;Functional mobility training;Therapeutic exercise;Balance training;Therapeutic activities;Patient/family education;Passive range of motion;Manual techniques;ADLs/Self Care Home Management    PT Next Visit Plan  DC    PT Home Exercise Plan  see patient education section    Consulted and Agree with Plan of Care  Patient    Family Member Consulted  daughter-in-law       Patient will benefit from skilled therapeutic intervention in order to improve the following deficits and impairments:  Abnormal gait, Decreased safety awareness, Decreased balance, Decreased activity tolerance, Decreased strength  Visit Diagnosis: 1. Unsteadiness on feet   2. Difficulty in walking, not elsewhere classified        Problem List Patient Active  Problem List   Diagnosis Date Noted  . Acute ischemic stroke ([place]) 03/25/2019  .  Acute metabolic encephalopathy 09/32/6712  . Hyponatremia 03/24/2019  . Diabetes mellitus due to underlying condition with diabetic autonomic neuropathy, without long-term current use of insulin ([place]) 09/18/2015  . Annual physical exam 02/26/2015  . Type 2 diabetes mellitus with neurological manifestations, controlled ([place]) 08/29/2013  . Hypertension 11/13/2012  . Cataracts, bilateral 11/13/2012  . Symptomatic menopausal or female climacteric states   . Hyperlipidemia associated with type 2 diabetes mellitus ([place])   . Esophagitis   . 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 Obesity, mild   . IBS (irritable bowel syndrome)   . Kidney stone   . Lumbosacral spondylosis without myelopathy   . Pain in joint, shoulder region   . Colon polyp   . Diverticulosis    Gabriela Eves, PT, DPT 05/06/2019, 12:20 PM  [facility] [address] [place], [place], [address] Phone: [phone]   Fax:  [phone]  Name: Theresa Watson MRN: [id number] Date of Birth: [birth year]

## 2019-05-11 ENCOUNTER — Ambulatory Visit (INDEPENDENT_AMBULATORY_CARE_PROVIDER_SITE_OTHER): Payer: Medicare Other | Admitting: Adult Health

## 2019-05-11 ENCOUNTER — Other Ambulatory Visit: Payer: Self-pay

## 2019-05-11 ENCOUNTER — Encounter: Payer: Self-pay | Admitting: Adult Health

## 2019-05-11 VITALS — BP 127/71 | HR 65 | Temp 98.0°F | Ht 63.0 in | Wt 153.0 lb

## 2019-05-11 DIAGNOSIS — I10 Essential (primary) hypertension: Secondary | ICD-10-CM

## 2019-05-11 DIAGNOSIS — E1149 Type 2 diabetes mellitus with other diabetic neurological complication: Secondary | ICD-10-CM

## 2019-05-11 DIAGNOSIS — I639 Cerebral infarction, unspecified: Secondary | ICD-10-CM | POA: Diagnosis not present

## 2019-05-11 DIAGNOSIS — E785 Hyperlipidemia, unspecified: Secondary | ICD-10-CM | POA: Diagnosis not present

## 2019-05-11 DIAGNOSIS — I6381 Other cerebral infarction due to occlusion or stenosis of small artery: Secondary | ICD-10-CM

## 2019-05-11 NOTE — Progress Notes (Signed)
[facility] [address] [place]. [place] [address] [phone]       HOSPITAL FOLLOW UP NOTE  Theresa Watson Date of Birth:  [birth year] Medical Record Number:  [id number]   Reason for Referral:  hospital stroke follow up    CHIEF COMPLAINT:  Chief Complaint  Patient presents with   Follow-up    Room 9, with daughter inlaw. Hospital f/u for stroke.    HPI: Theresa Watson being seen today for in office hospital follow-up regarding right thalamic infarct secondary to small vessel disease on 03/25/2019.  History obtained from patient, daughter-in-law and chart review. Reviewed all radiology images and labs personally.  Theresa Watson is a 85 y.o. female with history of diabetes, diverticulitis, remote hx of tobacco use and obesity  presented to [facility] ED on 03/25/2019 with confusion and speech difficulties.   Stroke work-up completed which showed right thalamic infarct secondary to small vessel disease source.  She did not receive IV t-PA due to late presentation (>4.5 hours from time of onset).  MRI head showed small area of acute infarction suspected in the right thalamus.  Carotid Dopplers unremarkable.  Transcranial Doppler showed suboptimal absent bitemporal windows.  2D echo unremarkable.  LDL 60 and A1c 7.2.  Recommended continuation of aspirin 81 mg daily and pravastatin 80 mg daily and ongoing follow-up with PCP for DM and HTN management.  Other stroke risk factors include advanced age, former tobacco use and overweight but no prior history of stroke.  Discharged home in stable condition with residual decreased mild left hand dexterity with recommendation of outpatient PT for unsteadiness on feet with bilateral lower extremity weakness.  Residual deficits mild right lower extremity weakness and decreased right hand dexterity but overall recovering well.  She is currently using a cane for ambulation while outdoors and denies any recent falls.  She  initially was discharged home with her daughter but has since returned to her own home living independently without difficulty.  She does have family that lives close by who checks on her frequently.  She is able to maintain ADLs and majority of IADLs independently and only minimal assistance by family such as doing laundry.  Completed physical therapy on 05/06/2019 and recommended ongoing HEP for ongoing improvement  Continues on aspirin 81 mg without bleeding or bruising Continues on pravastatin 80 mg daily without myalgias Blood pressure satisfactory today at 127/71 No further concerns at this time    ROS:   14 system review of systems performed and negative with exception of ambulation difficulty  PMH:  Past Medical History:  Diagnosis Date   Colon polyp    Diabetes mellitus without complication (HCC)    Diverticulosis    Esophagitis    IBS (irritable bowel syndrome)    Kidney stone    hx of 1 kidney stone   Lumbosacral spondylosis without myelopathy    Obesity, mild    Other and unspecified hyperlipidemia    Pain in joint, shoulder region    Prolapse of vaginal walls without mention of uterine prolapse    Stroke ([place])    Symptomatic menopausal or female climacteric states     PSH:  Past Surgical History:  Procedure Laterality Date   CARPAL TUNNEL RELEASE     CHOLECYSTECTOMY     EYE SURGERY Bilateral    cataracts   lumbar back surgery     VAGINAL HYSTERECTOMY     partial    Social History:  Social History  Socioeconomic History   Marital status: Divorced    Spouse name: Not on file   Number of children: 5   Years of education: GED   Highest education level: GED or equivalent  Occupational History   Occupation: retired  Scientist, product/process development strain: Not hard at all   Food insecurity    Worry: Never true    Inability: Never true   Transportation needs    Medical: No    Non-medical: No  Tobacco Use   Smoking  status: Former Smoker    Packs/day: 0.50    Types: Cigarettes    Start date: 10/06/1953    Quit date: 02/04/2012    Years since quitting: 7.2   Smokeless tobacco: Never Used  Substance and Sexual Activity   Alcohol use: No   Drug use: No   Sexual activity: Not Currently  Lifestyle   Physical activity    Days per week: 7 days    Minutes per session: 20 min   Stress: To some extent  Relationships   Social connections    Talks on phone: More than three times a week    Gets together: More than three times a week    Attends religious service: More than 4 times per year    Active member of club or organization: Yes    Attends meetings of clubs or organizations: More than 4 times per year    Relationship status: Divorced   Intimate partner violence    Fear of current or ex partner: No    Emotionally abused: No    Physically abused: No    Forced sexual activity: No  Other Topics Concern   Not on file  Social History Narrative   Not on file    Family History:  Family History  Problem Relation Age of Onset   Cancer Mother        brain   Brain cancer Mother    GI Bleed Father    Arthritis Sister        rheumatoid   Lymphoma Sister    Pancreatic cancer Brother    Diabetes Son    Arthritis Son        back   Cancer Son        prostate   Cancer Maternal Grandfather    Suicidality Paternal Grandfather    Arthritis Son        back   Hyperlipidemia Son    Colitis Son     Medications:   Current Outpatient Medications on File Prior to Visit  Medication Sig Dispense Refill   ACCU-CHEK AVIVA PLUS test strip TEST TWO TIMES DAILY AS  DIRECTED 100 each 7   aspirin 81 MG EC tablet Take 81 mg by mouth daily.       Calcium Carb-Cholecalciferol (CALCIUM 600 + D PO) Take 1 tablet by mouth daily.      Cholecalciferol (VITAMIN D3) 2000 UNITS TABS Take 1 tablet by mouth daily.       DULoxetine (CYMBALTA) 30 MG capsule Take 1 capsule (30 mg total) by mouth  daily. 30 capsule 0   ferrous sulfate 325 (65 FE) MG EC tablet Take 325 mg by mouth daily with breakfast.       gabapentin (NEURONTIN) 100 MG capsule Take 1 cap PO in the AM and 3 caps po QHS 151 capsule 3   Garlic 761 MG TABS Take 1 tablet by mouth 2 (two) times daily.      geriatric  multivitamins-minerals (ELDERTONIC/GEVRABON) ELIX Take 15 mLs by mouth daily.       nystatin cream (MYCOSTATIN) Apply topically 2 (two) times daily. To affected areas 30 g 0   Omega-3 Fatty Acids (FISH OIL) 1000 MG CAPS Take 2 capsules by mouth daily.      omeprazole (PRILOSEC) 20 MG capsule Take 20 mg by mouth daily.       potassium chloride SA (K-DUR,KLOR-CON) 20 MEQ tablet TAKE 1 TABLET BY MOUTH  DAILY 90 tablet 1   pravastatin (PRAVACHOL) 80 MG tablet TAKE 1 TABLET BY MOUTH  DAILY 90 tablet 1   traZODone (DESYREL) 50 MG tablet Take 1 tablet (50 mg total) by mouth at bedtime. Use from 1/3 to 1 tablet nightly as needed for sleep. 30 tablet 2   triamterene-hydrochlorothiazide (MAXZIDE-25) 37.5-25 MG tablet TAKE 1 TABLET BY MOUTH  DAILY 90 tablet 1   vitamin C (ASCORBIC ACID) 500 MG tablet Take 500 mg by mouth daily.     VITAMIN E PO Take 1 capsule by mouth daily.     No current facility-administered medications on file prior to visit.     Allergies:   Allergies  Allergen Reactions   Levaquin [Levofloxacin] Swelling    tongue   Cephalexin    Penicillins     .Did it involve swelling of the face/tongue/throat, SOB, or low BP? Unknown Did it involve sudden or severe rash/hives, skin peeling, or any reaction on the inside of your mouth or nose? Unknown Did you need to seek medical attention at a hospital or doctor's office? Unknown When did it last happen? If all above answers are NO, may proceed with cephalosporin use.    Lisinopril Cough     Physical Exam  Vitals:   05/11/19 0851  BP: 127/71  Pulse: 65  Temp: 98 F (36.7 C)  Weight: 153 lb (69.4 kg)  Height: 5\' 3"  (1.6  m)   Body mass index is 27.1 kg/m. No exam data present  Depression screen [facility] 2/9 05/11/2019  Decreased Interest 0  Down, Depressed, Hopeless 0  PHQ - 2 Score 0  Altered sleeping -  Tired, decreased energy -  Change in appetite -  Feeling bad or failure about yourself  -  Trouble concentrating -  Moving slowly or fidgety/restless -  Suicidal thoughts -  PHQ-9 Score -  Difficult doing work/chores -     General: well developed, well nourished,  pleasant elderly Caucasian female, seated, in no evident distress Head: head normocephalic and atraumatic.   Neck: supple with no carotid or supraclavicular bruits Cardiovascular: regular rate and rhythm, no murmurs Musculoskeletal: no deformity Skin:  no rash/petichiae Vascular:  Normal pulses all extremities   Neurologic Exam Mental Status: Awake and fully alert. Oriented to place and time. Recent and remote memory intact. Attention span, concentration and fund of knowledge appropriate. Mood and affect appropriate.  Cranial Nerves: Fundoscopic exam reveals sharp disc margins. Pupils equal, briskly reactive to light. Extraocular movements full without nystagmus. Visual fields full to confrontation. Hearing intact. Facial sensation intact. Face, tongue, palate moves normally and symmetrically.  Motor: Normal bulk and tone.  Mild bilateral hip flexor weakness left greater than right and mild left ankle dorsiflexion weakness Sensory.: intact to touch , pinprick , position and vibratory sensation.  Coordination: Rapid alternating movements normal in all extremities except slightly diminished in left hand. Finger-to-nose and heel-to-shin performed accurately bilaterally.  Orbits right arm over left arm. Gait and Station: Arises from chair without difficulty. Stance is  normal. Gait demonstrates normal stride length and balance with use of cane Reflexes: 1+ and symmetric. Toes downgoing.     NIHSS  0 Modified Rankin  2    Diagnostic Data  (Labs, Imaging, Testing)  MRI Brain Wo Contrast 03/24/19 IMPRESSION: Small area of acute infarction suspected in the right thalamus. No swelling or mass effect. Chronic small-vessel ischemic changes elsewhere throughout the brain as outlined above.   CT Head WO Contrast 03/24/19 IMPRESSION: 1. No acute intracranial abnormality. 2. Mild atrophy and chronic microvascular ischemic changes.   Transthoracic Echocardiogram  IMPRESSIONS 1. The left ventricle has hyperdynamic systolic function, with an ejection fraction of >65%. The cavity size was normal. Left ventricular diastolic parameters were normal. 2. The right ventricle has normal systolic function. The cavity was normal. There is no increase in right ventricular wall thickness. 3. Mild thickening of the mitral valve leaflet. 4. The tricuspid valve is grossly normal. 5. The aortic valve is tricuspid. Mild thickening of the aortic valve.   Bilateral Carotid Dopplers -bilateral 1-39% stenosis.   Transcranial Dopplers -absent bitemporal windows limits the study..  Antegrade flow in both carotid siphons and ophthalmics.  EKG - ST rate 100 BPM. (See cardiology reading for complete details)   ASSESSMENT: Carianne MYISHA PICKEREL is a 85 y.o. year old female presented to [facility] ED on 03/25/2019 with stroke work-up revealing right chronic infarct secondary to small vessel disease. Vascular risk factors include HTN, HLD, DM and prior tobacco use.  Recovered well from a stroke standpoint with only mild residual LUE weakness and left hand decreased dexterity    PLAN:  1. Right thalamic infarct: Continue aspirin 81 mg daily  and pravastatin for secondary stroke prevention. Maintain strict control of hypertension with blood pressure goal below 130/90, diabetes with hemoglobin A1c goal below 6.5% and cholesterol with LDL cholesterol (bad cholesterol) goal below 70 mg/dL.  I also advised the patient to eat a healthy diet with plenty of  whole grains, cereals, fruits and vegetables, exercise regularly with at least 30 minutes of continuous activity daily and maintain ideal body weight.  Advised to continue exercises at home as recommended by therapy. 2. HTN: Advised to continue current treatment regimen.  Today's BP stable.  Advised to continue to monitor at home along with continued follow-up with PCP for management 3. HLD: Advised to continue current treatment regimen along with continued follow-up with PCP for future prescribing and monitoring of lipid panel 4. DMII: Advised to continue to monitor glucose levels at home along with continued follow-up with PCP for management and monitoring   Patient request follow-up as needed due to long drive to [place] from her home.  She does endorse following up with her PCP on a regular basis.  She was advised to call office with any questions or concerns in the future regarding her stroke   Greater than 50% of time during this 45 minute visit was spent on counseling, explanation of diagnosis of right thalamic infarct, reviewing risk factor management of HTN, HLD and DM, planning of further management along with potential future management, and discussion with patient and family answering all questions.    Venancio Poisson, AGNP-BC  [facility] [address] [place],  [address]  Phone [phone] Fax [phone] Note: This document was prepared with digital dictation and possible smart phrase technology. Any transcriptional errors that result from this process are unintentional.

## 2019-05-11 NOTE — Patient Instructions (Addendum)
Continue aspirin 81 mg daily  and pravastatin 80 mg daily for secondary stroke prevention  Continue to follow up with PCP regarding cholesterol, blood pressure and diabetes management   Continue exercises at home as recommended during therapy sessions for ongoing strengthening  Continue to monitor blood pressure at home  Maintain strict control of hypertension with blood pressure goal below 130/90, diabetes with hemoglobin A1c goal below 6.5% and cholesterol with LDL cholesterol (bad cholesterol) goal below 70 mg/dL. I also advised the patient to eat a healthy diet with plenty of whole grains, cereals, fruits and vegetables, exercise regularly and maintain ideal body weight.       Thank you for coming to see [place] at [facility]. I hope we have been able to provide you high quality care today.  You may receive a patient satisfaction survey over the next few weeks. We would appreciate your feedback and comments so that we may continue to improve ourselves and the health of our patients.

## 2019-05-16 NOTE — Progress Notes (Signed)
I agree with the above plan 

## 2019-05-20 ENCOUNTER — Other Ambulatory Visit: Payer: Self-pay | Admitting: Family Medicine

## 2019-08-08 ENCOUNTER — Other Ambulatory Visit: Payer: Self-pay | Admitting: Family Medicine

## 2019-11-29 ENCOUNTER — Other Ambulatory Visit: Payer: Self-pay

## 2019-11-30 ENCOUNTER — Ambulatory Visit (INDEPENDENT_AMBULATORY_CARE_PROVIDER_SITE_OTHER): Payer: Medicare Other | Admitting: Family Medicine

## 2019-11-30 ENCOUNTER — Encounter: Payer: Self-pay | Admitting: Family Medicine

## 2019-11-30 ENCOUNTER — Other Ambulatory Visit: Payer: Self-pay

## 2019-11-30 VITALS — BP 136/68 | HR 66 | Temp 98.4°F | Ht 63.0 in | Wt 147.0 lb

## 2019-11-30 DIAGNOSIS — I10 Essential (primary) hypertension: Secondary | ICD-10-CM

## 2019-11-30 DIAGNOSIS — E785 Hyperlipidemia, unspecified: Secondary | ICD-10-CM | POA: Diagnosis not present

## 2019-11-30 DIAGNOSIS — E1149 Type 2 diabetes mellitus with other diabetic neurological complication: Secondary | ICD-10-CM | POA: Diagnosis not present

## 2019-11-30 DIAGNOSIS — E7800 Pure hypercholesterolemia, unspecified: Secondary | ICD-10-CM | POA: Diagnosis not present

## 2019-11-30 DIAGNOSIS — E1143 Type 2 diabetes mellitus with diabetic autonomic (poly)neuropathy: Secondary | ICD-10-CM

## 2019-11-30 DIAGNOSIS — G4701 Insomnia due to medical condition: Secondary | ICD-10-CM | POA: Diagnosis not present

## 2019-11-30 DIAGNOSIS — E1169 Type 2 diabetes mellitus with other specified complication: Secondary | ICD-10-CM | POA: Diagnosis not present

## 2019-11-30 DIAGNOSIS — E0843 Diabetes mellitus due to underlying condition with diabetic autonomic (poly)neuropathy: Secondary | ICD-10-CM

## 2019-11-30 DIAGNOSIS — E559 Vitamin D deficiency, unspecified: Secondary | ICD-10-CM

## 2019-11-30 LAB — BAYER DCA HB A1C WAIVED: HB A1C (BAYER DCA - WAIVED): 6.8 % (ref <7.0)

## 2019-11-30 MED ORDER — OMEPRAZOLE 20 MG PO CPDR: 20.0000 mg | DELAYED_RELEASE_CAPSULE | Freq: Every day | ORAL | 1 refills | Status: DC

## 2019-11-30 MED ORDER — GABAPENTIN 300 MG PO CAPS: 300.0000 mg | ORAL_CAPSULE | Freq: Two times a day (BID) | ORAL | 1 refills | Status: DC

## 2019-11-30 MED ORDER — PRAVASTATIN SODIUM 80 MG PO TABS: 80.0000 mg | ORAL_TABLET | Freq: Every day | ORAL | 3 refills | Status: DC

## 2019-11-30 MED ORDER — TRIAMTERENE-HCTZ 37.5-25 MG PO TABS: 1.0000 | ORAL_TABLET | Freq: Every day | ORAL | 3 refills | Status: DC

## 2019-11-30 NOTE — Patient Instructions (Signed)
Use Gas-EX before each meal and at bedtime to help reduce gas pressure.

## 2019-11-30 NOTE — Progress Notes (Signed)
Subjective:  Patient ID: Theresa Watson,  female    DOB: 1932-10-22  Age: 86 y.o.    CC: Follow-up (6 month)   HPI Theresa Watson presents for  follow-up of hypertension. Patient has no history of headache chest pain or shortness of breath or recent cough. Patient also denies symptoms of TIA such as numbness weakness lateralizing.  She does have some mild physical deficits says her caregiver who is with her here today.  These are related to the stroke she suffered last summer.  Patient denies side effects from medication. States taking it regularly.  Patient also  in for follow-up of elevated cholesterol. Doing well without complaints on current medication. Denies side effects  including myalgia and arthralgia and nausea. Also in today for liver function testing. Currently no chest pain, shortness of breath or other cardiovascular related symptoms noted.  Follow-up of diabetes. Patient does check blood sugar at home. Readings run between 120 and 150.  Extensive logs from last year up until today were brought in.  These will be scanned and attached after having been reviewed with the patient today. Patient denies symptoms such as excessive hunger or urinary frequency, excessive hunger, nausea No significant hypoglycemic spells noted. Medications reviewed.  Patient is maintained on diet alone currently.  This is because her sugars have been doing well and she had to stop the Metformin due to diarrhea.  Metformin.    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   She has a past surgical history that  includes Carpal tunnel release; Cholecystectomy; Vaginal hysterectomy; lumbar back surgery; and Eye surgery (Bilateral).   Her family history includes Arthritis in her sister, son, and son; Brain cancer in her mother; Cancer in her maternal grandfather, mother, and son; Colitis in her son; Diabetes in her son; GI Bleed in her father; Hyperlipidemia in her son; Lymphoma in her sister; Pancreatic cancer in her brother; Suicidality in her paternal grandfather.She reports that she quit smoking about 7 years ago. Her smoking use included cigarettes. She started smoking about 66 years ago. She smoked 0.50 packs per day. She has never used smokeless tobacco. She reports that she does not drink alcohol or use drugs.  Current Outpatient Medications on File Prior to Visit  Medication Sig Dispense Refill  . ACCU-CHEK AVIVA PLUS test strip TEST TWO TIMES DAILY AS  DIRECTED 100 each 7  . aspirin 81 MG EC tablet Take 81 mg by mouth daily.      . Calcium Carb-Cholecalciferol (CALCIUM 600 + D PO) Take 1 tablet by mouth daily.     . Cholecalciferol (VITAMIN D3) 2000 UNITS TABS Take 1 tablet by mouth daily.      . ferrous sulfate 325 (65 FE) MG EC tablet Take 325 mg by mouth daily with breakfast.      . Garlic 270 MG TABS Take 1 tablet by mouth 2 (two) times daily.     Marland Kitchen geriatric multivitamins-minerals (ELDERTONIC/GEVRABON) ELIX Take 15 mLs by mouth daily.      . Omega-3 Fatty Acids (FISH OIL) 1000 MG CAPS Take 2 capsules by mouth daily.     . potassium chloride SA (KLOR-CON) 20 MEQ tablet TAKE 1 TABLET BY  MOUTH  DAILY 90 tablet 3  . vitamin C (ASCORBIC ACID) 500 MG tablet Take 500 mg by mouth daily.    Marland Kitchen VITAMIN E PO Take 1 capsule by mouth daily.     No current facility-administered medications on file prior to visit.    ROS Review of Systems  Constitutional: Negative.   HENT: Negative for congestion.   Eyes: Negative for visual disturbance.  Respiratory: Negative for shortness of breath.     Cardiovascular: Negative for chest pain.  Gastrointestinal: Positive for abdominal distention (Passes excessive gas). Negative for abdominal pain, constipation, diarrhea, nausea and vomiting.  Genitourinary: Negative for difficulty urinating.  Musculoskeletal: Negative for arthralgias and myalgias.  Neurological: Negative for headaches.  Psychiatric/Behavioral: Positive for sleep disturbance.    Objective:  BP 136/68   Pulse 66   Temp 98.4 F (36.9 C) (Temporal)   Ht 5' 3"  (1.6 m)   Wt 147 lb (66.7 kg)   BMI 26.04 kg/m   BP Readings from Last 3 Encounters:  11/30/19 136/68  05/11/19 127/71  03/28/19 120/64    Wt Readings from Last 3 Encounters:  11/30/19 147 lb (66.7 kg)  05/11/19 153 lb (69.4 kg)  03/25/19 152 lb 12.5 oz (69.3 kg)     Physical Exam Constitutional:      General: She is not in acute distress.    Appearance: She is well-developed.  HENT:     Head: Normocephalic and atraumatic.     Right Ear: External ear normal.     Left Ear: External ear normal.     Nose: Nose normal.  Eyes:     Conjunctiva/sclera: Conjunctivae normal.     Pupils: Pupils are equal, round, and reactive to light.  Neck:     Thyroid: No thyromegaly.  Cardiovascular:     Rate and Rhythm: Normal rate and regular rhythm.     Heart sounds: Normal heart sounds. No murmur.  Pulmonary:     Effort: Pulmonary effort is normal. No respiratory distress.     Breath sounds: Normal breath sounds. No wheezing or rales.  Abdominal:     General: Bowel sounds are normal. There is no distension.     Palpations: Abdomen is soft.     Tenderness: There is no abdominal tenderness.  Musculoskeletal:     Cervical back: Normal range of motion and neck supple.  Lymphadenopathy:     Cervical: No cervical adenopathy.  Skin:    General: Skin is warm and dry.  Neurological:     Mental Status: She is alert and oriented to person, place, and time.     Deep Tendon Reflexes: Reflexes are normal and  symmetric.  Psychiatric:        Behavior: Behavior normal.        Thought Content: Thought content normal.        Judgment: Judgment normal.     Diabetic Foot Exam - Simple   Simple Foot Form Diabetic Foot exam was performed with the following findings: Yes 11/30/2019 10:00 AM  Visual Inspection No deformities, no ulcerations, no other skin breakdown bilaterally: Yes Sensation Testing Intact to touch and monofilament testing bilaterally: Yes Pulse Check Posterior Tibialis and Dorsalis pulse intact bilaterally: Yes Comments       Assessment & Plan:   Theresa Watson was seen today for follow-up.  Diagnoses and all orders for this visit:  Insomnia due to medical condition -     CBC with Differential/Platelet -     CMP14+EGFR  Pure hypercholesterolemia -  CBC with Differential/Platelet -     CMP14+EGFR -     Lipid panel  Essential hypertension -     CBC with Differential/Platelet -     CMP14+EGFR  Hyperlipidemia associated with type 2 diabetes mellitus (HCC) -     CBC with Differential/Platelet -     CMP14+EGFR -     Lipid panel  Vitamin D deficiency -     CBC with Differential/Platelet -     CMP14+EGFR -     VITAMIN D 25 Hydroxy (Vit-D Deficiency, Fractures)  Type 2 diabetes mellitus with neurological manifestations, controlled (HCC) -     CBC with Differential/Platelet -     CMP14+EGFR -     Lipid panel -     Microalbumin / creatinine urine ratio -     Bayer DCA Hb A1c Waived  Diabetic autonomic neuropathy associated with type 2 diabetes mellitus (HCC) -     CBC with Differential/Platelet -     CMP14+EGFR  Diabetes mellitus due to underlying condition with diabetic autonomic neuropathy, without long-term current use of insulin (HCC) -     CBC with Differential/Platelet -     CMP14+EGFR  Other orders -     gabapentin (NEURONTIN) 300 MG capsule; Take 1 capsule (300 mg total) by mouth 2 (two) times daily. TAKE 1 CAPSULE BY MOUTH IN  THE MORNING AND 3  CAPSULES  BY MOUTH AT BEDTIME -     omeprazole (PRILOSEC) 20 MG capsule; Take 1 capsule (20 mg total) by mouth daily. -     triamterene-hydrochlorothiazide (MAXZIDE-25) 37.5-25 MG tablet; Take 1 tablet by mouth daily. -     pravastatin (PRAVACHOL) 80 MG tablet; Take 1 tablet (80 mg total) by mouth daily.   I have discontinued Kerby M. Fair's nystatin cream, DULoxetine, and traZODone. I have also changed her gabapentin, omeprazole, triamterene-hydrochlorothiazide, and pravastatin. Additionally, I am having her maintain her aspirin, Vitamin D3, geriatric multivitamins-minerals, Fish Oil, ferrous sulfate, Garlic, Calcium Carb-Cholecalciferol (CALCIUM 600 + D PO), Accu-Chek Aviva Plus, vitamin C, VITAMIN E PO, and potassium chloride SA.  Meds ordered this encounter  Medications  . gabapentin (NEURONTIN) 300 MG capsule    Sig: Take 1 capsule (300 mg total) by mouth 2 (two) times daily. TAKE 1 CAPSULE BY MOUTH IN  THE MORNING AND 3 CAPSULES  BY MOUTH AT BEDTIME    Dispense:  180 capsule    Refill:  1    Requesting 1 year supply  . omeprazole (PRILOSEC) 20 MG capsule    Sig: Take 1 capsule (20 mg total) by mouth daily.    Dispense:  90 capsule    Refill:  1  . triamterene-hydrochlorothiazide (MAXZIDE-25) 37.5-25 MG tablet    Sig: Take 1 tablet by mouth daily.    Dispense:  90 tablet    Refill:  3    Requesting 1 year supply  . pravastatin (PRAVACHOL) 80 MG tablet    Sig: Take 1 tablet (80 mg total) by mouth daily.    Dispense:  90 tablet    Refill:  3    Requesting 1 year supply   Although she is currently under diet control for her diabetes she does have a history of diabetic neuropathy.  Since this was flaring up today I asked her to increase her gabapentin to 300 mg twice daily.  I switched her over from a 100 mg to 300 mg capsule to facilitate that change.  Of note is that her A1c was excellent today  at 6.8.  Therefore I would like for her to stay on diet control for her diabetes.   Exercise would be ideal but she is limited by her previous stroke.  She is stable with regard to her reflux, hypertension, hypercholesterolemia.  Blood work ordered for these today.  Of note is that her anxieties and depression seem to have encouraged improved she is now in remission and is no longer taking the duloxetine.  She also is no longer taking trazodone to help with sleep but doing okay with respect to that condition.  Follow-up: No follow-ups on file.  Claretta Fraise, M.D.

## 2019-12-01 LAB — CBC WITH DIFFERENTIAL/PLATELET
Basophils Absolute: 0 10*3/uL (ref 0.0–0.2)
Basos: 1 %
EOS (ABSOLUTE): 0.3 10*3/uL (ref 0.0–0.4)
Eos: 4 %
Hematocrit: 34.4 % (ref 34.0–46.6)
Hemoglobin: 12 g/dL (ref 11.1–15.9)
Immature Grans (Abs): 0 10*3/uL (ref 0.0–0.1)
Immature Granulocytes: 1 %
Lymphocytes Absolute: 2.8 10*3/uL (ref 0.7–3.1)
Lymphs: 40 %
MCH: 32.9 pg (ref 26.6–33.0)
MCHC: 34.9 g/dL (ref 31.5–35.7)
MCV: 94 fL (ref 79–97)
Monocytes Absolute: 0.6 10*3/uL (ref 0.1–0.9)
Monocytes: 8 %
Neutrophils Absolute: 3.3 10*3/uL (ref 1.4–7.0)
Neutrophils: 46 %
Platelets: 313 10*3/uL (ref 150–450)
RBC: 3.65 x10E6/uL — ABNORMAL LOW (ref 3.77–5.28)
RDW: 11.4 % — ABNORMAL LOW (ref 11.7–15.4)
WBC: 7 10*3/uL (ref 3.4–10.8)

## 2019-12-01 LAB — CMP14+EGFR
ALT: 18 IU/L (ref 0–32)
AST: 18 IU/L (ref 0–40)
Albumin/Globulin Ratio: 2 (ref 1.2–2.2)
Albumin: 4.3 g/dL (ref 3.6–4.6)
Alkaline Phosphatase: 76 IU/L (ref 39–117)
BUN/Creatinine Ratio: 19 (ref 12–28)
BUN: 16 mg/dL (ref 8–27)
Bilirubin Total: 0.4 mg/dL (ref 0.0–1.2)
CO2: 26 mmol/L (ref 20–29)
Calcium: 10.6 mg/dL — ABNORMAL HIGH (ref 8.7–10.3)
Chloride: 96 mmol/L (ref 96–106)
Creatinine, Ser: 0.86 mg/dL (ref 0.57–1.00)
GFR calc Af Amer: 72 mL/min/{1.73_m2} (ref >59)
GFR calc non Af Amer: 62 mL/min/{1.73_m2} (ref >59)
Globulin, Total: 2.2 g/dL (ref 1.5–4.5)
Glucose: 141 mg/dL — ABNORMAL HIGH (ref 65–99)
Potassium: 4 mmol/L (ref 3.5–5.2)
Sodium: 137 mmol/L (ref 134–144)
Total Protein: 6.5 g/dL (ref 6.0–8.5)

## 2019-12-01 LAB — LIPID PANEL
Chol/HDL Ratio: 3.6 ratio (ref 0.0–4.4)
Cholesterol, Total: 151 mg/dL (ref 100–199)
HDL: 42 mg/dL (ref >39)
LDL Chol Calc (NIH): 75 mg/dL (ref 0–99)
Triglycerides: 203 mg/dL — ABNORMAL HIGH (ref 0–149)
VLDL Cholesterol Cal: 34 mg/dL (ref 5–40)

## 2019-12-01 LAB — MICROALBUMIN / CREATININE URINE RATIO
Creatinine, Urine: 41.9 mg/dL
Microalb/Creat Ratio: 47 mg/g creat — ABNORMAL HIGH (ref 0–29)
Microalbumin, Urine: 19.6 ug/mL

## 2019-12-01 LAB — VITAMIN D 25 HYDROXY (VIT D DEFICIENCY, FRACTURES): Vit D, 25-Hydroxy: 39 ng/mL (ref 30.0–100.0)

## 2019-12-06 ENCOUNTER — Other Ambulatory Visit: Payer: Self-pay | Admitting: *Deleted

## 2019-12-06 MED ORDER — ACCU-CHEK AVIVA PLUS VI STRP: ORAL_STRIP | 3 refills | Status: DC

## 2019-12-06 MED ORDER — GABAPENTIN 300 MG PO CAPS: 300.0000 mg | ORAL_CAPSULE | Freq: Two times a day (BID) | ORAL | 0 refills | Status: DC

## 2019-12-07 ENCOUNTER — Other Ambulatory Visit: Payer: Self-pay | Admitting: *Deleted

## 2019-12-07 MED ORDER — GABAPENTIN 300 MG PO CAPS: 300.0000 mg | ORAL_CAPSULE | Freq: Two times a day (BID) | ORAL | 0 refills | Status: DC

## 2019-12-07 NOTE — Telephone Encounter (Signed)
Fax from [facility] Gabapentin Please clarify directions, 2 sets

## 2019-12-09 ENCOUNTER — Telehealth: Payer: Self-pay | Admitting: Family Medicine

## 2019-12-09 NOTE — Chronic Care Management (AMB) (Signed)
  Chronic Care Management   Outreach Note  12/09/2019 Name: Theresa Watson MRN: [id number] DOB: 1932-12-07  Theresa Watson is a 86 y.o. year old female who is a primary care patient of [name], [name], MD. I reached out to Theresa Watson by phone today in response to a referral sent by Theresa Watson's health plan.     An unsuccessful telephone outreach was attempted today. The patient was referred to the case management team for assistance with care management and care coordination.   Follow Up Plan: A HIPPA compliant phone message was left for the patient providing contact information and requesting a return call.  The care management team will reach out to the patient again over the next 7 days.  If patient returns call to provider office, please advise to call [facility] at [phone].  Noreene Larsson, [place], [place], [place] [address] Direct Dial: [phone] [email or web address] Website: [email or web address]

## 2019-12-12 NOTE — Chronic Care Management (AMB) (Signed)
  Chronic Care Management   Outreach Note  12/12/2019 Name: Theresa Watson MRN: [id number] DOB: 1932-10-29  Theresa Watson is a 86 y.o. year old female who is a primary care patient of [name], [name], MD. I reached out to [employer] by phone today in response to a referral sent by Theresa Watson's health plan.     A second unsuccessful telephone outreach was attempted today. The patient was referred to the case management team for assistance with care management and care coordination.   Follow Up Plan: A HIPPA compliant phone message was left for the patient providing contact information and requesting a return call.  The care management team will reach out to the patient again over the next 7 days.  If patient returns call to provider office, please advise to call [facility] at [place], [place], [place], [place] [address] Direct Dial: [phone] [email or web address] Website: [email or web address]

## 2019-12-15 NOTE — Chronic Care Management (AMB) (Signed)
  Chronic Care Management   Note  12/15/2019 Name: MIRELLE BISKUP MRN: [id number] DOB: [birth year]  Deniah M Georgiades is a 86 y.o. year old female who is a primary care patient of [name], [name], MD. I reached out to [employer] by phone today in response to a referral sent by Ms. Jannel M Jurewicz's health plan.     Ms. Meloy was given information about Chronic Care Management services today including:  1. CCM service includes personalized support from designated clinical staff supervised by her physician, including individualized plan of care and coordination with other care providers 2. 24/7 contact phone numbers for assistance for urgent and routine care needs. 3. Service will only be billed when office clinical staff spend 20 minutes or more in a month to coordinate care. 4. Only one practitioner may furnish and bill the service in a calendar month. 5. The patient may stop CCM services at any time (effective at the end of the month) by phone call to the office staff. 6. The patient will be responsible for cost sharing (co-pay) of up to 20% of the service fee (after annual deductible is met).  Patient agreed to services and verbal consent obtained.   Follow up plan: Telephone appointment with care management team member scheduled for:03/16/2020  Noreene Larsson, [place], [place], [place] [address] Direct Dial: [phone] [email or web address] Website: [email or web address]

## 2019-12-28 ENCOUNTER — Emergency Department (HOSPITAL_COMMUNITY)
Admission: EM | Admit: 2019-12-28 | Discharge: 2019-12-28 | Disposition: A | Discharge status: Home / Self Care | Payer: Medicare Other | Attending: Emergency Medicine | Admitting: Emergency Medicine

## 2019-12-28 ENCOUNTER — Emergency Department (HOSPITAL_COMMUNITY): Payer: Medicare Other

## 2019-12-28 ENCOUNTER — Other Ambulatory Visit: Payer: Self-pay

## 2019-12-28 ENCOUNTER — Encounter (HOSPITAL_COMMUNITY): Payer: Self-pay | Admitting: Emergency Medicine

## 2019-12-28 DIAGNOSIS — I639 Cerebral infarction, unspecified: Secondary | ICD-10-CM | POA: Insufficient documentation

## 2019-12-28 DIAGNOSIS — Z87891 Personal history of nicotine dependence: Secondary | ICD-10-CM | POA: Insufficient documentation

## 2019-12-28 DIAGNOSIS — R1084 Generalized abdominal pain: Secondary | ICD-10-CM

## 2019-12-28 DIAGNOSIS — E119 Type 2 diabetes mellitus without complications: Secondary | ICD-10-CM | POA: Insufficient documentation

## 2019-12-28 DIAGNOSIS — I10 Essential (primary) hypertension: Secondary | ICD-10-CM | POA: Diagnosis not present

## 2019-12-28 DIAGNOSIS — R109 Unspecified abdominal pain: Secondary | ICD-10-CM | POA: Diagnosis not present

## 2019-12-28 DIAGNOSIS — K6389 Other specified diseases of intestine: Secondary | ICD-10-CM

## 2019-12-28 LAB — URINALYSIS, ROUTINE W REFLEX MICROSCOPIC
Bacteria, UA: NONE SEEN
Bilirubin Urine: NEGATIVE
Glucose, UA: NEGATIVE mg/dL
Hgb urine dipstick: NEGATIVE
Ketones, ur: 5 mg/dL — AB
Nitrite: NEGATIVE
Protein, ur: NEGATIVE mg/dL
Specific Gravity, Urine: 1.016 (ref 1.005–1.030)
pH: 6 (ref 5.0–8.0)

## 2019-12-28 LAB — COMPREHENSIVE METABOLIC PANEL
ALT: 29 U/L (ref 0–44)
AST: 31 U/L (ref 15–41)
Albumin: 4.1 g/dL (ref 3.5–5.0)
Alkaline Phosphatase: 62 U/L (ref 38–126)
Anion gap: 11 (ref 5–15)
BUN: 21 mg/dL (ref 8–23)
CO2: 26 mmol/L (ref 22–32)
Calcium: 9.4 mg/dL (ref 8.9–10.3)
Chloride: 96 mmol/L — ABNORMAL LOW (ref 98–111)
Creatinine, Ser: 0.79 mg/dL (ref 0.44–1.00)
GFR calc Af Amer: >60 mL/min (ref >60)
GFR calc non Af Amer: >60 mL/min (ref >60)
Glucose, Bld: 137 mg/dL — ABNORMAL HIGH (ref 70–99)
Potassium: 3.2 mmol/L — ABNORMAL LOW (ref 3.5–5.1)
Sodium: 133 mmol/L — ABNORMAL LOW (ref 135–145)
Total Bilirubin: 0.7 mg/dL (ref 0.3–1.2)
Total Protein: 7.1 g/dL (ref 6.5–8.1)

## 2019-12-28 LAB — CBC WITH DIFFERENTIAL/PLATELET
Abs Immature Granulocytes: 0.03 10*3/uL (ref 0.00–0.07)
Basophils Absolute: 0 10*3/uL (ref 0.0–0.1)
Basophils Relative: 0 %
Eosinophils Absolute: 0.1 10*3/uL (ref 0.0–0.5)
Eosinophils Relative: 1 %
HCT: 36.2 % (ref 36.0–46.0)
Hemoglobin: 12.4 g/dL (ref 12.0–15.0)
Immature Granulocytes: 0 %
Lymphocytes Relative: 14 %
Lymphs Abs: 1.2 10*3/uL (ref 0.7–4.0)
MCH: 32.3 pg (ref 26.0–34.0)
MCHC: 34.3 g/dL (ref 30.0–36.0)
MCV: 94.3 fL (ref 80.0–100.0)
Monocytes Absolute: 0.4 10*3/uL (ref 0.1–1.0)
Monocytes Relative: 5 %
Neutro Abs: 7.2 10*3/uL (ref 1.7–7.7)
Neutrophils Relative %: 80 %
Platelets: 279 10*3/uL (ref 150–400)
RBC: 3.84 MIL/uL — ABNORMAL LOW (ref 3.87–5.11)
RDW: 12.1 % (ref 11.5–15.5)
WBC: 9 10*3/uL (ref 4.0–10.5)
nRBC: 0 % (ref 0.0–0.2)

## 2019-12-28 LAB — LIPASE, BLOOD: Lipase: 16 U/L (ref 11–51)

## 2019-12-28 MED ORDER — MORPHINE SULFATE (PF) 4 MG/ML IV SOLN
4.0000 mg | Freq: Once | INTRAVENOUS | Status: AC
  Administered 2019-12-28: 4 mg via INTRAVENOUS
  Filled 2019-12-28: qty 1

## 2019-12-28 MED ORDER — ONDANSETRON HCL 4 MG/2ML IJ SOLN
4.0000 mg | Freq: Once | INTRAMUSCULAR | Status: AC
  Administered 2019-12-28: 19:00:00 4 mg via INTRAVENOUS
  Filled 2019-12-28: qty 2

## 2019-12-28 MED ORDER — SODIUM CHLORIDE 0.9 % IV BOLUS
1000.0000 mL | Freq: Once | INTRAVENOUS | Status: AC
  Administered 2019-12-28: 1000 mL via INTRAVENOUS

## 2019-12-28 MED ORDER — SULFAMETHOXAZOLE-TRIMETHOPRIM 800-160 MG PO TABS: 1.0000 | ORAL_TABLET | Freq: Two times a day (BID) | ORAL | 0 refills | Status: AC

## 2019-12-28 MED ORDER — IOHEXOL 350 MG/ML SOLN: 100.0000 mL | Freq: Once | INTRAVENOUS | Status: DC | PRN

## 2019-12-28 MED ORDER — IOHEXOL 300 MG/ML  SOLN
100.0000 mL | Freq: Once | INTRAMUSCULAR | Status: AC | PRN
  Administered 2019-12-28: 100 mL via INTRAVENOUS

## 2019-12-28 MED ORDER — POTASSIUM CHLORIDE 10 MEQ/100ML IV SOLN
10.0000 meq | Freq: Once | INTRAVENOUS | Status: AC
  Administered 2019-12-28: 10 meq via INTRAVENOUS
  Filled 2019-12-28: qty 100

## 2019-12-28 MED ORDER — METRONIDAZOLE 500 MG PO TABS: 500.0000 mg | ORAL_TABLET | Freq: Two times a day (BID) | ORAL | 0 refills | Status: DC

## 2019-12-28 NOTE — ED Provider Notes (Signed)
[facility] EMERGENCY DEPARTMENT Provider Note   CSN: [id number] Arrival date & time: 12/28/19  1457    History Chief Complaint  Patient presents with  . Abdominal Pain    Rakel KYLE LAACK is a 86 y.o. female with past medical history significant for diabetes, diverticulitis, esophagitis, IBS, kidney stone, CVA, hypertension who presents for evaluation abdominal pain.  Patient states she woke up with abdominal pain this morning.  It does not radiate.  Described as aching.  Denies fever, chills, nausea, vomiting, chest pain, shortness of breath, diarrhea, dysuria, constipation, weakness.  Denies additional aggravating or alleviating factors.  She took 1 dose of Tylenol without relief of her symptoms.  History obtained from patient and past medical records.  No interpreter is used.  Collateral from Daughter in room.  States patient had her baseline mentation.  Complaining of abdominal pain that began this morning. Tolerating PO intake in ED.  HPI     Past Medical History:  Diagnosis Date  . Colon polyp   . Diabetes mellitus without complication ([place])   . Diverticulosis   . Esophagitis   . IBS (irritable bowel syndrome)   . Kidney stone    hx of 1 kidney stone  . Lumbosacral spondylosis without myelopathy   . Obesity, mild   . Other and unspecified hyperlipidemia   . Pain in joint, shoulder region   . 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 Stroke ([place])   . Symptomatic menopausal or female climacteric states     Patient Active Problem List   Diagnosis Date Noted  . Acute ischemic stroke ([place]) 03/25/2019  . Acute metabolic encephalopathy [id number]  . Hyponatremia 03/24/2019  . Diabetes mellitus due to underlying condition with diabetic autonomic neuropathy, without long-term current use of insulin ([place]) 09/18/2015  . Annual physical exam 02/26/2015  . Type 2 diabetes mellitus with neurological manifestations, controlled ([place]) 08/29/2013  .  Hypertension 11/13/2012  . Cataracts, bilateral 11/13/2012  . Symptomatic menopausal or female climacteric states   . Hyperlipidemia associated with type 2 diabetes mellitus ([place])   . Esophagitis   . 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 Obesity, mild   . IBS (irritable bowel syndrome)   . Kidney stone   . Lumbosacral spondylosis without myelopathy   . Pain in joint, shoulder region   . Colon polyp   . Diverticulosis     Past Surgical History:  Procedure Laterality Date  . CARPAL TUNNEL RELEASE    . CHOLECYSTECTOMY    . EYE SURGERY Bilateral    cataracts  . lumbar back surgery    . VAGINAL HYSTERECTOMY     partial     OB History    Gravida  5   Para  5   Term  5   Preterm      AB      Living        SAB      TAB      Ectopic      Multiple      Live Births              Family History  Problem Relation Age of Onset  . Cancer Mother        brain  . Brain cancer Mother   . GI Bleed Father   . Arthritis Sister        rheumatoid  . Lymphoma Sister   . Pancreatic cancer Brother   .  Diabetes Son   . Arthritis Son        back  . Cancer Son        prostate  . Cancer Maternal Grandfather   . Suicidality Paternal Grandfather   . Arthritis Son        back  . Hyperlipidemia Son   . Colitis Son     Social History   Tobacco Use  . Smoking status: Former Smoker    Packs/day: 0.50    Types: Cigarettes    Start date: 10/06/1953    Quit date: 02/04/2012    Years since quitting: 7.9  . Smokeless tobacco: Never Used  Substance Use Topics  . Alcohol use: No  . Drug use: No    Home Medications Prior to Admission medications   Medication Sig Start Date End Date Taking? Authorizing Provider  aspirin 81 MG EC tablet Take 81 mg by mouth daily.     Yes [provider]  Calcium Carb-Cholecalciferol (CALCIUM 600 + D PO) Take 1 tablet by mouth daily.    Yes [provider]  Cholecalciferol (VITAMIN D3) 2000  UNITS TABS Take 1 tablet by mouth daily.     Yes [provider]  ferrous sulfate 325 (65 FE) MG EC tablet Take 325 mg by mouth daily with breakfast.     Yes [provider]  gabapentin (NEURONTIN) 300 MG capsule Take 1 capsule (300 mg total) by mouth 2 (two) times daily. 12/07/19  Yes [name], [name], MD  Garlic 123XX123 MG TABS Take 1 tablet by mouth 2 (two) times daily.    Yes [provider]  geriatric multivitamins-minerals (ELDERTONIC/GEVRABON) ELIX Take 15 mLs by mouth daily.     Yes [provider]  Omega-3 Fatty Acids (FISH OIL) 1000 MG CAPS Take 2 capsules by mouth daily.    Yes [provider]  omeprazole (PRILOSEC) 20 MG capsule Take 1 capsule (20 mg total) by mouth daily. 11/30/19  Yes [name], [name], MD  potassium chloride SA (KLOR-CON) 20 MEQ tablet TAKE 1 TABLET BY MOUTH  DAILY 08/08/19  Yes Rakes, Connye Burkitt, FNP  pravastatin (PRAVACHOL) 80 MG tablet Take 1 tablet (80 mg total) by mouth daily. 11/30/19  Yes [name], [name], MD  triamterene-hydrochlorothiazide (MAXZIDE-25) 37.5-25 MG tablet Take 1 tablet by mouth daily. 11/30/19  Yes Claretta Fraise, MD  vitamin C (ASCORBIC ACID) 500 MG tablet Take 500 mg by mouth daily.   Yes [provider]  VITAMIN E PO Take 1 capsule by mouth daily.   Yes [provider]  metroNIDAZOLE (FLAGYL) 500 MG tablet Take 1 tablet (500 mg total) by mouth 2 (two) times daily. 12/28/19   Arly Salminen A, PA-C  sulfamethoxazole-trimethoprim (BACTRIM DS) 800-160 MG tablet Take 1 tablet by mouth 2 (two) times daily for 7 days. 12/28/19 01/04/20  Sim Choquette A, PA-C    Allergies    Levaquin [levofloxacin], Cephalexin, Metformin and related, Penicillins, and Lisinopril  Review of Systems   Review of Systems  Constitutional: Negative.   HENT: Negative.   Respiratory: Negative.   Cardiovascular: Negative.   Gastrointestinal: Positive for abdominal pain. Negative for abdominal distention, anal bleeding,  blood in stool, constipation, diarrhea, nausea, rectal pain and vomiting.  Genitourinary: Negative.   Musculoskeletal: Negative.   Skin: Negative.   Neurological: Negative.   All other systems reviewed and are negative.   Physical Exam Updated Vital Signs BP (!) 113/57   Pulse 83   Temp 98.5 F (36.9 C) (Oral)  Resp 18   Ht 5' (1.524 m)   Wt 63.5 kg   SpO2 94%   BMI 27.34 kg/m   Physical Exam Vitals and nursing note reviewed.  Constitutional:      General: She is not in acute distress.    Appearance: She is well-developed. She is not ill-appearing, toxic-appearing or diaphoretic.  HENT:     Head: Normocephalic and atraumatic.     Mouth/Throat:     Mouth: Mucous membranes are moist.  Eyes:     Pupils: Pupils are equal, round, and reactive to light.  Cardiovascular:     Rate and Rhythm: Normal rate.     Heart sounds: Normal heart sounds.  Pulmonary:     Effort: Pulmonary effort is normal. No respiratory distress.     Breath sounds: Normal breath sounds.  Abdominal:     General: Bowel sounds are normal. There is no distension.     Palpations: Abdomen is soft.     Tenderness: There is generalized abdominal tenderness. There is no right CVA tenderness, left CVA tenderness, guarding or rebound. Negative signs include Murphy's sign and McBurney's sign.     Hernia: No hernia is present.  Musculoskeletal:        General: Normal range of motion.     Cervical back: Normal range of motion.  Skin:    General: Skin is warm and dry.     Capillary Refill: Capillary refill takes less than 2 seconds.     Comments: 3 cm area of erythematous skin under mid abdominal pannus resembles yeast.  No fluctuance or induration.  No evidence of cellulitis  Neurological:     Mental Status: She is alert.     ED Results / Procedures / Treatments   Labs (all labs ordered are listed, but only abnormal results are displayed) Labs Reviewed  CBC WITH DIFFERENTIAL/PLATELET - Abnormal; Notable  for the following components:      Result Value   RBC 3.84 (*)    All other components within normal limits  COMPREHENSIVE METABOLIC PANEL - Abnormal; Notable for the following components:   Sodium 133 (*)    Potassium 3.2 (*)    Chloride 96 (*)    Glucose, Bld 137 (*)    All other components within normal limits  URINALYSIS, ROUTINE W REFLEX MICROSCOPIC - Abnormal; Notable for the following components:   Ketones, ur 5 (*)    Leukocytes,Ua TRACE (*)    All other components within normal limits  URINE CULTURE  LIPASE, BLOOD    EKG None  Radiology CT Abdomen Pelvis W Contrast  Result Date: 12/28/2019 CLINICAL DATA:  Abdominal pain beginning this morning. EXAM: CT ABDOMEN AND PELVIS WITH CONTRAST TECHNIQUE: Multidetector CT imaging of the abdomen and pelvis was performed using the standard protocol following bolus administration of intravenous contrast. CONTRAST:  157mL OMNIPAQUE IOHEXOL 300 MG/ML  SOLN COMPARISON:  03/24/2019 FINDINGS: Lower chest: No acute abnormality. Hepatobiliary: Normal liver. Status post cholecystectomy. Mild intra and extrahepatic bile duct dilation, common bile duct measuring 9 mm proximally. Duct dilation has mildly increased from the prior CT. No CT evidence of a duct stone. Pancreas: Unremarkable. No pancreatic ductal dilatation or surrounding inflammatory changes. Spleen: Normal in size without focal abnormality. Adrenals/Urinary Tract: Adrenal glands are unremarkable. Kidneys are normal, without renal calculi, focal lesion, or hydronephrosis. Bladder is unremarkable. Stomach/Bowel: There is a small amount of pneumatosis intestinalis along the cecum with a small amount of air extending into the directly adjacent portal vein. There  is no other portal venous air and no other evidence of pneumatosis intestinalis. There is no colonic wall thickening or adjacent inflammation. Normal appendix is visualized. Small hiatal hernia. Stomach otherwise unremarkable. Normal small  bowel. Vascular/Lymphatic: Aortic atherosclerosis. No aneurysm. No enlarged lymph nodes. Reproductive: Status post hysterectomy. No adnexal masses. Other: No abdominal wall hernia or abnormality. No abdominopelvic ascites. Musculoskeletal: No fracture or acute finding. No osteoblastic or osteolytic lesions. IMPRESSION: 1. Small amount of pneumatosis intestinalis involving the cecum and directly adjacent portal vein, without bowel wall thickening or adjacent inflammation. Suspect this is benign pneumatosis intestinalis. 2. Intra and extrahepatic bile duct dilation that has mildly increased when compared to the prior CT. This is still presumed chronic in this post cholecystectomy patient, but if there are findings suggesting biliary obstruction, follow-up ERCP or MRCP would be recommended. 3. No other evidence of an acute abnormality within the abdomen or pelvis. 4. Small hiatal hernia. 5. Aortic atherosclerosis. Electronically Signed   By: Lajean Manes M.D.   On: 12/28/2019 20:57    Procedures Procedures (including critical care time)  Medications Ordered in ED Medications  sodium chloride 0.9 % bolus 1,000 mL (0 mLs Intravenous Stopped 12/28/19 2003)  ondansetron (ZOFRAN) injection 4 mg (4 mg Intravenous Given 12/28/19 1845)  morphine 4 MG/ML injection 4 mg (4 mg Intravenous Given 12/28/19 1846)  potassium chloride 10 mEq in 100 mL IVPB (0 mEq Intravenous Stopped 12/28/19 2034)  iohexol (OMNIPAQUE) 300 MG/ML solution 100 mL (100 mLs Intravenous Contrast Given 12/28/19 2028)    ED Course  I have reviewed the triage vital signs and the nursing notes.  Pertinent labs & imaging results that were available during my care of the patient were reviewed by me and considered in my medical decision making (see chart for details).  86 year old female appears otherwise well presents for evaluation of abdominal pain.  He is afebrile, nonseptic, not ill-appearing.  Generalized abdominal pain.  She denies any chest  pain, shortness of breath.  Her vital signs are stable without any tachycardia, tachypnea or hypoxia.  She has 2+ radial pulses bilaterally with tactile temperature.  Plan on labs, imaging and reassess  Labs and imaging personally reviewed and interpreted: CBC without leukocytosis Metabolic panel with mild hyponatremia to 133, hypokalemia at 3.2, mildly elevated glucose at 137 Lipase 16 EKG without STEMI CT AP with mild pneumatosis intestinalis UA negative for infection  2000: Patient reassessed.  Daughter is now in room.  States patient at baseline mentation.  Patient states pain has significantly improved.  She is pending CT scan  2158: CT AP with mild, benign pneumatosis intestinalis.  I have consulted with general surgery, Dr. Arnoldo Morale.  States typically they admit people to the hospitalist for IV antibiotics for this however patient does not want to be admitted at this time.  He feels it is reasonable since she is tolerating p.o. intake and has not required additional pain medicines to trial p.o. antibiotics at home with close follow-up.  I discussed this with patient and daughter in room.  They still do not want admission.  Will DC home with antibiotics I discussed strict return precautions with patient and family.  They are agreeable for follow-up or return for any new worsening symptoms.  CONSULT with pharmacist, Jeneen Rinks at [facility].  Patient has multiple anaphylactic medication reactions.  He recommends Bactrim and Flagyl.  Discussed plan with patient and family.  They are agreeable with plan given they do not want admission.  Discussed return precautions.  The patient has been appropriately medically screened and/or stabilized in the ED. I have low suspicion for any other emergent medical condition which would require further screening, evaluation or treatment in the ED or require inpatient management.  Patient is hemodynamically stable and in no acute distress.  Patient able to ambulate  in department prior to ED.  Evaluation does not show acute pathology that would require ongoing or additional emergent interventions while in the emergency department or further inpatient treatment.  I have discussed the diagnosis with the patient and answered all questions.  Pain is been managed while in the emergency department and patient has no further complaints prior to discharge.  Patient is comfortable with plan discussed in room and is stable for discharge at this time.  I have discussed strict return precautions for returning to the emergency department.  Patient was encouraged to follow-up with PCP/specialist refer to at discharge.   Patient seen eval by attending, Dr. Roderic Palau who agrees with the treatment, plan and disposition.    MDM Rules/Calculators/A&P                       Final Clinical Impression(s) / ED Diagnoses Final diagnoses:  Pneumatosis intestinalis  Generalized abdominal pain    Rx / DC Orders ED Discharge Orders         Ordered    metroNIDAZOLE (FLAGYL) 500 MG tablet  2 times daily     12/28/19 2216    sulfamethoxazole-trimethoprim (BACTRIM DS) 800-160 MG tablet  2 times daily     12/28/19 2216           Chanler Schreiter A, PA-C 12/28/19 2218    Milton Ferguson, MD 01/02/20 [phone]

## 2019-12-28 NOTE — ED Triage Notes (Signed)
Pt c/o abdominal pain that began this morning. Denies n/v/d.

## 2019-12-28 NOTE — Discharge Instructions (Addendum)
Return for new or worsening symptoms. Take the antibiotics as prescribed.

## 2019-12-29 ENCOUNTER — Telehealth: Payer: Self-pay | Admitting: Family Medicine

## 2019-12-29 NOTE — Telephone Encounter (Signed)
Needs to schedule hospital follow up with PCP.

## 2019-12-29 NOTE — Telephone Encounter (Signed)
Appointment scheduled.

## 2019-12-30 ENCOUNTER — Other Ambulatory Visit: Payer: Self-pay

## 2019-12-30 ENCOUNTER — Encounter: Payer: Self-pay | Admitting: Family Medicine

## 2019-12-30 ENCOUNTER — Ambulatory Visit (INDEPENDENT_AMBULATORY_CARE_PROVIDER_SITE_OTHER): Payer: Medicare Other | Admitting: Family Medicine

## 2019-12-30 VITALS — BP 123/73 | HR 86 | Temp 97.8°F | Ht 60.0 in | Wt 145.8 lb

## 2019-12-30 DIAGNOSIS — K5792 Diverticulitis of intestine, part unspecified, without perforation or abscess without bleeding: Secondary | ICD-10-CM | POA: Diagnosis not present

## 2019-12-30 LAB — URINE CULTURE: Culture: NO GROWTH

## 2019-12-30 NOTE — Progress Notes (Signed)
Subjective:  Patient ID: Theresa Watson, female    DOB: 1933/06/15  Age: 86 y.o. MRN: [id number]  CC: Hospitalization Follow-up   HPI Lynzy LARINDA HERTER presents for hospitalization due to diverticulitis.  She was discharged home on oral antibiotics.  She is having some nausea.   Depression screen [facility] 2/9 12/30/2019 11/30/2019 05/11/2019  Decreased Interest 0 0 0  Down, Depressed, Hopeless 0 0 0  PHQ - 2 Score 0 0 0  Altered sleeping - - -  Tired, decreased energy - - -  Change in appetite - - -  Feeling bad or failure about yourself  - - -  Trouble concentrating - - -  Moving slowly or fidgety/restless - - -  Suicidal thoughts - - -  PHQ-9 Score - - -  Difficult doing work/chores - - -    History Sahory has a past medical history of Colon polyp, Diabetes mellitus without complication ([place]), Diverticulosis, Esophagitis, IBS (irritable bowel syndrome), Kidney stone, Lumbosacral spondylosis without myelopathy, Obesity, mild, Other and unspecified hyperlipidemia, Pain in joint, shoulder region, Prolapse of vaginal walls without mention of uterine prolapse, Stroke ([place]), and Symptomatic menopausal or female climacteric states.   She has a past surgical history that includes Carpal tunnel release; Cholecystectomy; Vaginal hysterectomy; lumbar back surgery; and Eye surgery (Bilateral).   Her family history includes Arthritis in her sister, son, and son; Brain cancer in her mother; Cancer in her maternal grandfather, mother, and son; Colitis in her son; Diabetes in her son; GI Bleed in her father; Hyperlipidemia in her son; Lymphoma in her sister; Pancreatic cancer in her brother; Suicidality in her paternal grandfather.She reports that she quit smoking about 7 years ago. Her smoking use included cigarettes. She started smoking about 66 years ago. She smoked 0.50 packs per day. She has never used smokeless tobacco. She reports that she does not drink alcohol or use drugs.    ROS Review of  Systems  Constitutional: Negative.   HENT: Negative.   Eyes: Negative for visual disturbance.  Respiratory: Negative for shortness of breath.   Cardiovascular: Negative for chest pain.  Gastrointestinal: Negative for abdominal pain.  Musculoskeletal: Negative for arthralgias.    Objective:  BP 123/73   Pulse 86   Temp 97.8 F (36.6 C) (Temporal)   Ht 5' (1.524 m)   Wt 145 lb 12.8 oz (66.1 kg)   SpO2 97%   BMI 28.47 kg/m   BP Readings from Last 3 Encounters:  12/30/19 123/73  12/28/19 (!) 113/57  11/30/19 136/68    Wt Readings from Last 3 Encounters:  12/30/19 145 lb 12.8 oz (66.1 kg)  12/28/19 140 lb (63.5 kg)  11/30/19 147 lb (66.7 kg)     Physical Exam Constitutional:      General: She is not in acute distress.    Appearance: She is well-developed.  HENT:     Head: Normocephalic and atraumatic.  Eyes:     Conjunctiva/sclera: Conjunctivae normal.     Pupils: Pupils are equal, round, and reactive to light.  Neck:     Thyroid: No thyromegaly.  Cardiovascular:     Rate and Rhythm: Normal rate and regular rhythm.     Heart sounds: Normal heart sounds. No murmur.  Pulmonary:     Effort: Pulmonary effort is normal. No respiratory distress.     Breath sounds: Normal breath sounds. No wheezing or rales.  Abdominal:     General: Bowel sounds are normal. There is no distension.  Palpations: Abdomen is soft.     Tenderness: There is no abdominal tenderness.  Musculoskeletal:        General: Normal range of motion.     Cervical back: Normal range of motion and neck supple.  Lymphadenopathy:     Cervical: No cervical adenopathy.  Skin:    General: Skin is warm and dry.  Neurological:     Mental Status: She is alert and oriented to person, place, and time.  Psychiatric:        Behavior: Behavior normal.        Thought Content: Thought content normal.        Judgment: Judgment normal.       Assessment & Plan:   Lavra was seen today for  hospitalization follow-up.  Diagnoses and all orders for this visit:  Diverticulitis -     CBC with Differential/Platelet -     CMP14+EGFR  Patient is doing well with her postop treatment.  I ordered basic labs to follow her condition.  We will base any changes in treatment on her symptoms plus the results of the blood work.     I am having Jakeline M. Vondrak maintain her aspirin, Vitamin D3, geriatric multivitamins-minerals, Fish Oil, ferrous sulfate, Garlic, Calcium Carb-Cholecalciferol (CALCIUM 600 + D PO), vitamin C, VITAMIN E PO, potassium chloride SA, omeprazole, triamterene-hydrochlorothiazide, pravastatin, gabapentin, metroNIDAZOLE, and sulfamethoxazole-trimethoprim.  Allergies as of 12/30/2019      Reactions   Levaquin [levofloxacin] Swelling   tongue   Cephalexin    Metformin And Related Diarrhea   Penicillins    .Did it involve swelling of the face/tongue/throat, SOB, or low BP? Unknown Did it involve sudden or severe rash/hives, skin peeling, or any reaction on the inside of your mouth or nose? Unknown Did you need to seek medical attention at a hospital or doctor's office? Unknown When did it last happen? If all above answers are "NO", may proceed with cephalosporin use.   Lisinopril Cough      Medication List       Accurate as of December 30, 2019 11:59 PM. If you have any questions, ask your nurse or doctor.        aspirin 81 MG EC tablet Take 81 mg by mouth daily.   CALCIUM 600 + D PO Take 1 tablet by mouth daily.   ferrous sulfate 325 (65 FE) MG EC tablet Take 325 mg by mouth daily with breakfast.   Fish Oil 1000 MG Caps Take 2 capsules by mouth daily.   gabapentin 300 MG capsule Commonly known as: NEURONTIN Take 1 capsule (300 mg total) by mouth 2 (two) times daily.   Garlic 413 MG Tabs Take 1 tablet by mouth 2 (two) times daily.   geriatric multivitamins-minerals Elix Take 15 mLs by mouth daily.   metroNIDAZOLE 500 MG tablet Commonly  known as: FLAGYL Take 1 tablet (500 mg total) by mouth 2 (two) times daily.   omeprazole 20 MG capsule Commonly known as: PRILOSEC Take 1 capsule (20 mg total) by mouth daily.   potassium chloride SA 20 MEQ tablet Commonly known as: KLOR-CON TAKE 1 TABLET BY MOUTH  DAILY   pravastatin 80 MG tablet Commonly known as: PRAVACHOL Take 1 tablet (80 mg total) by mouth daily.   sulfamethoxazole-trimethoprim 800-160 MG tablet Commonly known as: BACTRIM DS Take 1 tablet by mouth 2 (two) times daily for 7 days.   triamterene-hydrochlorothiazide 37.5-25 MG tablet Commonly known as: MAXZIDE-25 Take 1 tablet by mouth daily.  vitamin C 500 MG tablet Commonly known as: ASCORBIC ACID Take 500 mg by mouth daily.   Vitamin D3 50 MCG (2000 UT) Tabs Take 1 tablet by mouth daily.   VITAMIN E PO Take 1 capsule by mouth daily.        Follow-up: No follow-ups on file.  Claretta Fraise, M.D.

## 2019-12-31 LAB — CBC WITH DIFFERENTIAL/PLATELET
Basophils Absolute: 0 10*3/uL (ref 0.0–0.2)
Basos: 0 %
EOS (ABSOLUTE): 0.2 10*3/uL (ref 0.0–0.4)
Eos: 2 %
Hematocrit: 33.4 % — ABNORMAL LOW (ref 34.0–46.6)
Hemoglobin: 11.5 g/dL (ref 11.1–15.9)
Immature Grans (Abs): 0 10*3/uL (ref 0.0–0.1)
Immature Granulocytes: 0 %
Lymphocytes Absolute: 2.2 10*3/uL (ref 0.7–3.1)
Lymphs: 27 %
MCH: 32 pg (ref 26.6–33.0)
MCHC: 34.4 g/dL (ref 31.5–35.7)
MCV: 93 fL (ref 79–97)
Monocytes Absolute: 0.7 10*3/uL (ref 0.1–0.9)
Monocytes: 9 %
Neutrophils Absolute: 5.2 10*3/uL (ref 1.4–7.0)
Neutrophils: 62 %
Platelets: 265 10*3/uL (ref 150–450)
RBC: 3.59 x10E6/uL — ABNORMAL LOW (ref 3.77–5.28)
RDW: 11.7 % (ref 11.7–15.4)
WBC: 8.4 10*3/uL (ref 3.4–10.8)

## 2019-12-31 LAB — CMP14+EGFR
ALT: 23 IU/L (ref 0–32)
AST: 24 IU/L (ref 0–40)
Albumin/Globulin Ratio: 2.3 — ABNORMAL HIGH (ref 1.2–2.2)
Albumin: 4.4 g/dL (ref 3.6–4.6)
Alkaline Phosphatase: 79 IU/L (ref 39–117)
BUN/Creatinine Ratio: 12 (ref 12–28)
BUN: 12 mg/dL (ref 8–27)
Bilirubin Total: 0.4 mg/dL (ref 0.0–1.2)
CO2: 24 mmol/L (ref 20–29)
Calcium: 9.8 mg/dL (ref 8.7–10.3)
Chloride: 102 mmol/L (ref 96–106)
Creatinine, Ser: 1.04 mg/dL — ABNORMAL HIGH (ref 0.57–1.00)
GFR calc Af Amer: 57 mL/min/{1.73_m2} — ABNORMAL LOW (ref >59)
GFR calc non Af Amer: 49 mL/min/{1.73_m2} — ABNORMAL LOW (ref >59)
Globulin, Total: 1.9 g/dL (ref 1.5–4.5)
Glucose: 138 mg/dL — ABNORMAL HIGH (ref 65–99)
Potassium: 3.7 mmol/L (ref 3.5–5.2)
Sodium: 141 mmol/L (ref 134–144)
Total Protein: 6.3 g/dL (ref 6.0–8.5)

## 2020-01-01 ENCOUNTER — Encounter: Payer: Self-pay | Admitting: Family Medicine

## 2020-01-01 NOTE — Progress Notes (Signed)
Hello Taniya,  Your lab result is normal and/or stable.Some minor variations that are not significant are commonly marked abnormal, but do not represent any medical problem for you.  Best regards, Sadi Arave, M.D.

## 2020-02-07 ENCOUNTER — Other Ambulatory Visit: Payer: Self-pay | Admitting: Family Medicine

## 2020-02-23 DIAGNOSIS — H2513 Age-related nuclear cataract, bilateral: Secondary | ICD-10-CM | POA: Diagnosis not present

## 2020-02-23 DIAGNOSIS — H40033 Anatomical narrow angle, bilateral: Secondary | ICD-10-CM | POA: Diagnosis not present

## 2020-03-16 ENCOUNTER — Ambulatory Visit: Payer: Medicare Other | Admitting: *Deleted

## 2020-03-16 DIAGNOSIS — I10 Essential (primary) hypertension: Secondary | ICD-10-CM

## 2020-03-16 DIAGNOSIS — E1149 Type 2 diabetes mellitus with other diabetic neurological complication: Secondary | ICD-10-CM

## 2020-03-16 NOTE — Chronic Care Management (AMB) (Signed)
  Chronic Care Management   Initial Visit Outreach Note  03/16/2020 Name: Theresa Watson MRN: [id number] DOB: [birth year]  Referred by: Claretta Fraise, MD Reason for referral : Chronic Care Management (Initial visit)   An unsuccessful Initial Telephone Visit was attempted today. The patient was referred to the case management team for assistance with care management and care coordination.   Follow Up Plan: The care management team will reach out to the patient again over the next 10 days.   Chong Sicilian, BSN, RN-BC Embedded Chronic Care Manager [facility] Direct Dial: [phone]

## 2020-03-19 ENCOUNTER — Telehealth: Payer: Self-pay | Admitting: Family Medicine

## 2020-03-19 NOTE — Chronic Care Management (AMB) (Signed)
  Care Management   Note  03/19/2020 Name: Theresa Watson MRN: [id number] DOB: 1933/09/18  [name] is a 86 y.o. year old female who is a primary care patient of [name], [name], MD and is actively engaged with the care management team. I reached out to [employer] by phone today to assist with re-scheduling an initial visit with the Licensed Clinical Social Worker for intake appointment for RN CM.   Follow up plan: Telephone appointment with care management team member scheduled for: 04/05/2020  [place], [place], [place] [address] Direct Dial: [email or web address] Website: [email or web address]

## 2020-04-02 ENCOUNTER — Telehealth: Payer: Self-pay | Admitting: Family Medicine

## 2020-04-02 NOTE — Chronic Care Management (AMB) (Signed)
  Chronic Care Management   Note  04/02/2020 Name: Theresa Watson MRN: [id number] DOB: 10-26-1932  [name] is a 86 y.o. year old female who is a primary care patient of [name], [name], MD and is actively engaged with the care management team. I reached out to [employer] by phone today to assist with re-scheduling an initial visit with the Licensed Clinical Social Worker for intake appointment for RN CM.  Follow up plan: Telephone appointment with care management team member scheduled for: 11:00 AM on 04/05/2020.  [place], [place] [address] Direct Dial: [phone] [email or web address]: [email or web address]

## 2020-04-05 ENCOUNTER — Ambulatory Visit (INDEPENDENT_AMBULATORY_CARE_PROVIDER_SITE_OTHER): Payer: Medicare Other | Admitting: Licensed Clinical Social Worker

## 2020-04-05 DIAGNOSIS — E1169 Type 2 diabetes mellitus with other specified complication: Secondary | ICD-10-CM

## 2020-04-05 DIAGNOSIS — I639 Cerebral infarction, unspecified: Secondary | ICD-10-CM | POA: Diagnosis not present

## 2020-04-05 DIAGNOSIS — E1149 Type 2 diabetes mellitus with other diabetic neurological complication: Secondary | ICD-10-CM | POA: Diagnosis not present

## 2020-04-05 DIAGNOSIS — E785 Hyperlipidemia, unspecified: Secondary | ICD-10-CM | POA: Diagnosis not present

## 2020-04-05 DIAGNOSIS — I10 Essential (primary) hypertension: Secondary | ICD-10-CM

## 2020-04-05 NOTE — Chronic Care Management (AMB) (Signed)
Chronic Care Management    Clinical Social Work Follow Up Note  04/05/2020 Name: Theresa Watson MRN: [id number] DOB: 1933-08-19  Theresa Watson is a 86 y.o. year old female who is a primary care patient of [name], [name], MD. The CCM team was consulted for assistance with [name] .   Review of patient status, including review of consultants reports, other relevant assessments, and collaboration with appropriate care team members and the patient's provider was performed as part of comprehensive patient evaluation and provision of chronic care management services.    SDOH (Social Determinants of Health) assessments performed: Yes; risk for stress;risk for depression; risk for tobacco use;     Chronic Care Management from 04/05/2020 in [place]  PHQ-9 Total Score 2      GAD 7 : Generalized Anxiety Score 04/05/2020 04/01/2019  Nervous, Anxious, on Edge 0 3  Control/stop worrying 0 3  Worry too much - different things 0 2  Trouble relaxing 0 3  Restless 0 3  Easily annoyed or irritable 1 1  Afraid - awful might happen 0 1  Total GAD 7 Score 1 16  Anxiety Difficulty Somewhat difficult -    SDOH Interventions     Most Recent Value  SDOH Interventions  Depression Interventions/Treatment  --  [LCSW talked with daughter of client about RNCM support for client and LCSW support for client]       Outpatient Encounter Medications as of 04/05/2020  Medication Sig  . aspirin 81 MG EC tablet Take 81 mg by mouth daily.    . Calcium Carb-Cholecalciferol (CALCIUM 600 + D PO) Take 1 tablet by mouth daily.   . Cholecalciferol (VITAMIN D3) 2000 UNITS TABS Take 1 tablet by mouth daily.    . ferrous sulfate 325 (65 FE) MG EC tablet Take 325 mg by mouth daily with breakfast.    . gabapentin (NEURONTIN) 300 MG capsule TAKE 1 CAPSULE BY MOUTH  TWICE DAILY  . Garlic 619 MG TABS Take 1 tablet by mouth 2 (two) times daily.   Marland Kitchen geriatric multivitamins-minerals  (ELDERTONIC/GEVRABON) ELIX Take 15 mLs by mouth daily.    . metroNIDAZOLE (FLAGYL) 500 MG tablet Take 1 tablet (500 mg total) by mouth 2 (two) times daily.  . Omega-3 Fatty Acids (FISH OIL) 1000 MG CAPS Take 2 capsules by mouth daily.   Marland Kitchen omeprazole (PRILOSEC) 20 MG capsule Take 1 capsule (20 mg total) by mouth daily.  . potassium chloride SA (KLOR-CON) 20 MEQ tablet TAKE 1 TABLET BY MOUTH  DAILY  . pravastatin (PRAVACHOL) 80 MG tablet Take 1 tablet (80 mg total) by mouth daily.  Marland Kitchen triamterene-hydrochlorothiazide (MAXZIDE-25) 37.5-25 MG tablet Take 1 tablet by mouth daily.  . vitamin C (ASCORBIC ACID) 500 MG tablet Take 500 mg by mouth daily.  Marland Kitchen VITAMIN E PO Take 1 capsule by mouth daily.   No facility-administered encounter medications on file as of 04/05/2020.    Goals Addressed              This Visit's Progress   .  Client will talk with LCSW in next 30 days about community resources of assistance for client (pt-stated)        CARE PLAN ENTRY   Current Barriers:  Mobility issues (uses a cane to walk outdoors) of patient with chronic diagnoses of Hx CVA, Type 2 DM, HTN, IBS, HLD   Clinical Social Work Clinical Goal(s):  Marland Kitchen LCSW will call client in next 30 days to  talk with client aobut community resources of help to client  Interventions:  . Talked with Theresa Watson, daughter of patient , about CCM program support . Talked with Theresa Watson about client appetite . Talked with Theresa Watson about in home care needs of client . Talked with Theresa Watson about [name] . Talked with Theresa Watson about transport needs of client (client's  son, Theresa Watson, helps to transport client to and from client appointments) . Talked with Theresa Watson about upcoming client appointments . Talked with Theresa Watson about relaxation techniques of client (likes to go outdoors to sit and likes to do [name], watches TV) . Talked with Theresa Watson about social support network of client (client has support from  her daughters  and from her son) . Encouraged Theresa Watson or client to call RNCM as needed to discuss the nursing needs of client  Patient Self Care Activities:  Attends scheduled medical appointments  Patient Self Care Deficits:   Needs some occasional help with ADLs  Initial goal documentation       Follow Up Plan: LCSW will call client in next 4 weeks to talk with client or her daughter about community resources of help to client  Norva Riffle.Zara Wendt MSW, LCSW Licensed Clinical Social Worker [facility] Care Management [phone]

## 2020-04-05 NOTE — Patient Instructions (Addendum)
Licensed Clinical Social Worker Visit Information  Goals we discussed today:  Goals Addressed              This Visit's Progress   .  Client will talk with LCSW in next 30 days about community resources of assistance for client (pt-stated)        CARE PLAN ENTRY   Current Barriers:  Mobility issues (uses a cane to walk outdoors) of patient with chronic diagnoses of Hx CVA, Type 2 DM, HTN, IBS, HLD  Clinical Social Work Clinical Goal(s):  Marland Kitchen LCSW will call client in next 30 days to talk with client aobut community resources of help to client  Interventions:  . Talked with Theresa Watson, daughter of patient , about CCM program support . Talked with Theresa Watson about client appetite . Talked with Theresa Watson about in home care needs of client . Talked with Theresa Watson about [name] . Talked with Theresa Watson about transport needs of client (client's  son, Theresa Watson, helps to transport client to and from client appointments) . Talked with Theresa Watson about upcoming client appointments . Talked with Theresa Watson about relaxation techniques of client (likes to go outdoors to sit and likes to do [name], watches TV) . Talked with Theresa Watson about social support network of client (client has support from her daughters  and from her son) . Encouraged Theresa or client to call RNCM as needed to discuss the nursing needs of client  Patient Self Care Activities:  Attends scheduled medical appointments  Patient Self Care Deficits:   Needs some occasional help with ADLs  Initial goal documentation      Materials Provided: No  Follow Up Plan: LCSW will call client in next 4 weeks to talk with client or her daughter about community resources of help to client  The patient/Theresa Watson, daughter,verbalized understanding of instructions provided today and declined a print copy of patient instruction materials.   Norva Riffle.Theresa Watson MSW, LCSW Licensed Clinical Social Worker [facility] Care Management [phone]

## 2020-04-11 ENCOUNTER — Ambulatory Visit (INDEPENDENT_AMBULATORY_CARE_PROVIDER_SITE_OTHER): Payer: Medicare Other | Admitting: *Deleted

## 2020-04-11 DIAGNOSIS — Z0000 Encounter for general adult medical examination without abnormal findings: Secondary | ICD-10-CM | POA: Diagnosis not present

## 2020-04-11 NOTE — Progress Notes (Signed)
Marland Kitchen     MEDICARE ANNUAL WELLNESS VISIT  04/11/2020  Telephone Visit Disclaimer This Medicare AWV was conducted by telephone due to national recommendations for restrictions regarding the COVID-19 Pandemic (e.g. social distancing).  I verified, using two identifiers, that I am speaking with Theresa Watson or their authorized healthcare agent. I discussed the limitations, risks, security, and privacy concerns of performing an evaluation and management service by telephone and the potential availability of an in-person appointment in the future. The patient expressed understanding and agreed to proceed.   Subjective:  Theresa Watson is a 86 y.o. female patient of [name], [name], MD who had a Medicare Annual Wellness Visit today via telephone. Theresa Watson is Retired and lives alone. she has 5 children. she reports that she is not socially active and does interact with friends/family regularly. she is not physically active and enjoys swinging in her swing and seeing her family.  Patient Care Team: Claretta Fraise, MD as PCP - General (Family Medicine) Clarene Essex, MD as Consulting Physician (Gastroenterology) Allyn Kenner, MD (Dermatology) Harlen Labs, MD as Referring Physician (Optometry) Cori Razor, Delice Bison, RN as Registered Nurse Shea Evans, Norva Riffle, LCSW as [facility] (Licensed Clinical Social Worker)  Advanced Directives 04/11/2020 12/28/2019 04/05/2019 04/04/2019 03/31/2019 03/24/2019 03/24/2019  Does Patient Have a Medical Advance Directive? Yes No No No Yes Yes Yes  Type of Advance Directive [place];Living will [place];Living will  Does patient want to make changes to medical advance directive? No - Patient declined - No - Patient declined No - Patient declined - No - Patient declined -  Copy of [place] in Chart? No - copy requested - No - copy requested - - No - copy  requested No - copy requested  Would patient like information on creating a medical advance directive? - No - Patient declined No - Patient declined No - Patient declined - No - Patient declined No - Patient declined    Hospital Utilization Over the Past 12 Months: # of hospitalizations or ER visits: 1 # of surgeries: 0  Review of Systems    Patient reports that her overall health is unchanged compared to last year.  History obtained from chart review and the patient  Patient Reported Readings (BP, Pulse, CBG, Weight, etc) CBG 131  Pain Assessment Pain : No/denies pain     Current Medications & Allergies (verified) Allergies as of 04/11/2020      Reactions   Levaquin [levofloxacin] Swelling   tongue   Cephalexin    Metformin And Related Diarrhea   Penicillins    .Did it involve swelling of the face/tongue/throat, SOB, or low BP? Unknown Did it involve sudden or severe rash/hives, skin peeling, or any reaction on the inside of your mouth or nose? Unknown Did you need to seek medical attention at a hospital or doctor's office? Unknown When did it last happen? If all above answers are "NO", may proceed with cephalosporin use.   Lisinopril Cough      Medication List       Accurate as of April 11, 2020  9:59 AM. If you have any questions, ask your nurse or doctor.        STOP taking these medications   metroNIDAZOLE 500 MG tablet Commonly known as: FLAGYL     TAKE these medications   aspirin 81 MG EC tablet Take 81 mg by mouth  daily.   CALCIUM 600 + D PO Take 1 tablet by mouth daily.   ferrous sulfate 325 (65 FE) MG EC tablet Take 325 mg by mouth daily with breakfast.   Fish Oil 1000 MG Caps Take 2 capsules by mouth daily.   gabapentin 300 MG capsule Commonly known as: NEURONTIN TAKE 1 CAPSULE BY MOUTH  TWICE DAILY   Garlic 628 MG Tabs Take 1 tablet by mouth 2 (two) times daily.   geriatric multivitamins-minerals Elix Take 15 mLs by mouth daily.     omeprazole 20 MG capsule Commonly known as: PRILOSEC Take 1 capsule (20 mg total) by mouth daily.   potassium chloride SA 20 MEQ tablet Commonly known as: KLOR-CON TAKE 1 TABLET BY MOUTH  DAILY   pravastatin 80 MG tablet Commonly known as: PRAVACHOL Take 1 tablet (80 mg total) by mouth daily.   triamterene-hydrochlorothiazide 37.5-25 MG tablet Commonly known as: MAXZIDE-25 Take 1 tablet by mouth daily.   vitamin C 500 MG tablet Commonly known as: ASCORBIC ACID Take 500 mg by mouth daily.   Vitamin D3 50 MCG (2000 UT) Tabs Take 1 tablet by mouth daily.   VITAMIN E PO Take 1 capsule by mouth daily.       History (reviewed): Past Medical History:  Diagnosis Date  . Colon polyp   . Diabetes mellitus without complication ([place])   . Diverticulosis   . Esophagitis   . IBS (irritable bowel syndrome)   . Kidney stone    hx of 1 kidney stone  . Lumbosacral spondylosis without myelopathy   . Obesity, mild   . Other and unspecified hyperlipidemia   . Pain in joint, shoulder region   . 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 Stroke ([place])   . Symptomatic menopausal or female climacteric states    Past Surgical History:  Procedure Laterality Date  . CARPAL TUNNEL RELEASE    . CHOLECYSTECTOMY    . EYE SURGERY Bilateral    cataracts  . lumbar back surgery    . VAGINAL HYSTERECTOMY     partial   Family History  Problem Relation Age of Onset  . Cancer Mother        brain  . Brain cancer Mother   . GI Bleed Father   . Arthritis Sister        rheumatoid  . Lymphoma Sister   . Pancreatic cancer Brother   . Diabetes Son   . Arthritis Son        back  . Cancer Son        prostate  . Cancer Maternal Grandfather   . Suicidality Paternal Grandfather   . Arthritis Son        back  . Hyperlipidemia Son   . Colitis Son    Social History   Socioeconomic History  . Marital status: Divorced    Spouse name: Not on file  . Number of  children: 5  . Years of education: GED  . Highest education level: GED or equivalent  Occupational History  . Occupation: retired  Tobacco Use  . Smoking status: Former Smoker    Packs/day: 0.50    Types: Cigarettes    Start date: 10/06/1953    Quit date: 02/04/2012    Years since quitting: 8.1  . Smokeless tobacco: Never Used  Vaping Use  . Vaping Use: Never used  Substance and Sexual Activity  . Alcohol use: No  . Drug use: No  .  Sexual activity: Not Currently  Other Topics Concern  . Not on file  Social History Narrative  . Not on file   Social Determinants of Health   Financial Resource Strain:   . Difficulty of Paying Living Expenses:   Food Insecurity:   . Worried About Charity fundraiser in the Last Year:   . Arboriculturist in the Last Year:   Transportation Needs:   . Film/video editor (Medical):   Marland Kitchen Lack of Transportation (Non-Medical):   Physical Activity:   . Days of Exercise per Week:   . Minutes of Exercise per Session:   Stress:   . Feeling of Stress :   Social Connections:   . Frequency of Communication with Friends and Family:   . Frequency of Social Gatherings with Friends and Family:   . Attends Religious Services:   . Active Member of Clubs or Organizations:   . Attends Archivist Meetings:   Marland Kitchen Marital Status:     Activities of Daily Living In your present state of health, do you have any difficulty performing the following activities: 04/11/2020  Hearing? N  Vision? Y  Comment glasses  Difficulty concentrating or making decisions? N  Walking or climbing stairs? N  Dressing or bathing? Y  Comment assistance only for safety for showers  Doing errands, shopping? Y  Comment does not Physiological scientist and eating ? N  Using the Toilet? N  In the past six months, have you accidently leaked urine? N  Do you have problems with loss of bowel control? N  Managing your Medications? Y  Comment daughter fixes  Managing your  Finances? Y  Comment daughter pays  Housekeeping or managing your Housekeeping? N  Comment except laundry which is downstairs  Some recent data might be hidden    Patient Education/ Literacy How often do you need to have someone help you when you read instructions, pamphlets, or other written materials from your doctor or pharmacy?: 1 - Never What is the last grade level you completed in school?: GED  Exercise Current Exercise Habits: The patient does not participate in regular exercise at present, Exercise limited by: None identified  Diet Patient reports consuming 3 meals a day and 1 snack(s) a day Patient reports that her primary diet is: Regular Patient reports that she does have regular access to food.   Depression Screen PHQ 2/9 Scores 04/11/2020 04/05/2020 12/30/2019 11/30/2019 05/11/2019 04/05/2019 04/04/2019  PHQ - 2 Score 0 0 0 0 0 0 5  PHQ- 9 Score - 2 - - - - 8     Fall Risk Fall Risk  04/11/2020 12/30/2019 11/30/2019 04/04/2019 10/14/2018  Falls in the past year? 0 0 0 0 0  Comment - - - - -  Number falls in past yr: - - 0 - -  Injury with Fall? - - 0 - -  Comment - - - - -  Risk for fall due to : - - No Fall Risks - -  Follow up - Falls prevention discussed Falls evaluation completed - -     Objective:  Moet Toney Watson seemed alert and oriented and she participated appropriately during our telephone visit.  Blood Pressure Weight BMI  BP Readings from Last 3 Encounters:  12/30/19 123/73  12/28/19 (!) 113/57  11/30/19 136/68   Wt Readings from Last 3 Encounters:  12/30/19 145 lb 12.8 oz (66.1 kg)  12/28/19 140 lb (63.5 kg)  11/30/19 147  lb (66.7 kg)   BMI Readings from Last 1 Encounters:  12/30/19 28.47 kg/m    *Unable to obtain current vital signs, weight, and BMI due to telephone visit type  Hearing/Vision  . Marilena did not seem to have difficulty with hearing/understanding during the telephone conversation . Reports that she has not had a formal eye exam by an  eye care professional within the past year . Reports that she has not had a formal hearing evaluation within the past year *Unable to fully assess hearing and vision during telephone visit type  Cognitive Function: 6CIT Screen 04/11/2020 04/04/2019  What Year? 0 points 0 points  What month? 0 points 0 points  What time? 0 points 0 points  Count back from 20 0 points 0 points  Months in reverse 2 points 0 points  Repeat phrase 2 points 2 points  Total Score 4 2   (Normal:0-7, Significant for Dysfunction: >8)  Normal Cognitive Function Screening: Yes   Immunization & Health Maintenance Record Immunization History  Administered Date(s) Administered  . Fluad Quad(high Dose 65+) 07/06/2019  . Influenza Split 06/24/2012  . Influenza Whole 07/06/2008  . Influenza, High Dose Seasonal PF 07/02/2016, 07/07/2017, 07/05/2018  . Influenza,inj,Quad PF,6+ Mos 07/06/2013, 07/05/2014, 07/05/2015  . Moderna SARS-COVID-2 Vaccination 10/29/2019, 11/26/2019  . Pneumococcal Conjugate-13 08/29/2013  . Pneumococcal Polysaccharide-23 03/06/2004  . Td 03/06/2004, 07/08/2011  . Tdap 07/06/2009, 07/08/2011  . Zoster 08/07/2008    Health Maintenance  Topic Date Due  . OPHTHALMOLOGY EXAM  12/12/2018  . MAMMOGRAM  07/06/2019  . INFLUENZA VACCINE  05/06/2020  . HEMOGLOBIN A1C  05/29/2020  . FOOT EXAM  11/29/2020  . URINE MICROALBUMIN  11/29/2020  . TETANUS/TDAP  07/07/2021  . DEXA SCAN  Completed  . COVID-19 Vaccine  Completed  . PNA vac Low Risk Adult  Completed       Assessment  This is a routine wellness examination for [employer].  Health Maintenance: Due or Overdue Health Maintenance Due  Topic Date Due  . OPHTHALMOLOGY EXAM  12/12/2018  . MAMMOGRAM  07/06/2019    Kalley Toney Watson does not need a referral for Community Assistance: Care Management:   no Social Work:    no Prescription Assistance:  no Nutrition/Diabetes Education:  no   Plan:  Personalized Goals Goals  Addressed              This Visit's Progress   .  DIET - DECREASE SODA OR JUICE INTAKE (pt-stated)   Not on track     Pt states she only drinks 1 bottle of water a day, has coffee in the morning and a couple of diet sodas throughout the day    .  DIET - INCREASE WATER INTAKE      .  Exercise 150 min/wk Moderate Activity   Not on track   .  Prevent falls        Personalized Health Maintenance & Screening Recommendations  Screening mammography diabetic eye exam  Lung Cancer Screening Recommended: no (Low Dose CT Chest recommended if Age 48-80 years, 30 pack-year currently smoking OR have quit w/in past 15 years) Hepatitis C Screening recommended: no HIV Screening recommended: no  Advanced Directives: Written information was not prepared per patient's request.  Referrals & Orders No orders of the defined types were placed in this encounter.   Follow-up Plan . Follow-up with Claretta Fraise, MD as planned on 05/29/20 . Pt needs to schedule a diabetic eye exam, she is aware .  Pt is due for mammogram, but says she is done and doesn't want to have anymore mammograms . Patients had no difficultly hearing over telephone. . Pt does have a power of attorney, I requested this for our records. . Pt still lives alone and is mostly independent. She needs help with medication,, showers and paying bills. . Pt voices no healthcare concerns and has no questions for this nurse at the time. . AVS printed and mailed to pt.    I have personally reviewed and noted the following in the patient's chart:   . Medical and social history . Use of alcohol, tobacco or illicit drugs  . Current medications and supplements . Functional ability and status . Nutritional status . Physical activity . Advanced directives . List of other physicians . Hospitalizations, surgeries, and ER visits in previous 12 months . Vitals . Screenings to include cognitive, depression, and falls . Referrals and  appointments  In addition, I have reviewed and discussed with Hiilei Toney Watson certain preventive protocols, quality metrics, and best practice recommendations. A written personalized care plan for preventive services as well as general preventive health recommendations is available and can be mailed to the patient at her request.      Rana Snare, LPN  01/09/9976

## 2020-05-07 ENCOUNTER — Other Ambulatory Visit: Payer: Self-pay | Admitting: Family Medicine

## 2020-05-09 ENCOUNTER — Telehealth: Payer: Medicare Other

## 2020-05-11 ENCOUNTER — Telehealth: Payer: Self-pay | Admitting: *Deleted

## 2020-05-11 NOTE — Chronic Care Management (AMB) (Signed)
  Chronic Care Management   Note  05/11/2020 Name: Theresa Watson MRN: [id number] DOB: 1933-01-18  Theresa Watson is a 86 y.o. year old female who is a primary care patient of [name], [name], MD and is actively engaged with the care management team. I reached out to [employer] by phone today to assist with re-scheduling a follow up visit with the Licensed Clinical Education officer, museum.  Follow up plan: Telephone appointment with care management team member scheduled for:06/18/2020  [place], [place] [facility]  [place], [place] [address] Direct Dial: [email or web address] Website: [email or web address]

## 2020-05-29 ENCOUNTER — Encounter: Payer: Self-pay | Admitting: Family Medicine

## 2020-05-29 ENCOUNTER — Other Ambulatory Visit: Payer: Self-pay

## 2020-05-29 ENCOUNTER — Ambulatory Visit (INDEPENDENT_AMBULATORY_CARE_PROVIDER_SITE_OTHER): Payer: Medicare Other | Admitting: Family Medicine

## 2020-05-29 VITALS — BP 109/60 | HR 64 | Temp 97.6°F | Ht 60.0 in | Wt 145.4 lb

## 2020-05-29 DIAGNOSIS — I10 Essential (primary) hypertension: Secondary | ICD-10-CM

## 2020-05-29 DIAGNOSIS — E785 Hyperlipidemia, unspecified: Secondary | ICD-10-CM

## 2020-05-29 DIAGNOSIS — K580 Irritable bowel syndrome with diarrhea: Secondary | ICD-10-CM | POA: Diagnosis not present

## 2020-05-29 DIAGNOSIS — E1149 Type 2 diabetes mellitus with other diabetic neurological complication: Secondary | ICD-10-CM

## 2020-05-29 DIAGNOSIS — E1169 Type 2 diabetes mellitus with other specified complication: Secondary | ICD-10-CM | POA: Diagnosis not present

## 2020-05-29 LAB — BAYER DCA HB A1C WAIVED: HB A1C (BAYER DCA - WAIVED): 7.1 % — ABNORMAL HIGH (ref <7.0)

## 2020-05-29 MED ORDER — SITAGLIPTIN PHOSPHATE 100 MG PO TABS: 100.0000 mg | ORAL_TABLET | Freq: Every day | ORAL | 2 refills | Status: DC

## 2020-05-29 MED ORDER — SIMETHICONE 125 MG PO CAPS: 125.0000 mg | ORAL_CAPSULE | Freq: Three times a day (TID) | ORAL | 2 refills | Status: DC

## 2020-05-29 NOTE — Progress Notes (Signed)
Subjective:  Patient ID: Theresa Watson, female    DOB: 09-10-1933  Age: 86 y.o. MRN: [id number]  CC: Follow-up (6 month)   HPI Theresa Watson presents forFollow-up of diabetes.  She is accompanied by her daughter in law Elmo.  Patient checks blood sugar at home.  130-150 fasting and 1 40-200  postprandial. See log sheets Patient denies symptoms such as polyuria, polydipsia, excessive hunger, nausea No significant hypoglycemic spells noted. Medications reviewed. Pt reports taking them regularly without complication/adverse reaction being reported today.  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   She has a past surgical history that includes Carpal tunnel release; Cholecystectomy; Vaginal hysterectomy; lumbar back surgery; and Eye surgery (Bilateral).   Her family history includes Arthritis in her sister, son, and son; Brain cancer in her mother; Cancer in her maternal grandfather, mother, and son; Colitis in her son; Diabetes in her son; GI Bleed in her father; Hyperlipidemia in her son; Lymphoma in her sister; Pancreatic cancer in her brother; Suicidality in her paternal grandfather.She reports that she quit smoking about 8 years ago. Her smoking use included cigarettes. She started smoking about 66 years ago. She smoked 0.50 packs per day. She has never used smokeless tobacco. She reports that she does not drink alcohol and does not use drugs.  Current Outpatient Medications on File Prior to Visit  Medication Sig Dispense Refill   aspirin 81 MG EC tablet Take 81 mg by mouth daily.        Calcium Carb-Cholecalciferol (CALCIUM 600 + D PO) Take 1 tablet by mouth daily.      Cholecalciferol (VITAMIN D3) 2000 UNITS TABS Take 1 tablet by mouth daily.       ferrous sulfate 325 (65 FE) MG EC tablet Take 325 mg by mouth daily with breakfast.       Garlic 673 MG TABS Take 1 tablet by mouth 2 (two) times daily.      geriatric multivitamins-minerals (ELDERTONIC/GEVRABON) ELIX Take 15 mLs by mouth daily.       Omega-3 Fatty Acids (FISH OIL) 1000 MG CAPS Take 2 capsules by mouth daily.      potassium chloride SA (KLOR-CON) 20 MEQ tablet TAKE 1 TABLET BY MOUTH  DAILY 90 tablet 3   pravastatin (PRAVACHOL) 80 MG tablet Take 1 tablet (80 mg total) by mouth daily. 90 tablet 3   triamterene-hydrochlorothiazide (MAXZIDE-25) 37.5-25 MG tablet Take 1 tablet by mouth daily. 90 tablet 3   vitamin C (ASCORBIC ACID) 500 MG tablet Take 500 mg by mouth daily.     VITAMIN E PO Take 1 capsule by mouth daily.     No current facility-administered medications on file prior to visit.    ROS Review of Systems  Musculoskeletal: Positive for arthralgias (In the arches and in the ankles of both feet).    Objective:  BP 109/60    Pulse 64    Temp 97.6 F (36.4 C) (Temporal)    Ht 5' (1.524 m)    Wt 145 lb 6.4 oz (66 kg)    BMI 28.40 kg/m   BP Readings from Last 3 Encounters:  05/29/20 109/60  12/30/19 123/73  12/28/19 (!) 113/57  Wt Readings from Last 3 Encounters:  05/29/20 145 lb 6.4 oz (66 kg)  12/30/19 145 lb 12.8 oz (66.1 kg)  12/28/19 140 lb (63.5 kg)     Physical Exam Constitutional:      General: She is not in acute distress.    Appearance: She is well-developed.  HENT:     Head: Normocephalic and atraumatic.  Eyes:     Conjunctiva/sclera: Conjunctivae normal.     Pupils: Pupils are equal, round, and reactive to light.  Neck:     Thyroid: No thyromegaly.  Cardiovascular:     Rate and Rhythm: Normal rate and regular rhythm.     Heart sounds: Normal heart sounds.  No murmur heard.   Pulmonary:     Effort: Pulmonary effort is normal. No respiratory distress.     Breath sounds: Normal breath sounds. No wheezing or rales.  Abdominal:     General: Bowel sounds are normal. There is no distension.     Palpations: Abdomen is soft.     Tenderness: There is no abdominal tenderness.  Musculoskeletal:        General: Normal range of motion.     Cervical back: Normal range of motion and neck supple.  Lymphadenopathy:     Cervical: No cervical adenopathy.  Skin:    General: Skin is warm and dry.  Neurological:     Mental Status: She is alert and oriented to person, place, and time.  Psychiatric:        Behavior: Behavior normal.        Thought Content: Thought content normal.        Judgment: Judgment normal.     Results for orders placed or performed in visit on 05/29/20  Microalbumin / creatinine urine ratio  Result Value Ref Range   Creatinine, Urine 44.7 Not Estab. mg/dL   Microalbumin, Urine 12.4 Not Estab. ug/mL   Microalb/Creat Ratio 28 0 - 29 mg/g creat  Bayer DCA Hb A1c Waived  Result Value Ref Range   HB A1C (BAYER DCA - WAIVED) 7.1 (H) <7.0 %  Lipid panel  Result Value Ref Range   Cholesterol, Total 153 100 - 199 mg/dL   Triglycerides 180 (H) 0 - 149 mg/dL   HDL 41 >39 mg/dL   VLDL Cholesterol Cal 31 5 - 40 mg/dL   LDL Chol Calc (NIH) 81 0 - 99 mg/dL   Chol/HDL Ratio 3.7 0.0 - 4.4 ratio  CMP14+EGFR  Result Value Ref Range   Glucose 115 (H) 65 - 99 mg/dL   BUN 16 8 - 27 mg/dL   Creatinine, Ser 0.91 0.57 - 1.00 mg/dL   GFR calc non Af Amer 58 (L) >59 mL/min/1.73   GFR calc Af Amer 67 >59 mL/min/1.73   BUN/Creatinine Ratio 18 12 - 28   Sodium 133 (L) 134 - 144 mmol/L   Potassium 4.1 3.5 - 5.2 mmol/L   Chloride 94 (L) 96 - 106 mmol/L   CO2 27 20 - 29 mmol/L   Calcium 10.2 8.7 - 10.3 mg/dL   Total Protein 6.3 6.0 - 8.5 g/dL   Albumin 4.2 3.6 - 4.6 g/dL   Globulin, Total 2.1 1.5 - 4.5 g/dL   Albumin/Globulin Ratio 2.0 1.2 -  2.2   Bilirubin Total 0.5 0.0 - 1.2 mg/dL   Alkaline Phosphatase 70 48 - 121 IU/L   AST 20 0 - 40 IU/L   ALT 20 0 - 32 IU/L  CBC with Differential/Platelet  Result Value Ref Range  WBC 6.9 3.4 - 10.8 x10E3/uL   RBC 3.68 (L) 3.77 - 5.28 x10E6/uL   Hemoglobin 11.9 11.1 - 15.9 g/dL   Hematocrit 35.3 34.0 - 46.6 %   MCV 96 79 - 97 fL   MCH 32.3 26.6 - 33.0 pg   MCHC 33.7 31 - 35 g/dL   RDW 11.4 (L) 11.7 - 15.4 %   Platelets 289 150 - 450 x10E3/uL   Neutrophils 48 Not Estab. %   Lymphs 41 Not Estab. %   Monocytes 7 Not Estab. %   Eos 3 Not Estab. %   Basos 1 Not Estab. %   Neutrophils Absolute 3.4 1 - 7 x10E3/uL   Lymphocytes Absolute 2.8 0 - 3 x10E3/uL   Monocytes Absolute 0.5 0 - 0 x10E3/uL   EOS (ABSOLUTE) 0.2 0.0 - 0.4 x10E3/uL   Basophils Absolute 0.0 0 - 0 x10E3/uL   Immature Granulocytes 0 Not Estab. %   Immature Grans (Abs) 0.0 0.0 - 0.1 x10E3/uL  Specimen status report  Result Value Ref Range   specimen status report Comment      Assessment & Plan:   Theresa Watson was seen today for follow-up.  Diagnoses and all orders for this visit:  Type 2 diabetes mellitus with neurological manifestations, controlled ([place]) -     sitaGLIPtin (JANUVIA) 100 MG tablet; Take 1 tablet (100 mg total) by mouth daily. -     Microalbumin / creatinine urine ratio -     Bayer DCA Hb A1c Waived -     Lipid panel -     CMP14+EGFR -     CBC with Differential/Platelet -     gabapentin (NEURONTIN) 300 MG capsule; Take 1 capsule (300 mg total) by mouth 2 (two) times daily.  Hyperlipidemia associated with type 2 diabetes mellitus (HCC) -     Lipid panel -     CMP14+EGFR  Essential hypertension -     CMP14+EGFR  Irritable bowel syndrome with diarrhea -     CMP14+EGFR -     CBC with Differential/Platelet -     Simethicone (GAS-X EXTRA STRENGTH) 125 MG CAPS; Take 1 capsule (125 mg total) by mouth 3 (three) times daily before meals. -     Specimen status report -     omeprazole (PRILOSEC) 20  MG capsule; Take 1 capsule (20 mg total) by mouth daily.      I have changed Theresa Watson's gabapentin. I am also having her start on sitaGLIPtin and Simethicone. Additionally, I am having her maintain her aspirin, Vitamin D3, geriatric multivitamins-minerals, Fish Oil, ferrous sulfate, Garlic, Calcium Carb-Cholecalciferol (CALCIUM 600 + D PO), vitamin C, VITAMIN E PO, potassium chloride SA, triamterene-hydrochlorothiazide, pravastatin, and omeprazole.  Meds ordered this encounter  Medications   sitaGLIPtin (JANUVIA) 100 MG tablet    Sig: Take 1 tablet (100 mg total) by mouth daily.    Dispense:  30 tablet    Refill:  2   Simethicone (GAS-X EXTRA STRENGTH) 125 MG CAPS    Sig: Take 1 capsule (125 mg total) by mouth 3 (three) times daily before meals.    Dispense:  90 capsule    Refill:  2   gabapentin (NEURONTIN) 300 MG capsule    Sig: Take 1 capsule (300 mg total) by mouth 2 (two) times daily.    Dispense:  180 capsule    Refill:  0   omeprazole (PRILOSEC) 20 MG capsule    Sig: Take 1 capsule (20 mg total) by mouth daily.  Dispense:  90 capsule    Refill:  1     Follow-up: Return in about 3 months (around 08/29/2020).  Claretta Fraise, M.D.

## 2020-05-29 NOTE — Progress Notes (Signed)
Hello Theresa Watson,  Your lab result is normal and/or stable.Some minor variations that are not significant are commonly marked abnormal, but do not represent any medical problem for you.  Best regards, Ma Munoz, M.D.

## 2020-05-30 LAB — SPECIMEN STATUS REPORT

## 2020-05-30 LAB — MICROALBUMIN / CREATININE URINE RATIO
Creatinine, Urine: 44.7 mg/dL
Microalb/Creat Ratio: 28 mg/g creat (ref 0–29)
Microalbumin, Urine: 12.4 ug/mL

## 2020-05-30 NOTE — Progress Notes (Signed)
Hello Theresa Watson,  Your lab result is normal and/or stable.Some minor variations that are not significant are commonly marked abnormal, but do not represent any medical problem for you.  Best regards, Kateryn Marasigan, M.D.

## 2020-06-01 LAB — CMP14+EGFR
ALT: 20 IU/L (ref 0–32)
AST: 20 IU/L (ref 0–40)
Albumin/Globulin Ratio: 2 (ref 1.2–2.2)
Albumin: 4.2 g/dL (ref 3.6–4.6)
Alkaline Phosphatase: 70 IU/L (ref 48–121)
BUN/Creatinine Ratio: 18 (ref 12–28)
BUN: 16 mg/dL (ref 8–27)
Bilirubin Total: 0.5 mg/dL (ref 0.0–1.2)
CO2: 27 mmol/L (ref 20–29)
Calcium: 10.2 mg/dL (ref 8.7–10.3)
Chloride: 94 mmol/L — ABNORMAL LOW (ref 96–106)
Creatinine, Ser: 0.91 mg/dL (ref 0.57–1.00)
GFR calc Af Amer: 67 mL/min/{1.73_m2} (ref >59)
GFR calc non Af Amer: 58 mL/min/{1.73_m2} — ABNORMAL LOW (ref >59)
Globulin, Total: 2.1 g/dL (ref 1.5–4.5)
Glucose: 115 mg/dL — ABNORMAL HIGH (ref 65–99)
Potassium: 4.1 mmol/L (ref 3.5–5.2)
Sodium: 133 mmol/L — ABNORMAL LOW (ref 134–144)
Total Protein: 6.3 g/dL (ref 6.0–8.5)

## 2020-06-01 LAB — CBC WITH DIFFERENTIAL/PLATELET
Basophils Absolute: 0 10*3/uL (ref 0.0–0.2)
Basos: 1 %
EOS (ABSOLUTE): 0.2 10*3/uL (ref 0.0–0.4)
Eos: 3 %
Hematocrit: 35.3 % (ref 34.0–46.6)
Hemoglobin: 11.9 g/dL (ref 11.1–15.9)
Immature Grans (Abs): 0 10*3/uL (ref 0.0–0.1)
Immature Granulocytes: 0 %
Lymphocytes Absolute: 2.8 10*3/uL (ref 0.7–3.1)
Lymphs: 41 %
MCH: 32.3 pg (ref 26.6–33.0)
MCHC: 33.7 g/dL (ref 31.5–35.7)
MCV: 96 fL (ref 79–97)
Monocytes Absolute: 0.5 10*3/uL (ref 0.1–0.9)
Monocytes: 7 %
Neutrophils Absolute: 3.4 10*3/uL (ref 1.4–7.0)
Neutrophils: 48 %
Platelets: 289 10*3/uL (ref 150–450)
RBC: 3.68 x10E6/uL — ABNORMAL LOW (ref 3.77–5.28)
RDW: 11.4 % — ABNORMAL LOW (ref 11.7–15.4)
WBC: 6.9 10*3/uL (ref 3.4–10.8)

## 2020-06-01 LAB — LIPID PANEL
Chol/HDL Ratio: 3.7 ratio (ref 0.0–4.4)
Cholesterol, Total: 153 mg/dL (ref 100–199)
HDL: 41 mg/dL (ref >39)
LDL Chol Calc (NIH): 81 mg/dL (ref 0–99)
Triglycerides: 180 mg/dL — ABNORMAL HIGH (ref 0–149)
VLDL Cholesterol Cal: 31 mg/dL (ref 5–40)

## 2020-06-03 ENCOUNTER — Encounter: Payer: Self-pay | Admitting: Family Medicine

## 2020-06-03 MED ORDER — OMEPRAZOLE 20 MG PO CPDR: 20.0000 mg | DELAYED_RELEASE_CAPSULE | Freq: Every day | ORAL | 1 refills | Status: DC

## 2020-06-03 MED ORDER — GABAPENTIN 300 MG PO CAPS: 300.0000 mg | ORAL_CAPSULE | Freq: Two times a day (BID) | ORAL | 0 refills | Status: DC

## 2020-06-15 ENCOUNTER — Telehealth: Payer: Medicare Other

## 2020-06-18 ENCOUNTER — Ambulatory Visit (INDEPENDENT_AMBULATORY_CARE_PROVIDER_SITE_OTHER): Payer: Medicare Other | Admitting: Licensed Clinical Social Worker

## 2020-06-18 DIAGNOSIS — E1149 Type 2 diabetes mellitus with other diabetic neurological complication: Secondary | ICD-10-CM | POA: Diagnosis not present

## 2020-06-18 DIAGNOSIS — E785 Hyperlipidemia, unspecified: Secondary | ICD-10-CM | POA: Diagnosis not present

## 2020-06-18 DIAGNOSIS — I10 Essential (primary) hypertension: Secondary | ICD-10-CM | POA: Diagnosis not present

## 2020-06-18 DIAGNOSIS — I639 Cerebral infarction, unspecified: Secondary | ICD-10-CM

## 2020-06-18 DIAGNOSIS — K580 Irritable bowel syndrome with diarrhea: Secondary | ICD-10-CM

## 2020-06-18 DIAGNOSIS — E1169 Type 2 diabetes mellitus with other specified complication: Secondary | ICD-10-CM | POA: Diagnosis not present

## 2020-06-18 NOTE — Patient Instructions (Addendum)
Licensed Clinical Social Worker Visit Information  Goals we discussed today:  .  Client will talk with LCSW in next 30 days about community resources of assistance for client (pt-stated)        CARE PLAN ENTRY   Current Barriers:  Mobility issues (uses a cane to walk outdoors) of patient with chronic diagnoses of Hx CVA, Type 2 DM, HTN, IBS, HLD   Clinical Social Work Clinical Goal(s):   LCSW will call client in next 30 days to talk with client aobut community resources of help to client  Interventions:   Talked with Theresa Watson, daughter of patient , about CCM program support  Talked with [name] about client appetite  Talked with [name] about in home care needs of client  Talked with [name] about [name]  Talked with [name] about transport needs of client (client's  son, [name], helps to transport client to and from client appointments)  Talked with [name] about upcoming client appointments  Talked with [name] about relaxation techniques of client (likes to go outdoors to sit and likes to do [name], watches TV)  Talked with [name] about social support network of client (client has support from her daughters  and from her son)  Encouraged [name] or client to call RNCM as needed to discuss the nursing needs of client  Talked with [name] about vision of client  Talked with [name] about mobility of client (client uses a cane to help her walk)  Patient Self Care Activities:  Attends scheduled medical appointments  Patient Self Care Deficits:   Needs some occasional help with ADLs  Initial goal documentation    Follow Up Plan:LCSW will call client in next 4 weeks to talk with client or her daughter about community resources of help to client  Materials Provided: No  The patient Theresa Watson, daughter of patient,  verbalized understanding of instructions provided today and declined a print copy of patient  instruction materials.   Norva Riffle.[name] MSW, LCSW Licensed Clinical Social Worker [facility] Care Management [phone]

## 2020-06-18 NOTE — Chronic Care Management (AMB) (Signed)
Chronic Care Management    Clinical Social Work Follow Up Note  06/18/2020 Name: Theresa Watson MRN: [id number] DOB: 06/02/1933  Theresa Watson is a 86 y.o. year old female who is a primary care patient of [name], [name], MD. The CCM team was consulted for assistance with [name] .   Review of patient status, including review of consultants reports, other relevant assessments, and collaboration with appropriate care team members and the patient's provider was performed as part of comprehensive patient evaluation and provision of chronic care management services.    SDOH (Social Determinants of Health) assessments performed: No;risk for tobacco use; risk for depression; risk for stress; risk for physical inactivity     Chronic Care Management from 04/05/2020 in [place]  PHQ-9 Total Score 2      GAD 7 : Generalized Anxiety Score 04/05/2020 04/01/2019  Nervous, Anxious, on Edge 0 3  Control/stop worrying 0 3  Worry too much - different things 0 2  Trouble relaxing 0 3  Restless 0 3  Easily annoyed or irritable 1 1  Afraid - awful might happen 0 1  Total GAD 7 Score 1 16  Anxiety Difficulty Somewhat difficult -    Outpatient Encounter Medications as of 06/18/2020  Medication Sig  . aspirin 81 MG EC tablet Take 81 mg by mouth daily.    . Calcium Carb-Cholecalciferol (CALCIUM 600 + D PO) Take 1 tablet by mouth daily.   . Cholecalciferol (VITAMIN D3) 2000 UNITS TABS Take 1 tablet by mouth daily.    . ferrous sulfate 325 (65 FE) MG EC tablet Take 325 mg by mouth daily with breakfast.    . gabapentin (NEURONTIN) 300 MG capsule Take 1 capsule (300 mg total) by mouth 2 (two) times daily.  . Garlic 517 MG TABS Take 1 tablet by mouth 2 (two) times daily.   Marland Kitchen geriatric multivitamins-minerals (ELDERTONIC/GEVRABON) ELIX Take 15 mLs by mouth daily.    . Omega-3 Fatty Acids (FISH OIL) 1000 MG CAPS Take 2 capsules by mouth daily.   Marland Kitchen omeprazole (PRILOSEC) 20 MG  capsule Take 1 capsule (20 mg total) by mouth daily.  . potassium chloride SA (KLOR-CON) 20 MEQ tablet TAKE 1 TABLET BY MOUTH  DAILY  . pravastatin (PRAVACHOL) 80 MG tablet Take 1 tablet (80 mg total) by mouth daily.  . Simethicone (GAS-X EXTRA STRENGTH) 125 MG CAPS Take 1 capsule (125 mg total) by mouth 3 (three) times daily before meals.  . sitaGLIPtin (JANUVIA) 100 MG tablet Take 1 tablet (100 mg total) by mouth daily.  Marland Kitchen triamterene-hydrochlorothiazide (MAXZIDE-25) 37.5-25 MG tablet Take 1 tablet by mouth daily.  . vitamin C (ASCORBIC ACID) 500 MG tablet Take 500 mg by mouth daily.  Marland Kitchen VITAMIN E PO Take 1 capsule by mouth daily.   No facility-administered encounter medications on file as of 06/18/2020.    Goals    .  Client will talk with LCSW in next 30 days about community resources of assistance for client (pt-stated)      CARE PLAN ENTRY   Current Barriers:  Mobility issues (uses a cane to walk outdoors) of patient with chronic diagnoses of Hx CVA, Type 2 DM, HTN, IBS, HLD   Clinical Social Work Clinical Goal(s):  Marland Kitchen LCSW will call client in next 30 days to talk with client aobut community resources of help to client  Interventions:  . Talked with Theresa Watson, daughter of patient , about CCM program support . Talked with [name]  about client appetite . Talked with Theresa Watson about in home care needs of client . Talked with Theresa Watson about [name] . Talked with Theresa Watson about transport needs of client (client's  son, Theresa Watson, helps to transport client to and from client appointments) . Talked with [facility] about upcoming client appointments . Talked with Theresa Watson about relaxation techniques of client (likes to go outdoors to sit and likes to do [name], watches TV) . Talked with Theresa Watson about social support network of client (client has support from her daughters  and from her son) . Encouraged Theresa Watson or client to call RNCM as needed to discuss the nursing  needs of client . Talked with [name] about vision of client . Talked with Theresa Watson about mobility of client (client uses a cane to help her walk)  Patient Self Care Activities:  Attends scheduled medical appointments  Patient Self Care Deficits:   Needs some occasional help with ADLs  Initial goal documentation    Follow Up Plan: LCSW will call client in next 4 weeks to talk with client or her daughter about community resources of help to client  Norva Riffle.Mariely Mahr MSW, LCSW Licensed Clinical Social Worker [name] [phone]

## 2020-07-20 ENCOUNTER — Ambulatory Visit (INDEPENDENT_AMBULATORY_CARE_PROVIDER_SITE_OTHER): Payer: Medicare Other | Admitting: Licensed Clinical Social Worker

## 2020-07-20 DIAGNOSIS — K580 Irritable bowel syndrome with diarrhea: Secondary | ICD-10-CM

## 2020-07-20 DIAGNOSIS — E1169 Type 2 diabetes mellitus with other specified complication: Secondary | ICD-10-CM

## 2020-07-20 DIAGNOSIS — I10 Essential (primary) hypertension: Secondary | ICD-10-CM | POA: Diagnosis not present

## 2020-07-20 DIAGNOSIS — E1149 Type 2 diabetes mellitus with other diabetic neurological complication: Secondary | ICD-10-CM | POA: Diagnosis not present

## 2020-07-20 DIAGNOSIS — I639 Cerebral infarction, unspecified: Secondary | ICD-10-CM

## 2020-07-20 DIAGNOSIS — E785 Hyperlipidemia, unspecified: Secondary | ICD-10-CM | POA: Diagnosis not present

## 2020-07-20 NOTE — Chronic Care Management (AMB) (Signed)
Chronic Care Management    Clinical Social Work Follow Up Note  07/20/2020 Name: NHYIRA LEANO MRN: [id number] DOB: 04/13/1933  Kassadi M Wilbon is a 86 y.o. year old female who is a primary care patient of [name], [name], MD. The CCM team was consulted for assistance with [name] .   Review of patient status, including review of consultants reports, other relevant assessments, and collaboration with appropriate care team members and the patient's provider was performed as part of comprehensive patient evaluation and provision of chronic care management services.    SDOH (Social Determinants of Health) assessments performed: No; risk for tobacco use;risk for depression; risk for stress; risk for physical inactivity     Chronic Care Management from 04/05/2020 in [place]  PHQ-9 Total Score 2     GAD 7 : Generalized Anxiety Score 04/05/2020 04/01/2019  Nervous, Anxious, on Edge 0 3  Control/stop worrying 0 3  Worry too much - different things 0 2  Trouble relaxing 0 3  Restless 0 3  Easily annoyed or irritable 1 1  Afraid - awful might happen 0 1  Total GAD 7 Score 1 16  Anxiety Difficulty Somewhat difficult -    Outpatient Encounter Medications as of 07/20/2020  Medication Sig  . aspirin 81 MG EC tablet Take 81 mg by mouth daily.    . Calcium Carb-Cholecalciferol (CALCIUM 600 + D PO) Take 1 tablet by mouth daily.   . Cholecalciferol (VITAMIN D3) 2000 UNITS TABS Take 1 tablet by mouth daily.    . ferrous sulfate 325 (65 FE) MG EC tablet Take 325 mg by mouth daily with breakfast.    . gabapentin (NEURONTIN) 300 MG capsule Take 1 capsule (300 mg total) by mouth 2 (two) times daily.  . Garlic 350 MG TABS Take 1 tablet by mouth 2 (two) times daily.   Marland Kitchen geriatric multivitamins-minerals (ELDERTONIC/GEVRABON) ELIX Take 15 mLs by mouth daily.    . Omega-3 Fatty Acids (FISH OIL) 1000 MG CAPS Take 2 capsules by mouth daily.   Marland Kitchen omeprazole (PRILOSEC) 20 MG  capsule Take 1 capsule (20 mg total) by mouth daily.  . potassium chloride SA (KLOR-CON) 20 MEQ tablet TAKE 1 TABLET BY MOUTH  DAILY  . pravastatin (PRAVACHOL) 80 MG tablet Take 1 tablet (80 mg total) by mouth daily.  . Simethicone (GAS-X EXTRA STRENGTH) 125 MG CAPS Take 1 capsule (125 mg total) by mouth 3 (three) times daily before meals.  . sitaGLIPtin (JANUVIA) 100 MG tablet Take 1 tablet (100 mg total) by mouth daily.  Marland Kitchen triamterene-hydrochlorothiazide (MAXZIDE-25) 37.5-25 MG tablet Take 1 tablet by mouth daily.  . vitamin C (ASCORBIC ACID) 500 MG tablet Take 500 mg by mouth daily.  Marland Kitchen VITAMIN E PO Take 1 capsule by mouth daily.   No facility-administered encounter medications on file as of 07/20/2020.    Goals    .  Client will talk with LCSW in next 30 days about community resources of assistance for client (pt-stated)      CARE PLAN ENTRY   Current Barriers:  Mobility issues (uses a cane to walk outdoors) of patient with chronic diagnoses of Hx CVA, Type 2 DM, HTN, IBS, HLD   Clinical Social Work Clinical Goal(s):  Marland Kitchen LCSW will call client in next 30 days to talk with client aobut community resources of help to client  Interventions:  . Talked with [name], daughter of patient , about CCM program support . Talked with [place]  about client appetite . Talked with [name] about in home care needs of client . Talked with [name] about [name] . Talked with [name] about transport needs of client (client's  son, [name], helps to transport client to and from client appointments) . Talked with [name] about upcoming client appointments . Talked with [name] about relaxation techniques of client (likes to go outdoors to sit and likes to do [name], watches TV) . Talked with [name] about social support network of client (client has support from her daughters  and from her son)  Encouraged Teacher, early years/pre or client to call RNCM as needed for nursing support  for client  Talked with [name] about vision of client Patient Self Care Activities:  Attends scheduled medical appointments  Patient Self Care Deficits:   Needs some occasional help with ADLs  Initial goal documentation    Follow Up Plan: LCSW will call client /daughter of client in next 4 weeks to talk with client or her daughter about community resources of help to client  Norva Riffle.Donevan Biller MSW, LCSW Licensed Clinical Social Worker [facility] Care Management [phone]

## 2020-07-20 NOTE — Patient Instructions (Addendum)
Licensed Clinical Social Worker Visit Information  Goals we discussed today:   .  Client will talk with LCSW in next 30 days about community resources of assistance for client (pt-stated)        CARE PLAN ENTRY   Current Barriers:  Mobility issues (uses a cane to walk outdoors) of patient with chronic diagnoses of Hx CVA, Type 2 DM, HTN, IBS, HLD   Clinical Social Work Clinical Goal(s):   LCSW will call client in next 30 days to talk with client aobut community resources of help to client  Interventions:   Talked with [name], daughter of patient , about CCM program support  Talked with [name] about client appetite  Talked with [name] about in home care needs of client  Talked with [name] about [name]  Talked with [name] about transport needs of client (client's  son, [name], helps to transport client to and from client appointments)  Talked with [name] about upcoming client appointments  Talked with [name] about relaxation techniques of client (likes to go outdoors to sit and likes to do [name], watches TV)  Talked with [name] about social support network of client (client has support from her daughters  and from her son)  Encouraged Teacher, early years/pre or client to call RNCM as needed for nursing support for client Talked with [name] about vision of client  Patient Self Care Activities:  Attends scheduled medical appointments  Patient Self Care Deficits:   Needs some occasional help with ADLs  Initial goal documentation    Follow Up Plan: LCSW will call client /daughter of client in next 4 weeks to talk with client or her daughter about community resources of help to client  Materials Provided: No  The patient/Theresa Watson, daughter of patient verbalized understanding of instructions provided today and declined a print copy of patient instruction materials.   Norva Riffle.Theresa Watson MSW, LCSW Licensed Clinical Social  Worker [facility] Care Management [phone]

## 2020-07-26 ENCOUNTER — Other Ambulatory Visit: Payer: Self-pay | Admitting: *Deleted

## 2020-07-26 DIAGNOSIS — E1149 Type 2 diabetes mellitus with other diabetic neurological complication: Secondary | ICD-10-CM

## 2020-07-26 MED ORDER — SITAGLIPTIN PHOSPHATE 100 MG PO TABS: 100.0000 mg | ORAL_TABLET | Freq: Every day | ORAL | 1 refills | Status: DC

## 2020-08-08 ENCOUNTER — Other Ambulatory Visit: Payer: Self-pay | Admitting: *Deleted

## 2020-08-08 MED ORDER — TRIAMTERENE-HCTZ 37.5-25 MG PO TABS: 1.0000 | ORAL_TABLET | Freq: Every day | ORAL | 0 refills | Status: DC

## 2020-08-08 MED ORDER — PRAVASTATIN SODIUM 80 MG PO TABS: 80.0000 mg | ORAL_TABLET | Freq: Every day | ORAL | 0 refills | Status: DC

## 2020-08-08 MED ORDER — POTASSIUM CHLORIDE CRYS ER 20 MEQ PO TBCR: 20.0000 meq | EXTENDED_RELEASE_TABLET | Freq: Every day | ORAL | 0 refills | Status: DC

## 2020-08-09 ENCOUNTER — Telehealth: Payer: Medicare Other

## 2020-08-13 ENCOUNTER — Other Ambulatory Visit: Payer: Self-pay | Admitting: Family Medicine

## 2020-08-13 DIAGNOSIS — E1149 Type 2 diabetes mellitus with other diabetic neurological complication: Secondary | ICD-10-CM

## 2020-08-22 ENCOUNTER — Other Ambulatory Visit: Payer: Self-pay

## 2020-08-22 DIAGNOSIS — E1149 Type 2 diabetes mellitus with other diabetic neurological complication: Secondary | ICD-10-CM

## 2020-08-22 DIAGNOSIS — K580 Irritable bowel syndrome with diarrhea: Secondary | ICD-10-CM

## 2020-08-22 MED ORDER — SITAGLIPTIN PHOSPHATE 100 MG PO TABS: 100.0000 mg | ORAL_TABLET | Freq: Every day | ORAL | 0 refills | Status: DC

## 2020-08-22 MED ORDER — OMEPRAZOLE 20 MG PO CPDR: 20.0000 mg | DELAYED_RELEASE_CAPSULE | Freq: Every day | ORAL | 0 refills | Status: DC

## 2020-08-22 NOTE — Telephone Encounter (Signed)
  Prescription Request  08/22/2020  What is the name of the medication or equipment? Januvia 100 mg, Omeprazole 20 mg  Have you contacted your pharmacy to request a refill? (if applicable) NO  Which pharmacy would you like this sent to? [facility]   Patient notified that their request is being sent to the clinical staff for review and that they should receive a response within 2 business days.

## 2020-08-22 NOTE — Telephone Encounter (Signed)
Pt aware refill sent to pharmacy 

## 2020-08-28 ENCOUNTER — Ambulatory Visit: Payer: Medicare Other | Admitting: Family Medicine

## 2020-09-03 ENCOUNTER — Ambulatory Visit: Payer: Medicare Other | Admitting: Family Medicine

## 2020-09-07 ENCOUNTER — Telehealth: Payer: Self-pay

## 2020-09-07 NOTE — Telephone Encounter (Signed)
Pt called stating that she received a missed call from [facility].  Explained to pt that it was most likely an automated call reminding pt that she is overdue for mammogram and flu shot.  Pt says she is not having anymore mammograms and got her flu shot this year at [facility].

## 2020-09-13 ENCOUNTER — Ambulatory Visit: Payer: Medicare Other | Admitting: Licensed Clinical Social Worker

## 2020-09-13 DIAGNOSIS — I639 Cerebral infarction, unspecified: Secondary | ICD-10-CM

## 2020-09-13 DIAGNOSIS — I10 Essential (primary) hypertension: Secondary | ICD-10-CM

## 2020-09-13 DIAGNOSIS — E1149 Type 2 diabetes mellitus with other diabetic neurological complication: Secondary | ICD-10-CM

## 2020-09-13 DIAGNOSIS — E1169 Type 2 diabetes mellitus with other specified complication: Secondary | ICD-10-CM

## 2020-09-13 DIAGNOSIS — K580 Irritable bowel syndrome with diarrhea: Secondary | ICD-10-CM

## 2020-09-13 NOTE — Chronic Care Management (AMB) (Signed)
Chronic Care Management    Clinical Social Work Follow Up Note  09/13/2020 Name: Theresa Watson MRN: [id number] DOB: [birth year]  Theresa Watson is a 86 y.o. year old female who is a primary care patient of [name], [name], MD. The CCM team was consulted for assistance with [name] .   Review of patient status, including review of consultants reports, other relevant assessments, and collaboration with appropriate care team members and the patient's provider was performed as part of comprehensive patient evaluation and provision of chronic care management services.    SDOH (Social Determinants of Health) assessments performed: No;risk for depression; risk for tobacco use; risk for stress; risk for physical inactivity  Flowsheet Row Chronic Care Management from 04/05/2020 in [facility]  PHQ-9 Total Score 2     GAD 7 : Generalized Anxiety Score 04/05/2020 04/01/2019  Nervous, Anxious, on Edge 0 3  Control/stop worrying 0 3  Worry too much - different things 0 2  Trouble relaxing 0 3  Restless 0 3  Easily annoyed or irritable 1 1  Afraid - awful might happen 0 1  Total GAD 7 Score 1 16  Anxiety Difficulty Somewhat difficult -    Outpatient Encounter Medications as of 09/13/2020  Medication Sig  . aspirin 81 MG EC tablet Take 81 mg by mouth daily.    . Calcium Carb-Cholecalciferol (CALCIUM 600 + D PO) Take 1 tablet by mouth daily.   . Cholecalciferol (VITAMIN D3) 2000 UNITS TABS Take 1 tablet by mouth daily.    . ferrous sulfate 325 (65 FE) MG EC tablet Take 325 mg by mouth daily with breakfast.    . gabapentin (NEURONTIN) 300 MG capsule TAKE 1 CAPSULE BY MOUTH  TWICE DAILY  . Garlic 003 MG TABS Take 1 tablet by mouth 2 (two) times daily.   Marland Kitchen geriatric multivitamins-minerals (ELDERTONIC/GEVRABON) ELIX Take 15 mLs by mouth daily.    . Omega-3 Fatty Acids (FISH OIL) 1000 MG CAPS Take 2 capsules by mouth daily.   Marland Kitchen omeprazole (PRILOSEC) 20 MG capsule Take  1 capsule (20 mg total) by mouth daily.  . potassium chloride SA (KLOR-CON) 20 MEQ tablet Take 1 tablet (20 mEq total) by mouth daily.  . pravastatin (PRAVACHOL) 80 MG tablet Take 1 tablet (80 mg total) by mouth daily.  . Simethicone (GAS-X EXTRA STRENGTH) 125 MG CAPS Take 1 capsule (125 mg total) by mouth 3 (three) times daily before meals.  . sitaGLIPtin (JANUVIA) 100 MG tablet Take 1 tablet (100 mg total) by mouth daily.  Marland Kitchen triamterene-hydrochlorothiazide (MAXZIDE-25) 37.5-25 MG tablet Take 1 tablet by mouth daily.  . vitamin C (ASCORBIC ACID) 500 MG tablet Take 500 mg by mouth daily.  Marland Kitchen VITAMIN E PO Take 1 capsule by mouth daily.   No facility-administered encounter medications on file as of 09/13/2020.    Goals    .  Client will talk with LCSW in next 30 days about community resources of assistance for client (pt-stated)      CARE PLAN ENTRY   Current Barriers:  Mobility issues (uses a cane to walk outdoors) of patient with chronic diagnoses of Hx CVA, Type 2 DM, HTN, IBS, HLD   Clinical Social Work Clinical Goal(s):  Marland Kitchen LCSW will call client in next 30 days to talk with client aobut community resources of help to client  Interventions:  . Talked with Theresa Watson, daughter of patient , about CCM program support . Talked with Theresa Watson about client appetite .  Talked with Theresa Watson about in home care needs of client . Talked with Theresa Watson about [name] . Talked with Theresa Watson about transport needs of client (client's  son, Theresa Watson, helps to transport client to and from client appointments) . Talked with Theresa Watson about upcoming client appointments . Talked with Theresa Watson about relaxation techniques of client (likes to go outdoors to sit and likes to do [name], watches TV) . Talked with Theresa Watson about social support network of client (client has support from her daughters  and from her son) . Encouraged [name] or client to call RNCM as needed to discuss the  nursing needs of client . Talked with Theresa Watson about medication procurement for client . Talked with Theresa Watson about client support as needed from Pharmacist, Dr. Lottie Dawson   Patient Self Care Activities:  Attends scheduled medical appointments  Patient Self Care Deficits:   Needs some occasional help with ADLs  Initial goal documentation     Follow Up Plan: LCSW will call client /daughter of client in next 4 weeks to talk with client or her daughter about community resources of help to client  Norva Riffle.Keandria Berrocal MSW, LCSW Licensed Clinical Social Worker [facility] [phone]

## 2020-09-13 NOTE — Patient Instructions (Addendum)
Licensed Clinical Social Worker Visit Information  Goals we discussed today:  .  Client will talk with LCSW in next 30 days about community resources of assistance for client (pt-stated)        CARE PLAN ENTRY   Current Barriers:  Mobility issues (uses a cane to walk outdoors) of patient with chronic diagnoses of Hx CVA, Type 2 DM, HTN, IBS, HLD   Clinical Social Work Clinical Goal(s):   LCSW will call client in next 30 days to talk with client aobut community resources of help to client  Interventions:   Talked with [name], daughter of patient , about CCM program support  Talked with [name] about client appetite  Talked with [name] about in home care needs of client  Talked with [name] about [name]  Talked with [name] about transport needs of client (client's  son, [name], helps to transport client to and from client appointments)  Talked with [name] about upcoming client appointments  Talked with [name] about relaxation techniques of client (likes to go outdoors to sit and likes to do [name], watches TV)  Talked with [name] about social support network of client (client has support from her daughters  and from her son)  Encouraged Teacher, early years/pre or client to call RNCM as needed to discuss the nursing needs of client  Talked with [name] about medication procurement for client  Talked with [name] about client support as needed from Pharmacist, Dr. Lottie Dawson   Patient Self Care Activities:  Attends scheduled medical appointments  Patient Self Care Deficits:   Needs some occasional help with ADLs  Initial goal documentation     Follow Up Plan:LCSW will call client/daughter of clientin next 4 weeks to talk with client or her daughter about community resources of help to client  Materials Provided: No  The patient/Theresa Watson, daughter of patient, verbalized understanding of instructions provided today  and declined a print copy of patient instruction materials.   Norva Riffle.Theresa Watson MSW, LCSW Licensed Clinical Social Worker [facility] [phone]

## 2020-09-14 ENCOUNTER — Ambulatory Visit (INDEPENDENT_AMBULATORY_CARE_PROVIDER_SITE_OTHER): Payer: Medicare Other

## 2020-09-14 ENCOUNTER — Encounter: Payer: Self-pay | Admitting: Family Medicine

## 2020-09-14 ENCOUNTER — Other Ambulatory Visit: Payer: Self-pay

## 2020-09-14 ENCOUNTER — Ambulatory Visit (INDEPENDENT_AMBULATORY_CARE_PROVIDER_SITE_OTHER): Payer: Medicare Other | Admitting: Family Medicine

## 2020-09-14 VITALS — BP 137/69 | HR 68 | Temp 97.3°F | Resp 20 | Ht 60.0 in | Wt 145.5 lb

## 2020-09-14 DIAGNOSIS — E785 Hyperlipidemia, unspecified: Secondary | ICD-10-CM

## 2020-09-14 DIAGNOSIS — E1149 Type 2 diabetes mellitus with other diabetic neurological complication: Secondary | ICD-10-CM | POA: Diagnosis not present

## 2020-09-14 DIAGNOSIS — Z780 Asymptomatic menopausal state: Secondary | ICD-10-CM

## 2020-09-14 DIAGNOSIS — E1169 Type 2 diabetes mellitus with other specified complication: Secondary | ICD-10-CM

## 2020-09-14 DIAGNOSIS — I10 Essential (primary) hypertension: Secondary | ICD-10-CM

## 2020-09-14 LAB — CMP14+EGFR
ALT: 49 IU/L — ABNORMAL HIGH (ref 0–32)
AST: 46 IU/L — ABNORMAL HIGH (ref 0–40)
Albumin/Globulin Ratio: 1.8 (ref 1.2–2.2)
Albumin: 4.4 g/dL (ref 3.6–4.6)
Alkaline Phosphatase: 106 IU/L (ref 44–121)
BUN/Creatinine Ratio: 18 (ref 12–28)
BUN: 17 mg/dL (ref 8–27)
Bilirubin Total: 0.5 mg/dL (ref 0.0–1.2)
CO2: 23 mmol/L (ref 20–29)
Calcium: 9.7 mg/dL (ref 8.7–10.3)
Chloride: 105 mmol/L (ref 96–106)
Creatinine, Ser: 0.92 mg/dL (ref 0.57–1.00)
GFR calc Af Amer: 66 mL/min/{1.73_m2} (ref >59)
GFR calc non Af Amer: 57 mL/min/{1.73_m2} — ABNORMAL LOW (ref >59)
Globulin, Total: 2.5 g/dL (ref 1.5–4.5)
Glucose: 126 mg/dL — ABNORMAL HIGH (ref 65–99)
Potassium: 4.5 mmol/L (ref 3.5–5.2)
Sodium: 143 mmol/L (ref 134–144)
Total Protein: 6.9 g/dL (ref 6.0–8.5)

## 2020-09-14 LAB — CBC WITH DIFFERENTIAL/PLATELET
Basophils Absolute: 0.1 10*3/uL (ref 0.0–0.2)
Basos: 1 %
EOS (ABSOLUTE): 0.1 10*3/uL (ref 0.0–0.4)
Eos: 2 %
Hematocrit: 46 % (ref 34.0–46.6)
Hemoglobin: 15.5 g/dL (ref 11.1–15.9)
Immature Grans (Abs): 0 10*3/uL (ref 0.0–0.1)
Immature Granulocytes: 0 %
Lymphocytes Absolute: 1.4 10*3/uL (ref 0.7–3.1)
Lymphs: 27 %
MCH: 28.3 pg (ref 26.6–33.0)
MCHC: 33.7 g/dL (ref 31.5–35.7)
MCV: 84 fL (ref 79–97)
Monocytes Absolute: 0.7 10*3/uL (ref 0.1–0.9)
Monocytes: 13 %
Neutrophils Absolute: 2.9 10*3/uL (ref 1.4–7.0)
Neutrophils: 57 %
Platelets: 179 10*3/uL (ref 150–450)
RBC: 5.48 x10E6/uL — ABNORMAL HIGH (ref 3.77–5.28)
RDW: 15 % (ref 11.7–15.4)
WBC: 5.2 10*3/uL (ref 3.4–10.8)

## 2020-09-14 LAB — LIPID PANEL
Chol/HDL Ratio: 2.7 ratio (ref 0.0–4.4)
Cholesterol, Total: 214 mg/dL — ABNORMAL HIGH (ref 100–199)
HDL: 79 mg/dL (ref >39)
LDL Chol Calc (NIH): 109 mg/dL — ABNORMAL HIGH (ref 0–99)
Triglycerides: 150 mg/dL — ABNORMAL HIGH (ref 0–149)
VLDL Cholesterol Cal: 26 mg/dL (ref 5–40)

## 2020-09-14 LAB — BAYER DCA HB A1C WAIVED: HB A1C (BAYER DCA - WAIVED): 6.3 % (ref <7.0)

## 2020-09-14 NOTE — Progress Notes (Signed)
Subjective:  Patient ID: Theresa Watson, female    DOB: April 25, 1933  Age: 86 y.o. MRN: [id number]  CC: Medical Management of Chronic Issues   HPI Theresa Watson presents forFollow-up of diabetes. Patient checks blood sugar at home. Log returned & reviewed  100-130 fasting and 140-200 postprandial Patient denies symptoms such as polyuria, polydipsia, excessive hunger, nausea No significant hypoglycemic spells noted. Medications reviewed. Pt reports taking them regularly without complication/adverse reaction being reported today.    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   She has a past surgical history that includes Carpal tunnel release; Cholecystectomy; Vaginal hysterectomy; lumbar back surgery; and Eye surgery (Bilateral).   Her family history includes Arthritis in her sister, son, and son; Brain cancer in her mother; Cancer in her maternal grandfather, mother, and son; Colitis in her son; Diabetes in her son; GI Bleed in her father; Hyperlipidemia in her son; Lymphoma in her sister; Pancreatic cancer in her brother; Suicidality in her paternal grandfather.She reports that she quit smoking about 8 years ago. Her smoking use included cigarettes. She started smoking about 66 years ago. She smoked 0.50 packs per day. She has never used smokeless tobacco. She reports that she does not drink alcohol and does not use drugs.  Current Outpatient Medications on File Prior to Visit  Medication Sig Dispense Refill  . aspirin 81 MG EC tablet Take 81 mg by mouth daily.    . Calcium  Carb-Cholecalciferol (CALCIUM 600 + D PO) Take 1 tablet by mouth daily.     . Cholecalciferol (VITAMIN D3) 2000 UNITS TABS Take 1 tablet by mouth daily.    . ferrous sulfate 325 (65 FE) MG EC tablet Take 325 mg by mouth daily with breakfast.    . gabapentin (NEURONTIN) 300 MG capsule TAKE 1 CAPSULE BY MOUTH  TWICE DAILY 180 capsule 0  . Garlic 950 MG TABS Take 1 tablet by mouth 2 (two) times daily.    Theresa Watson Kitchen geriatric multivitamins-minerals (ELDERTONIC/GEVRABON) ELIX Take 15 mLs by mouth daily.    . Omega-3 Fatty Acids (FISH OIL) 1000 MG CAPS Take 2 capsules by mouth daily.    Theresa Watson Kitchen omeprazole (PRILOSEC) 20 MG capsule Take 1 capsule (20 mg total) by mouth daily. 90 capsule 0  . potassium chloride SA (KLOR-CON) 20 MEQ tablet Take 1 tablet (20 mEq total) by mouth daily. 90 tablet 0  . pravastatin (PRAVACHOL) 80 MG tablet Take 1 tablet (80 mg total) by mouth daily. 90 tablet 0  . Simethicone (GAS-X EXTRA STRENGTH) 125 MG CAPS Take 1 capsule (125 mg total) by mouth 3 (three) times daily before meals. 90 capsule 2  . sitaGLIPtin (JANUVIA) 100 MG tablet Take 1 tablet (100 mg total) by mouth daily. 90 tablet 0  . triamterene-hydrochlorothiazide (MAXZIDE-25) 37.5-25 MG tablet Take 1 tablet by mouth daily. 90 tablet 0  . vitamin C (ASCORBIC ACID) 500 MG tablet Take 500 mg by mouth daily.    Theresa Watson Kitchen VITAMIN E PO Take 1 capsule by mouth daily.     No current facility-administered medications on file prior to visit.    ROS Review of Systems  Constitutional: Negative.   HENT: Negative for congestion.   Eyes: Negative for visual  disturbance.  Respiratory: Negative for shortness of breath.   Cardiovascular: Negative for chest pain.  Gastrointestinal: Negative for abdominal pain, constipation, diarrhea, nausea and vomiting.  Genitourinary: Negative for difficulty urinating.  Musculoskeletal: Negative for arthralgias and myalgias.  Neurological: Negative for headaches.  Psychiatric/Behavioral: Negative for sleep  disturbance.    Objective:  BP 137/69   Pulse 68   Temp (!) 97.3 F (36.3 C) (Temporal)   Resp 20   Ht 5' (1.524 m)   Wt 145 lb 8 oz (66 kg)   SpO2 95%   BMI 28.42 kg/m   BP Readings from Last 3 Encounters:  09/14/20 137/69  05/29/20 109/60  12/30/19 123/73    Wt Readings from Last 3 Encounters:  09/14/20 145 lb 8 oz (66 kg)  05/29/20 145 lb 6.4 oz (66 kg)  12/30/19 145 lb 12.8 oz (66.1 kg)     Physical Exam Constitutional:      General: She is not in acute distress.    Appearance: She is well-developed and well-nourished.  HENT:     Head: Normocephalic and atraumatic.  Eyes:     Conjunctiva/sclera: Conjunctivae normal.     Pupils: Pupils are equal, round, and reactive to light.  Neck:     Thyroid: No thyromegaly.  Cardiovascular:     Rate and Rhythm: Normal rate and regular rhythm.     Heart sounds: Normal heart sounds. No murmur heard.   Pulmonary:     Effort: Pulmonary effort is normal. No respiratory distress.     Breath sounds: Normal breath sounds. No wheezing or rales.  Abdominal:     General: Bowel sounds are normal. There is no distension.     Palpations: Abdomen is soft.     Tenderness: There is no abdominal tenderness.  Musculoskeletal:        General: Normal range of motion.     Cervical back: Normal range of motion and neck supple.  Lymphadenopathy:     Cervical: No cervical adenopathy.  Skin:    General: Skin is warm and dry.  Neurological:     Mental Status: She is alert and oriented to person, place, and time.  Psychiatric:        Mood and Affect: Mood and affect normal.        Behavior: Behavior normal.        Thought Content: Thought content normal.        Judgment: Judgment normal.       Assessment & Plan:   Theresa Watson was seen today for medical management of chronic issues.  Diagnoses and all orders for this visit:  Type 2 diabetes mellitus with neurological manifestations, controlled ([place]) -     Bayer DCA Hb A1c Waived -      CBC with Differential/Platelet -     CMP14+EGFR -     Lipid panel  Essential hypertension -     Bayer DCA Hb A1c Waived -     CBC with Differential/Platelet -     CMP14+EGFR -     Lipid panel  Hyperlipidemia associated with type 2 diabetes mellitus (HCC) -     Bayer DCA Hb A1c Waived -     CBC with Differential/Platelet -     CMP14+EGFR -     Lipid panel  Post-menopausal -     DG [facility] DEXA; Future      I am having Theresa Watson maintain her aspirin, Vitamin D3, geriatric multivitamins-minerals, Fish Oil, ferrous sulfate, Garlic, Calcium Carb-Cholecalciferol (  CALCIUM 600 + D PO), vitamin C, VITAMIN E PO, Simethicone, triamterene-hydrochlorothiazide, pravastatin, potassium chloride SA, gabapentin, sitaGLIPtin, and omeprazole.  No orders of the defined types were placed in this encounter.    Follow-up: Return in about 3 months (around 12/13/2020).  Theresa Watson, M.D.

## 2020-09-18 ENCOUNTER — Encounter: Payer: Self-pay | Admitting: Family Medicine

## 2020-09-21 ENCOUNTER — Other Ambulatory Visit: Payer: Self-pay | Admitting: Family Medicine

## 2020-09-21 DIAGNOSIS — M810 Age-related osteoporosis without current pathological fracture: Secondary | ICD-10-CM | POA: Diagnosis not present

## 2020-09-21 MED ORDER — ALENDRONATE SODIUM 70 MG PO TABS: 70.0000 mg | ORAL_TABLET | ORAL | 11 refills | Status: DC

## 2020-09-25 ENCOUNTER — Other Ambulatory Visit: Payer: Self-pay

## 2020-09-25 IMAGING — MR MRI HEAD WITHOUT CONTRAST
8 of 10 series · 35 of 48 positions shown · non-contrast
Comparison: Head CT earlier same day

CLINICAL DATA: Acute presentation with altered level of
consciousness and confusion.

EXAM:
MRI HEAD WITHOUT CONTRAST
TECHNIQUE: Multiplanar, multiecho pulse sequences of the brain and surrounding
structures were obtained without intravenous contrast.

[Series 3: T1 · sagittal · 5.0mm · 0.40mm/px · 3 of 20 slices shown (1 of 2)]
[im 1/20]
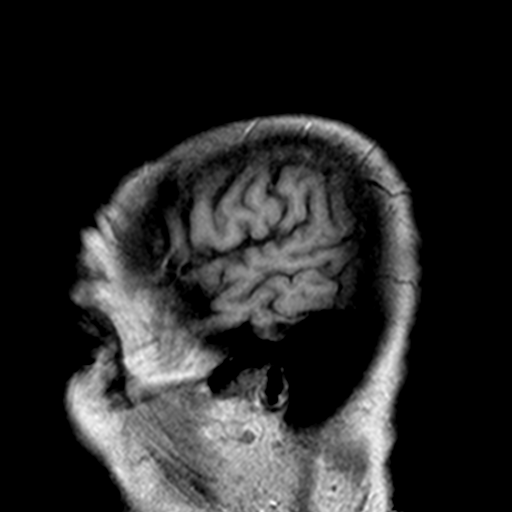
[im 10/20]
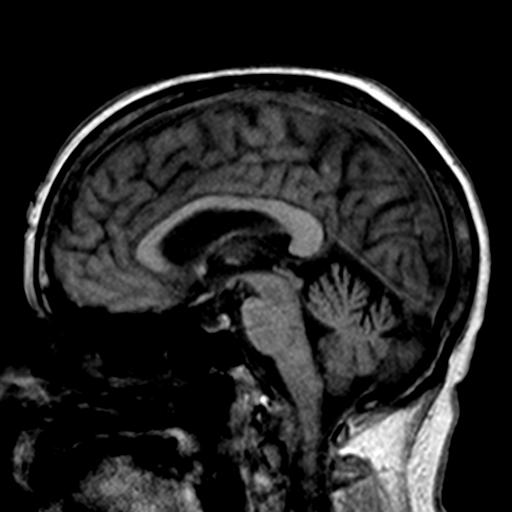
[im 20/20]
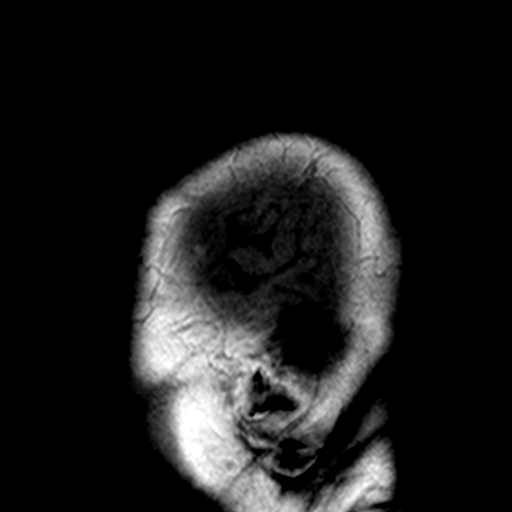

[Series 4: DWI · axial · 3.0mm · 0.69mm/px · z∈[-115,+46]mm · 7 of 55 slices shown (1 of 2)]
[im 1/55]
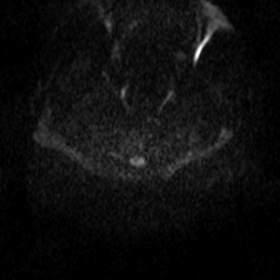
[im 10/55]
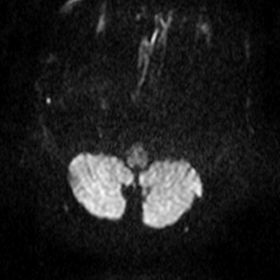
[im 19/55]
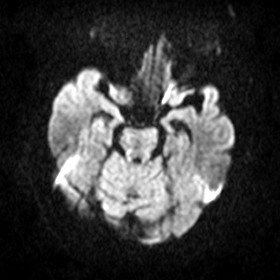
[im 28/55]
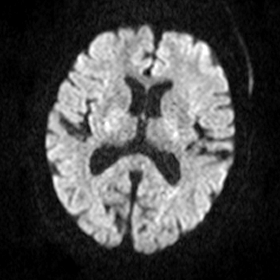
[im 37/55]
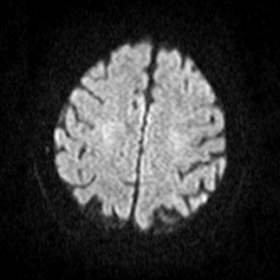
[im 46/55]
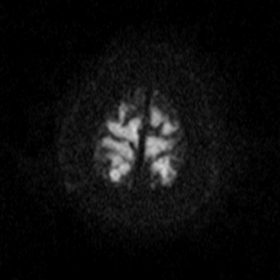
[im 55/55]
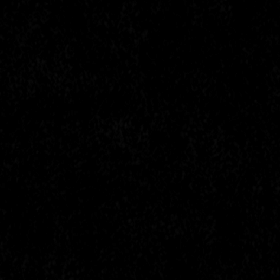

[Series 6: DWI · coronal · 5.0mm · 0.48mm/px · 4 of 34 slices shown (2 of 2)]
[im 1/34]
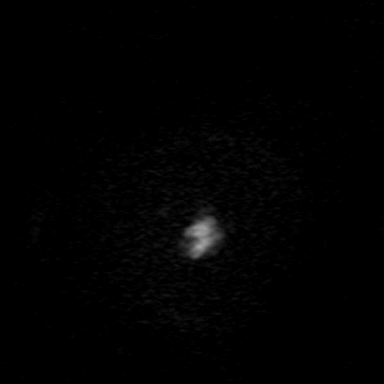
[im 12/34]
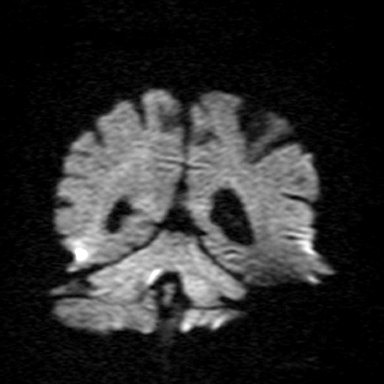
[im 23/34]
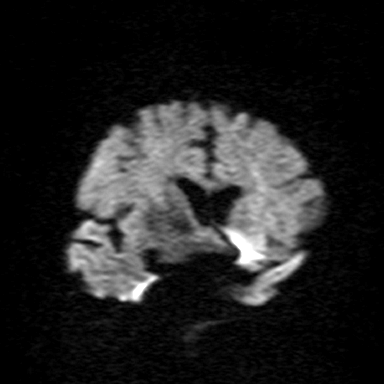
[im 34/34]
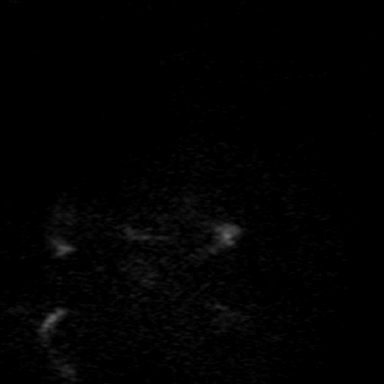

[Series 8: T2 · axial · 5.0mm · 0.68mm/px · z∈[-104,+38]mm · 3 of 23 slices shown (1 of 3)]
[im 1/23]
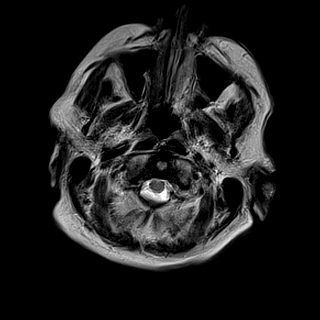
[im 12/23]
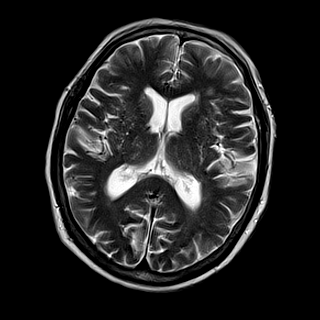
[im 23/23]
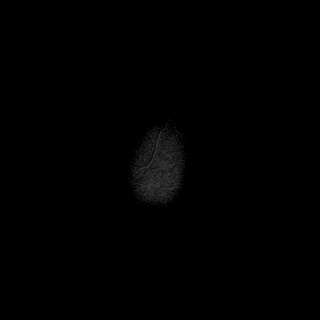

[Series 9: T2 · axial · 5.0mm · 0.41mm/px · z∈[-98,+31]mm · 3 of 21 slices shown (2 of 3)]
[im 1/21]
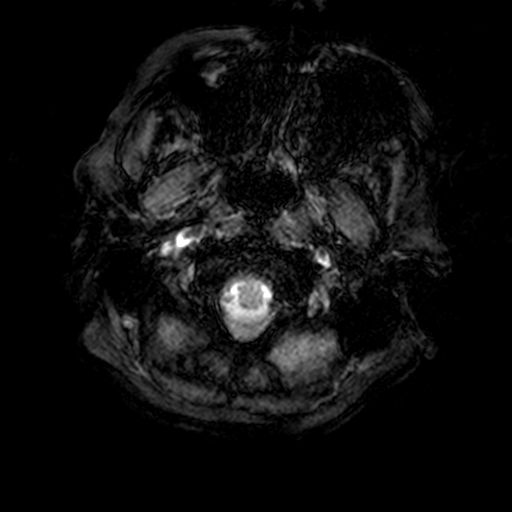
[im 11/21]
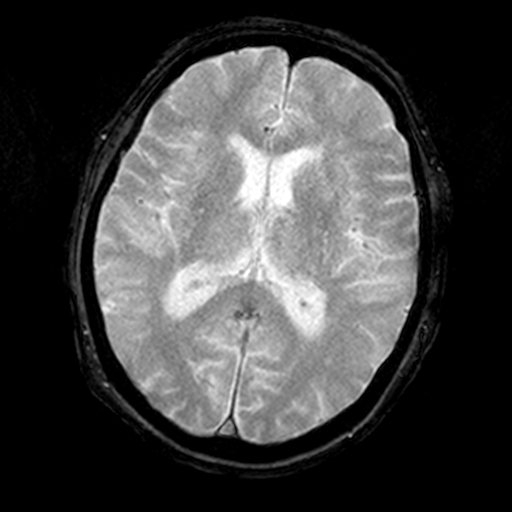
[im 21/21]
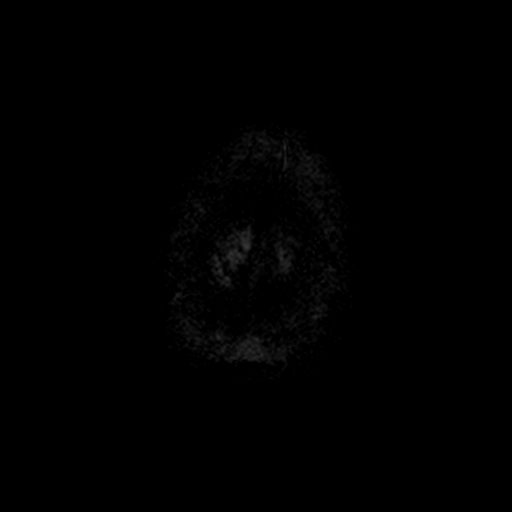

[Series 10: FLAIR · axial · 3.0mm · 0.82mm/px · z∈[-102,+35]mm · 6 of 47 slices shown]
[im 1/47]
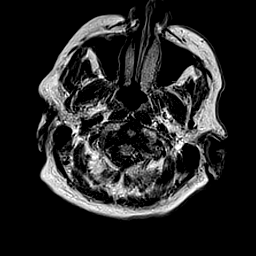
[im 10/47]
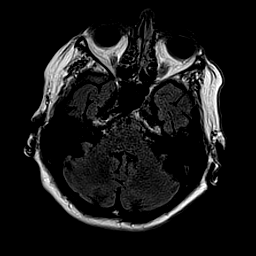
[im 19/47]
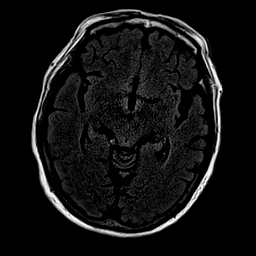
[im 28/47]
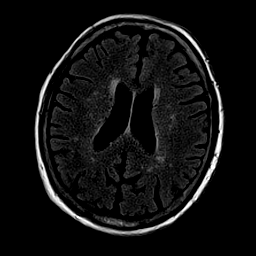
[im 37/47]
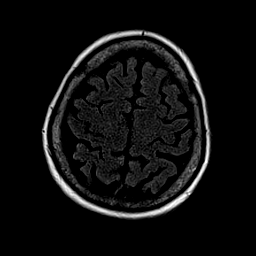
[im 47/47]
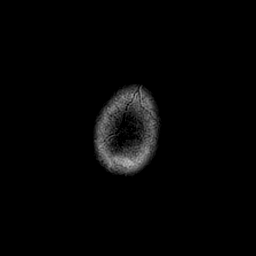

[Series 11: T1 · axial · 2.0mm · 0.43mm/px · z∈[-116,-3]mm · 6 of 77 slices shown (2 of 2)]
[im 1/77]
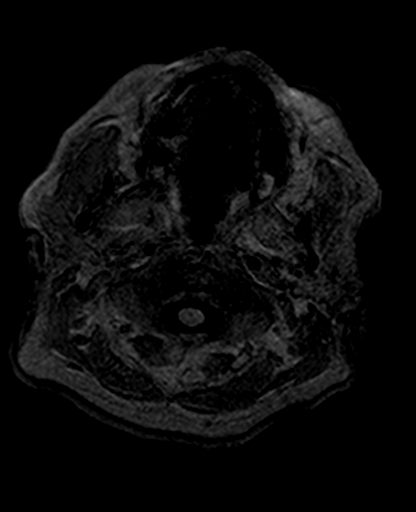
[im 10/77]
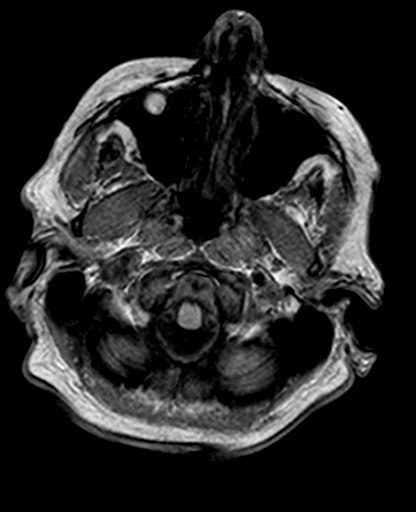
[im 20/77]
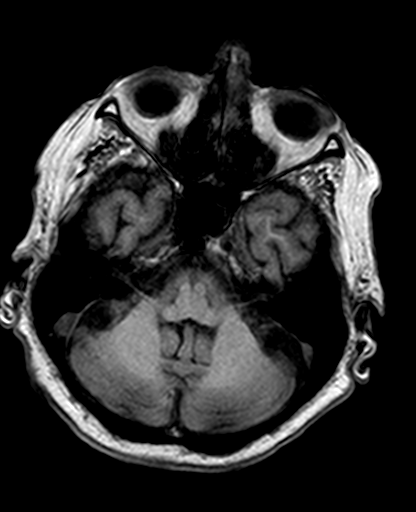
[im 29/77]
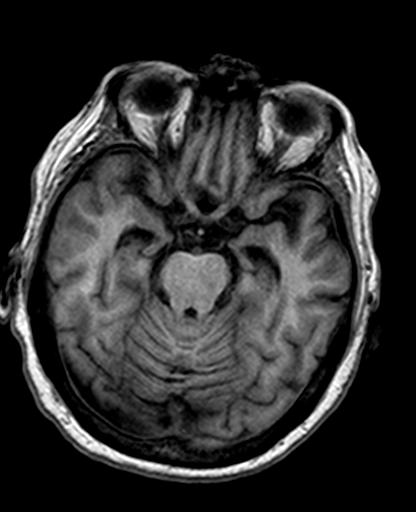
[im 48/77]
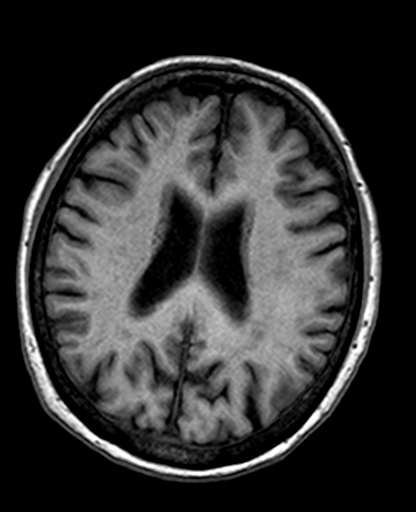
[im 58/77]
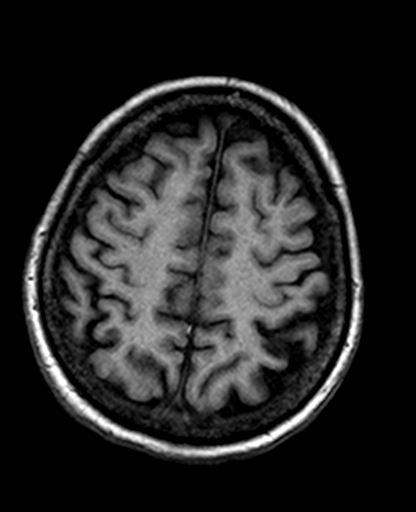

[Series 12: T2 · coronal · 5.0mm · 0.64mm/px · 3 of 28 slices shown (3 of 3)]
[im 1/28]
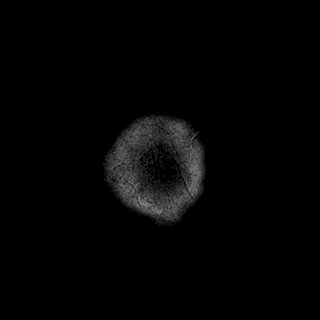
[im 14/28]
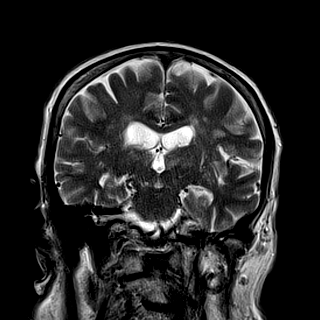
[im 28/28]
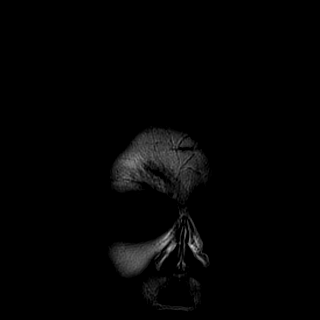

[35 of 48 positions shown; findings below may reference images not displayed]

FINDINGS: Brain: Diffusion imaging shows a small focus restricted diffusion in
the right thalamus consistent with acute infarction. No other acute
insult. There are mild chronic small-vessel ischemic changes of the
pons. No focal cerebellar abnormality. Cerebral hemispheres show
moderate changes of chronic small vessel disease affecting the deep
and subcortical white matter. No cortical or large vessel territory
infarction. No mass lesion, hemorrhage, hydrocephalus or extra-axial
collection.

Vascular: Major vessels at the base of the brain show flow.

Skull and upper cervical spine: Negative

Sinuses/Orbits: Clear/normal

Other: None
IMPRESSION: Small area of acute infarction suspected in the right thalamus. No
swelling or mass effect.

Chronic small-vessel ischemic changes elsewhere throughout the brain
as outlined above.

## 2020-09-25 NOTE — Progress Notes (Signed)
error 

## 2020-10-19 ENCOUNTER — Ambulatory Visit: Payer: Medicare Other | Admitting: Licensed Clinical Social Worker

## 2020-10-19 DIAGNOSIS — E1149 Type 2 diabetes mellitus with other diabetic neurological complication: Secondary | ICD-10-CM

## 2020-10-19 DIAGNOSIS — K580 Irritable bowel syndrome with diarrhea: Secondary | ICD-10-CM

## 2020-10-19 DIAGNOSIS — I10 Essential (primary) hypertension: Secondary | ICD-10-CM

## 2020-10-19 DIAGNOSIS — I639 Cerebral infarction, unspecified: Secondary | ICD-10-CM

## 2020-10-19 DIAGNOSIS — E1169 Type 2 diabetes mellitus with other specified complication: Secondary | ICD-10-CM

## 2020-10-19 NOTE — Patient Instructions (Addendum)
Licensed Clinical Social Worker Visit Information  Goals we discussed today:   .  Client will talk with LCSW in next 30 days about community resources of assistance for client (pt-stated)        CARE PLAN ENTRY   Current Barriers:  Mobility issues (uses a cane to walk outdoors) of patient with chronic diagnoses of Hx CVA, Type 2 DM, HTN, IBS, HLD   Clinical Social Work Clinical Goal(s):   LCSW will call client in next 30 days to talk with client aobut community resources of help to client  Interventions:    Talked with Theresa Watson, daughter of patient , about CCM program support  Talked with [name] about client appetite  Talked with [name] about in home care needs of client  Talked with [name] about [name]  Talked with [name] about transport needs of client (client's  son, [name], helps to transport client to and from client appointments)  Talked with [name] about upcoming client appointments  Talked with [name] about relaxation techniques of client (likes to go outdoors to sit and likes to do [name], watches TV)  Talked with [name] about social support network of client (client has support from her daughters  and from her son)  Encouraged Teacher, early years/pre or client to call RNCM as needed to discuss the nursing needs of client  Talked with [name] about vision needs of client (wears glasses)  Talked with [name] about hearing needs of client  Patient Self Care Activities:  Attends scheduled medical appointments  Patient Self Care Deficits:   Needs some occasional help with ADLs  Initial goal documentation     Follow Up Plan:LCSW will call client/daughter of clientin next 4 weeks to talk with client or her daughter about community resources of help to client  Materials Provided: No  The patient Theresa Watson, daughter of patient, verbalized understanding of instructions provided today and declined a print copy  of patient instruction materials.   [name] MSW, LCSW Licensed Clinical Social Worker [name] [phone]

## 2020-10-19 NOTE — Chronic Care Management (AMB) (Signed)
Chronic Care Management    Clinical Social Work Follow Up Note  10/19/2020 Name: Theresa Watson MRN: [id number] DOB: Jan 09, 1933  Theresa Watson is a 87 y.o. year old female who is a primary care patient of [name], Theresa Gash, MD. The CCM team was consulted for assistance with [name] .   Review of patient status, including review of consultants reports, other relevant assessments, and collaboration with appropriate care team members and the patient's provider was performed as part of comprehensive patient evaluation and provision of chronic care management services.    SDOH (Social Determinants of Health) assessments performed: No;risk for depression; risk for tobacco use; risk for stress; risk for physical inactivity  Flowsheet Row Chronic Care Management from 04/05/2020 in [facility]  PHQ-9 Total Score 2      GAD 7 : Generalized Anxiety Score 04/05/2020 04/01/2019  Nervous, Anxious, on Edge 0 3  Control/stop worrying 0 3  Worry too much - different things 0 2  Trouble relaxing 0 3  Restless 0 3  Easily annoyed or irritable 1 1  Afraid - awful might happen 0 1  Total GAD 7 Score 1 16  Anxiety Difficulty Somewhat difficult -    Outpatient Encounter Medications as of 10/19/2020  Medication Sig  . alendronate (FOSAMAX) 70 MG tablet Take 1 tablet (70 mg total) by mouth every 7 (seven) days. Take with a full glass of water on an empty stomach. Do not lay down for at least 2 hours  . aspirin 81 MG EC tablet Take 81 mg by mouth daily.  . Calcium Carb-Cholecalciferol (CALCIUM 600 + D PO) Take 1 tablet by mouth daily.   . Cholecalciferol (VITAMIN D3) 2000 UNITS TABS Take 1 tablet by mouth daily.  . ferrous sulfate 325 (65 FE) MG EC tablet Take 325 mg by mouth daily with breakfast.  . gabapentin (NEURONTIN) 300 MG capsule TAKE 1 CAPSULE BY MOUTH  TWICE DAILY  . Garlic 412 MG TABS Take 1 tablet by mouth 2 (two) times daily.  Marland Kitchen geriatric multivitamins-minerals  (ELDERTONIC/GEVRABON) ELIX Take 15 mLs by mouth daily.  . Omega-3 Fatty Acids (FISH OIL) 1000 MG CAPS Take 2 capsules by mouth daily.  Marland Kitchen omeprazole (PRILOSEC) 20 MG capsule Take 1 capsule (20 mg total) by mouth daily.  . potassium chloride SA (KLOR-CON) 20 MEQ tablet Take 1 tablet (20 mEq total) by mouth daily.  . pravastatin (PRAVACHOL) 80 MG tablet Take 1 tablet (80 mg total) by mouth daily.  . Simethicone (GAS-X EXTRA STRENGTH) 125 MG CAPS Take 1 capsule (125 mg total) by mouth 3 (three) times daily before meals.  . sitaGLIPtin (JANUVIA) 100 MG tablet Take 1 tablet (100 mg total) by mouth daily.  Marland Kitchen triamterene-hydrochlorothiazide (MAXZIDE-25) 37.5-25 MG tablet Take 1 tablet by mouth daily.  . vitamin C (ASCORBIC ACID) 500 MG tablet Take 500 mg by mouth daily.  Marland Kitchen VITAMIN E PO Take 1 capsule by mouth daily.   No facility-administered encounter medications on file as of 10/19/2020.    Goals    .  Client will talk with LCSW in next 30 days about community resources of assistance for client (pt-stated)      CARE PLAN ENTRY   Current Barriers:  Mobility issues (uses a cane to walk outdoors) of patient with chronic diagnoses of Hx CVA, Type 2 DM, HTN, IBS, HLD   Clinical Social Work Clinical Goal(s):  Marland Kitchen LCSW will call client in next 30 days to talk with client aobut  community resources of help to client  Interventions:   . Talked with Theresa Watson, daughter of patient , about CCM program support . Talked with Theresa Watson about client appetite . Talked with Theresa Watson about in home care needs of client . Talked with Theresa Watson about [name] . Talked with Theresa Watson about transport needs of client (client's  son, Theresa Watson, helps to transport client to and from client appointments) . Talked with Theresa Watson about upcoming client appointments . Talked with Theresa Watson about relaxation techniques of client (likes to go outdoors to sit and likes to do [name], watches TV) . Talked with  Theresa Watson about social support network of client (client has support from her daughters  and from her son) . Encouraged Theresa Watson or client to call RNCM as needed to discuss the nursing needs of client . Talked with Theresa Watson about vision needs of client (wears glasses)  Talked with Theresa Watson about hearing needs of client  Patient Self Care Activities:  Attends scheduled medical appointments  Patient Self Care Deficits:   Needs some occasional help with ADLs  Initial goal documentation     Follow Up Plan:LCSW will call client/daughter of clientin next 4 weeks to talk with client or her daughter about community resources of help to client  Theresa Watson.Theresa Watson MSW, LCSW Licensed Clinical Social Worker [facility] [phone]

## 2020-10-23 ENCOUNTER — Other Ambulatory Visit: Payer: Self-pay | Admitting: Family Medicine

## 2020-10-23 DIAGNOSIS — K580 Irritable bowel syndrome with diarrhea: Secondary | ICD-10-CM

## 2020-10-23 DIAGNOSIS — E1149 Type 2 diabetes mellitus with other diabetic neurological complication: Secondary | ICD-10-CM

## 2020-11-11 ENCOUNTER — Other Ambulatory Visit: Payer: Self-pay | Admitting: Family Medicine

## 2020-11-11 DIAGNOSIS — E1149 Type 2 diabetes mellitus with other diabetic neurological complication: Secondary | ICD-10-CM

## 2020-11-22 ENCOUNTER — Ambulatory Visit: Payer: Medicare Other | Admitting: Licensed Clinical Social Worker

## 2020-11-22 DIAGNOSIS — E1169 Type 2 diabetes mellitus with other specified complication: Secondary | ICD-10-CM

## 2020-11-22 DIAGNOSIS — I10 Essential (primary) hypertension: Secondary | ICD-10-CM

## 2020-11-22 DIAGNOSIS — E1149 Type 2 diabetes mellitus with other diabetic neurological complication: Secondary | ICD-10-CM

## 2020-11-22 DIAGNOSIS — I639 Cerebral infarction, unspecified: Secondary | ICD-10-CM

## 2020-11-22 DIAGNOSIS — K580 Irritable bowel syndrome with diarrhea: Secondary | ICD-10-CM

## 2020-11-22 NOTE — Patient Instructions (Addendum)
Licensed Clinical Social Worker Visit Information  Materials Provided: No  11/22/2020  Name: Theresa Watson     MRN: [id number]       DOB: 08-06-1933  Theresa Watson is a 87 y.o. year old female who is a primary care patient of [name], [name], MD. The CCM team was consulted for assistance with [name] .   Review of patient status, including review of consultants reports, other relevant assessments, and collaboration with appropriate care team members and the patient's provider was performed as part of comprehensive patient evaluation and provision of chronic care management services.    SDOH (Social Determinants of Health) assessments performed: No; risk for tobacco use; risk for depression; risk for stress; risk for physical inactivity  LCSW called client home phone number today but LCSW was not able to speak via phone with client today. LCSW did leave phone message for client requesting that she call LCSW at [phone].  LCSW also called phone number today for [name], daughter of patient. However, LCSW was not able to speak via phone today with [name]. LCSW did leave phone message for [facility] requesting that she call LCSW at [phone]  Follow Up Plan:LCSW will call client/daughter of clientin next 4 weeks to talk with client or her daughter about community resources of help to client  LCSW was not able to speak via phone today with client or daughter of client; thus, the client or her daughter were not able to verbalize understanding of instructions provided today and were not able to accept or decline a print copy of patient instruction materials.   Norva Riffle.Ohm Dentler MSW, LCSW Licensed Clinical Social Worker [facility] Care Management [phone]

## 2020-11-22 NOTE — Chronic Care Management (AMB) (Signed)
Chronic Care Management    Clinical Social Work Follow Up Note  11/22/2020 Name: Theresa Watson MRN: [id number] DOB: Feb 24, 1933  Theresa Watson is a 87 y.o. year old female who is a primary care patient of [name], [name], MD. The CCM team was consulted for assistance with [name] .   Review of patient status, including review of consultants reports, other relevant assessments, and collaboration with appropriate care team members and the patient's provider was performed as part of comprehensive patient evaluation and provision of chronic care management services.    SDOH (Social Determinants of Health) assessments performed: No; risk for tobacco use; risk for depression; risk for stress; risk for physical inactivity  Flowsheet Row Chronic Care Management from 04/05/2020 in [facility]  PHQ-9 Total Score 2      GAD 7 : Generalized Anxiety Score 04/05/2020 04/01/2019  Nervous, Anxious, on Edge 0 3  Control/stop worrying 0 3  Worry too much - different things 0 2  Trouble relaxing 0 3  Restless 0 3  Easily annoyed or irritable 1 1  Afraid - awful might happen 0 1  Total GAD 7 Score 1 16  Anxiety Difficulty Somewhat difficult -    Outpatient Encounter Medications as of 11/22/2020  Medication Sig  . alendronate (FOSAMAX) 70 MG tablet Take 1 tablet (70 mg total) by mouth every 7 (seven) days. Take with a full glass of water on an empty stomach. Do not lay down for at least 2 hours  . aspirin 81 MG EC tablet Take 81 mg by mouth daily.  . Calcium Carb-Cholecalciferol (CALCIUM 600 + D PO) Take 1 tablet by mouth daily.   . Cholecalciferol (VITAMIN D3) 2000 UNITS TABS Take 1 tablet by mouth daily.  . ferrous sulfate 325 (65 FE) MG EC tablet Take 325 mg by mouth daily with breakfast.  . gabapentin (NEURONTIN) 300 MG capsule TAKE 1 CAPSULE BY MOUTH  TWICE DAILY  . Garlic 400 MG TABS Take 1 tablet by mouth 2 (two) times daily.  Marland Kitchen geriatric multivitamins-minerals  (ELDERTONIC/GEVRABON) ELIX Take 15 mLs by mouth daily.  Marland Kitchen glucose blood (ACCU-CHEK AVIVA PLUS) test strip TEST BLOOD SUGAR TWICE  DAILY  . JANUVIA 100 MG tablet TAKE 1 TABLET BY MOUTH  DAILY  . Omega-3 Fatty Acids (FISH OIL) 1000 MG CAPS Take 2 capsules by mouth daily.  Marland Kitchen omeprazole (PRILOSEC) 20 MG capsule TAKE 1 CAPSULE BY MOUTH  DAILY  . potassium chloride SA (KLOR-CON) 20 MEQ tablet TAKE 1 TABLET BY MOUTH  DAILY  . pravastatin (PRAVACHOL) 80 MG tablet TAKE 1 TABLET BY MOUTH  DAILY  . Simethicone (GAS-X EXTRA STRENGTH) 125 MG CAPS Take 1 capsule (125 mg total) by mouth 3 (three) times daily before meals.  . triamterene-hydrochlorothiazide (MAXZIDE-25) 37.5-25 MG tablet TAKE 1 TABLET BY MOUTH  DAILY  . vitamin C (ASCORBIC ACID) 500 MG tablet Take 500 mg by mouth daily.  Marland Kitchen VITAMIN E PO Take 1 capsule by mouth daily.   No facility-administered encounter medications on file as of 11/22/2020.    LCSW called client home phone number today but LCSW was not able to speak via phone with client today. LCSW did leave phone message for client requesting that she call LCSW at [phone].  LCSW also called phone number today for Theresa Watson, daughter of patient. However, LCSW was not able to speak via phone today with Theresa Watson. LCSW did leave phone message for Theresa Watson requesting that she call LCSW at [phone]  Follow Up Plan:LCSW will call client/daughter of clientin next 4 weeks to talk with client or her daughter about community resources of help to client  Norva Riffle.Morell Mears MSW, LCSW Licensed Clinical Social Worker [facility] Care Management [phone]

## 2020-12-17 ENCOUNTER — Ambulatory Visit: Payer: Medicare Other | Admitting: Family Medicine

## 2020-12-24 ENCOUNTER — Telehealth: Payer: Medicare Other

## 2020-12-26 ENCOUNTER — Other Ambulatory Visit: Payer: Self-pay

## 2020-12-26 ENCOUNTER — Encounter: Payer: Self-pay | Admitting: Family Medicine

## 2020-12-26 ENCOUNTER — Ambulatory Visit (INDEPENDENT_AMBULATORY_CARE_PROVIDER_SITE_OTHER): Payer: Medicare Other | Admitting: Family Medicine

## 2020-12-26 VITALS — BP 133/68 | HR 77 | Temp 98.0°F | Ht 60.0 in | Wt 145.4 lb

## 2020-12-26 DIAGNOSIS — D485 Neoplasm of uncertain behavior of skin: Secondary | ICD-10-CM | POA: Diagnosis not present

## 2020-12-26 DIAGNOSIS — E785 Hyperlipidemia, unspecified: Secondary | ICD-10-CM

## 2020-12-26 DIAGNOSIS — E1169 Type 2 diabetes mellitus with other specified complication: Secondary | ICD-10-CM

## 2020-12-26 DIAGNOSIS — E1149 Type 2 diabetes mellitus with other diabetic neurological complication: Secondary | ICD-10-CM | POA: Diagnosis not present

## 2020-12-26 LAB — BAYER DCA HB A1C WAIVED: HB A1C (BAYER DCA - WAIVED): 6.2 % (ref <7.0)

## 2020-12-26 NOTE — Progress Notes (Signed)
Subjective:  Patient ID: Theresa Watson, female    DOB: 1933/03/22  Age: 87 y.o. MRN: [id number]  CC: Medical Management of Chronic Issues   HPI [name] presents forFollow-up of diabetes. Patient checks blood sugar at home.  Log reviewed. Patient denies symptoms such as polyuria, polydipsia, excessive hunger, nausea No significant hypoglycemic spells noted. Medications reviewed. Pt reports taking them regularly without complication/adverse reaction being reported today.   Patient has a lesion on the right forearm she would like to have removed.  Its been there for quite some time but has gotten bigger recently.  History [name] has a past medical history of Colon polyp, Diabetes mellitus without complication ([place]), Diverticulosis, Esophagitis, IBS (irritable bowel syndrome), Kidney stone, Lumbosacral spondylosis without myelopathy, Obesity, mild, Other and unspecified hyperlipidemia, Pain in joint, shoulder region, Prolapse of vaginal walls without mention of uterine prolapse, Stroke ([place]), and Symptomatic menopausal or female climacteric states.   She has a past surgical history that includes Carpal tunnel release; Cholecystectomy; Vaginal hysterectomy; lumbar back surgery; and Eye surgery (Bilateral).   Her family history includes Arthritis in her sister, son, and son; Brain cancer in her mother; Cancer in her maternal grandfather, mother, and son; Colitis in her son; Diabetes in her son; GI Bleed in her father; Hyperlipidemia in her son; Lymphoma in her sister; Pancreatic cancer in her brother; Suicidality in her paternal grandfather.She reports that she quit smoking about 8 years ago. Her smoking use included cigarettes. She started smoking about 67 years ago. She smoked 0.50 packs per day. She has never used smokeless tobacco. She reports that she does not drink alcohol and does not use drugs.  Current Outpatient Medications on File Prior to Visit  Medication Sig Dispense  Refill  . alendronate (FOSAMAX) 70 MG tablet Take 1 tablet (70 mg total) by mouth every 7 (seven) days. Take with a full glass of water on an empty stomach. Do not lay down for at least 2 hours 4 tablet 11  . aspirin 81 MG EC tablet Take 81 mg by mouth daily.    . Calcium Carb-Cholecalciferol (CALCIUM 600 + D PO) Take 1 tablet by mouth daily.     . Cholecalciferol (VITAMIN D3) 2000 UNITS TABS Take 1 tablet by mouth daily.    . ferrous sulfate 325 (65 FE) MG EC tablet Take 325 mg by mouth daily with breakfast.    . gabapentin (NEURONTIN) 300 MG capsule TAKE 1 CAPSULE BY MOUTH  TWICE DAILY 180 capsule 0  . Garlic 940 MG TABS Take 1 tablet by mouth 2 (two) times daily.    Marland Kitchen geriatric multivitamins-minerals (ELDERTONIC/GEVRABON) ELIX Take 15 mLs by mouth daily.    Marland Kitchen glucose blood (ACCU-CHEK AVIVA PLUS) test strip TEST BLOOD SUGAR TWICE  DAILY 200 strip 3  . JANUVIA 100 MG tablet TAKE 1 TABLET BY MOUTH  DAILY 90 tablet 3  . Omega-3 Fatty Acids (FISH OIL) 1000 MG CAPS Take 2 capsules by mouth daily.    Marland Kitchen omeprazole (PRILOSEC) 20 MG capsule TAKE 1 CAPSULE BY MOUTH  DAILY 90 capsule 3  . potassium chloride SA (KLOR-CON) 20 MEQ tablet TAKE 1 TABLET BY MOUTH  DAILY 90 tablet 0  . pravastatin (PRAVACHOL) 80 MG tablet TAKE 1 TABLET BY MOUTH  DAILY 90 tablet 0  . Simethicone (GAS-X EXTRA STRENGTH) 125 MG CAPS Take 1 capsule (125 mg total) by mouth 3 (three) times daily before meals. 90 capsule 2  . triamterene-hydrochlorothiazide (MAXZIDE-25) 37.5-25 MG tablet TAKE  1 TABLET BY MOUTH  DAILY 90 tablet 0  . vitamin C (ASCORBIC ACID) 500 MG tablet Take 500 mg by mouth daily.    Marland Kitchen VITAMIN E PO Take 1 capsule by mouth daily.     No current facility-administered medications on file prior to visit.    ROS Review of Systems  Constitutional: Negative.   HENT: Negative.   Eyes: Negative for visual disturbance.  Respiratory: Negative for shortness of breath.   Cardiovascular: Negative for chest pain.   Gastrointestinal: Negative for abdominal pain.  Musculoskeletal: Negative for arthralgias.    Objective:  BP 133/68   Pulse 77   Temp 98 F (36.7 C)   Ht 5' (1.524 m)   Wt 145 lb 6.4 oz (66 kg)   SpO2 97%   BMI 28.40 kg/m   BP Readings from Last 3 Encounters:  12/26/20 133/68  09/14/20 137/69  05/29/20 109/60    Wt Readings from Last 3 Encounters:  12/26/20 145 lb 6.4 oz (66 kg)  09/14/20 145 lb 8 oz (66 kg)  05/29/20 145 lb 6.4 oz (66 kg)     Physical Exam Constitutional:      General: She is not in acute distress.    Appearance: She is well-developed.  HENT:     Head: Normocephalic and atraumatic.  Eyes:     Conjunctiva/sclera: Conjunctivae normal.     Pupils: Pupils are equal, round, and reactive to light.  Neck:     Thyroid: No thyromegaly.  Cardiovascular:     Rate and Rhythm: Normal rate and regular rhythm.     Heart sounds: Normal heart sounds. No murmur heard.   Pulmonary:     Effort: Pulmonary effort is normal. No respiratory distress.     Breath sounds: Normal breath sounds. No wheezing or rales.  Abdominal:     General: Bowel sounds are normal. There is no distension.     Palpations: Abdomen is soft.     Tenderness: There is no abdominal tenderness.  Musculoskeletal:        General: Normal range of motion.     Cervical back: Normal range of motion and neck supple.  Lymphadenopathy:     Cervical: No cervical adenopathy.  Skin:    General: Skin is warm and dry.     Findings: Lesion (6 mm raised pearlescent lesion with central scab.) present.  Neurological:     Mental Status: She is alert and oriented to person, place, and time.  Psychiatric:        Behavior: Behavior normal.        Thought Content: Thought content normal.        Judgment: Judgment normal.    Diabetic foot exam was performed with the following findings:   Normal sensation of 10g monofilament Intact posterior tibialis and dorsalis pedis pulses She has high arches and  bunions bilaterally.  However there is no skin damage.  They appear to be in excellent condition.       Assessment & Plan:   Cahterine was seen today for medical management of chronic issues.  Diagnoses and all orders for this visit:  Hyperlipidemia associated with type 2 diabetes mellitus ([place]) -     Lipid panel  Type 2 diabetes mellitus with neurological manifestations, controlled ([place]) -     Bayer DCA Hb A1c Waived -     BMP8+EGFR -     CBC with Differential/Platelet  Neoplasm of uncertain behavior of skin -     Cancel: Ambulatory  referral to Dermatology -     Ambulatory referral to Dermatology      I am having Glessie M. Hedgepath maintain her aspirin, Vitamin D3, geriatric multivitamins-minerals, Fish Oil, ferrous sulfate, Garlic, Calcium Carb-Cholecalciferol (CALCIUM 600 + D PO), vitamin C, VITAMIN E PO, Simethicone, alendronate, Januvia, omeprazole, potassium chloride SA, Accu-Chek Aviva Plus, pravastatin, gabapentin, and triamterene-hydrochlorothiazide.  No orders of the defined types were placed in this encounter.    Follow-up: Return in about 3 months (around 03/28/2021).  Claretta Fraise, M.D.

## 2020-12-27 LAB — LIPID PANEL
Chol/HDL Ratio: 4.1 ratio (ref 0.0–4.4)
Cholesterol, Total: 169 mg/dL (ref 100–199)
HDL: 41 mg/dL (ref >39)
LDL Chol Calc (NIH): 89 mg/dL (ref 0–99)
Triglycerides: 234 mg/dL — ABNORMAL HIGH (ref 0–149)
VLDL Cholesterol Cal: 39 mg/dL (ref 5–40)

## 2020-12-27 LAB — CBC WITH DIFFERENTIAL/PLATELET
Basophils Absolute: 0 10*3/uL (ref 0.0–0.2)
Basos: 1 %
EOS (ABSOLUTE): 0.3 10*3/uL (ref 0.0–0.4)
Eos: 5 %
Hematocrit: 34.4 % (ref 34.0–46.6)
Hemoglobin: 11.9 g/dL (ref 11.1–15.9)
Immature Grans (Abs): 0 10*3/uL (ref 0.0–0.1)
Immature Granulocytes: 1 %
Lymphocytes Absolute: 2.2 10*3/uL (ref 0.7–3.1)
Lymphs: 34 %
MCH: 31.9 pg (ref 26.6–33.0)
MCHC: 34.6 g/dL (ref 31.5–35.7)
MCV: 92 fL (ref 79–97)
Monocytes Absolute: 0.6 10*3/uL (ref 0.1–0.9)
Monocytes: 9 %
Neutrophils Absolute: 3.3 10*3/uL (ref 1.4–7.0)
Neutrophils: 50 %
Platelets: 319 10*3/uL (ref 150–450)
RBC: 3.73 x10E6/uL — ABNORMAL LOW (ref 3.77–5.28)
RDW: 11.6 % — ABNORMAL LOW (ref 11.7–15.4)
WBC: 6.5 10*3/uL (ref 3.4–10.8)

## 2020-12-27 LAB — BMP8+EGFR
BUN/Creatinine Ratio: 17 (ref 12–28)
BUN: 13 mg/dL (ref 8–27)
CO2: 27 mmol/L (ref 20–29)
Calcium: 10.4 mg/dL — ABNORMAL HIGH (ref 8.7–10.3)
Chloride: 96 mmol/L (ref 96–106)
Creatinine, Ser: 0.77 mg/dL (ref 0.57–1.00)
Glucose: 115 mg/dL — ABNORMAL HIGH (ref 65–99)
Potassium: 3.9 mmol/L (ref 3.5–5.2)
Sodium: 139 mmol/L (ref 134–144)
eGFR: 76 mL/min/{1.73_m2} (ref >59)

## 2020-12-31 NOTE — Progress Notes (Signed)
Hello Theresa Watson,  Your lab result is normal and/or stable.Some minor variations that are not significant are commonly marked abnormal, but do not represent any medical problem for you.  Best regards, Britanny Marksberry, M.D.

## 2021-01-30 ENCOUNTER — Ambulatory Visit (INDEPENDENT_AMBULATORY_CARE_PROVIDER_SITE_OTHER): Payer: Medicare Other | Admitting: Licensed Clinical Social Worker

## 2021-01-30 ENCOUNTER — Telehealth: Payer: Self-pay

## 2021-01-30 DIAGNOSIS — E1149 Type 2 diabetes mellitus with other diabetic neurological complication: Secondary | ICD-10-CM

## 2021-01-30 DIAGNOSIS — E1169 Type 2 diabetes mellitus with other specified complication: Secondary | ICD-10-CM | POA: Diagnosis not present

## 2021-01-30 DIAGNOSIS — I10 Essential (primary) hypertension: Secondary | ICD-10-CM | POA: Diagnosis not present

## 2021-01-30 DIAGNOSIS — I639 Cerebral infarction, unspecified: Secondary | ICD-10-CM

## 2021-01-30 DIAGNOSIS — E785 Hyperlipidemia, unspecified: Secondary | ICD-10-CM

## 2021-01-30 DIAGNOSIS — K580 Irritable bowel syndrome with diarrhea: Secondary | ICD-10-CM

## 2021-01-30 NOTE — Patient Instructions (Signed)
Visit Information  PATIENT GOALS: Goals Addressed            This Visit's Progress   . Protect My Health       Timeframe:  Short-Term Goal Priority:  Medium Progress: On Track Start Date:             01/30/21                Expected End Date:           05/01/21            Follow Up Date 03/08/21   Protect My Health (Patient) Communicate as needed with community resources for possible help in ADLs completion    Why is this important?    Screening tests can find diseases early when they are easier to treat.   Your doctor or nurse will talk with you about which tests are important for you.   Getting shots for common diseases like the flu and shingles will help prevent them.   Patient Coping Skills: Takes medications as prescribed Attends scheduled medical appointments  Patient Deficits Some challenges in completing ADLs Some pain issues Some mobility issues  Patient Goals:  Patient will call LCSW or RNCM as needed in next 30 days for CCM support Patient will attend scheduled medical appointments in next 30 days Patient will communicate regularly with her daughter in next 30 days to discuss client needs -  Follow Up Plan: LCSW to call client  on 03/08/21       Norva Riffle.Lesley Galentine MSW, LCSW Licensed Clinical Social Worker [facility] Care Management [phone]

## 2021-01-30 NOTE — Chronic Care Management (AMB) (Signed)
Chronic Care Management    Clinical Social Work Note  01/30/2021 Name: CAMELLIA POPESCU MRN: [id number] DOB: 1932/11/17  Saron M Matherly is a 87 y.o. year old female who is a primary care patient of [name], [name], MD. The CCM team was consulted to assist the patient with chronic disease management and/or care coordination needs related to: [name] .   Engaged with patient by telephone for follow up visit in response to provider referral for social work chronic care management and care coordination services.   Consent to Services:  The patient was given information about Chronic Care Management services, agreed to services, and gave verbal consent prior to initiation of services.  Please see initial visit note for detailed documentation.   Patient agreed to services and consent obtained.   Assessment: Review of patient past medical history, allergies, medications, and health status, including review of relevant consultants reports was performed today as part of a comprehensive evaluation and provision of chronic care management and care coordination services.     SDOH (Social Determinants of Health) assessments and interventions performed:  SDOH Interventions   Flowsheet Row Most Recent Value  SDOH Interventions   Depression Interventions/Treatment  --  [informed client of LCSW support and of RNCM support]       Advanced Directives Status: See [employer]yn[employer] application for related entries.  CCM Care Plan  Allergies  Allergen Reactions  . Levaquin [Levofloxacin] Swelling    tongue  . Cephalexin   . Metformin And Related Diarrhea  . Penicillins     .Did it involve swelling of the face/tongue/throat, SOB, or low BP? Unknown Did it involve sudden or severe rash/hives, skin peeling, or any reaction on the inside of your mouth or nose? Unknown Did you need to seek medical attention at a hospital or doctor's office? Unknown When did it last happen? If all above answers are  "NO", may proceed with cephalosporin use.   Marland Kitchen Lisinopril Cough    Outpatient Encounter Medications as of 01/30/2021  Medication Sig  . alendronate (FOSAMAX) 70 MG tablet Take 1 tablet (70 mg total) by mouth every 7 (seven) days. Take with a full glass of water on an empty stomach. Do not lay down for at least 2 hours  . aspirin 81 MG EC tablet Take 81 mg by mouth daily.  . Calcium Carb-Cholecalciferol (CALCIUM 600 + D PO) Take 1 tablet by mouth daily.   . Cholecalciferol (VITAMIN D3) 2000 UNITS TABS Take 1 tablet by mouth daily.  . ferrous sulfate 325 (65 FE) MG EC tablet Take 325 mg by mouth daily with breakfast.  . gabapentin (NEURONTIN) 300 MG capsule TAKE 1 CAPSULE BY MOUTH  TWICE DAILY  . Garlic 094 MG TABS Take 1 tablet by mouth 2 (two) times daily.  Marland Kitchen geriatric multivitamins-minerals (ELDERTONIC/GEVRABON) ELIX Take 15 mLs by mouth daily.  Marland Kitchen glucose blood (ACCU-CHEK AVIVA PLUS) test strip TEST BLOOD SUGAR TWICE  DAILY  . JANUVIA 100 MG tablet TAKE 1 TABLET BY MOUTH  DAILY  . Omega-3 Fatty Acids (FISH OIL) 1000 MG CAPS Take 2 capsules by mouth daily.  Marland Kitchen omeprazole (PRILOSEC) 20 MG capsule TAKE 1 CAPSULE BY MOUTH  DAILY  . potassium chloride SA (KLOR-CON) 20 MEQ tablet TAKE 1 TABLET BY MOUTH  DAILY  . pravastatin (PRAVACHOL) 80 MG tablet TAKE 1 TABLET BY MOUTH  DAILY  . Simethicone (GAS-X EXTRA STRENGTH) 125 MG CAPS Take 1 capsule (125 mg total) by mouth 3 (three) times daily before meals.  Marland Kitchen  triamterene-hydrochlorothiazide (MAXZIDE-25) 37.5-25 MG tablet TAKE 1 TABLET BY MOUTH  DAILY  . vitamin C (ASCORBIC ACID) 500 MG tablet Take 500 mg by mouth daily.  Marland Kitchen VITAMIN E PO Take 1 capsule by mouth daily.   No facility-administered encounter medications on file as of 01/30/2021.    Patient Active Problem List   Diagnosis Date Noted  . Acute ischemic stroke ([place]) 03/25/2019  . Acute metabolic encephalopathy 37/29/0211  . Hyponatremia 03/24/2019  . Diabetes mellitus due to underlying  condition with diabetic autonomic neuropathy, without long-term current use of insulin ([place]) 09/18/2015  . Annual physical exam 02/26/2015  . Type 2 diabetes mellitus with neurological manifestations, controlled ([place]) 08/29/2013  . Hypertension 11/13/2012  . Cataracts, bilateral 11/13/2012  . Symptomatic menopausal or female climacteric states   . Hyperlipidemia associated with type 2 diabetes mellitus ([place])   . Esophagitis   . 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 Obesity, mild   . IBS (irritable bowel syndrome)   . Kidney stone   . Lumbosacral spondylosis without myelopathy   . Pain in joint, shoulder region   . Colon polyp   . Diverticulosis     Conditions to be addressed/monitored: Monitor client use of community resources as needed   Care Plan : LCSW Care Plan  Updates made by Katha Cabal, LCSW since 01/30/2021 12:00 AM    Problem: Coping Skills (General Plan of Care)     Goal: Coping Skills Enhanced;Client to contact community resources as needed for ADLs support   Start Date: 01/30/2021  Expected End Date: 05/01/2021  This Visit's Progress: On track  Priority: Medium  Note:   Current barriers:   . Patient in need of assistance with connecting to community resources for possible help  with her completion of daily ADLs and daily activities . Patient is unable to independently navigate community resource options without care coordination support . Some mobility issues  Clinical Goals:  patient will work with SW monthly to address concerns related to completing her daily ADLs and other daily activities Patient will talk with LCSW in next 30 days about mobility issues and pain issues faced  Clinical Interventions:  . Collaboration with Claretta Fraise, MD regarding development and update of comprehensive plan of care as evidenced by provider attestation and co-signature . Assessment of needs, barriers of client  . Talked with client about  pain issues of client . Talked with client about family support (daughters are supportive) . Talked with client about ambulation of client (she uses a cane to help her walk) . Talked with client about medication procurement of client . Talked with client about her ADLs completion . Talked with client about possible use of community resources for daily care needs of client . Talked with client about vision of client . Talked with client about transport needs of client . Talked with client about relaxation techniques (enjoys watching TV, enjoys doing word search puzzles) . Encouraged client to call RNCM as needed for nursing support  Patient Coping Skills: Takes medications as prescribed Attends scheduled medical appointments  Patient Deficits Some challenges in completing ADLs Some pain issues Some mobility issues  Patient Goals:  Patient will call LCSW or RNCM as needed in next 30 days for CCM support Patient will attend scheduled medical appointments in next 30 days Patient will communicate regularly with her daughter in next 30 days to discuss client needs -  Follow Up Plan: LCSW to call client  on  03/08/21     Norva Riffle.Izear Pine MSW, LCSW Licensed Clinical Social Worker [facility] Care Management [phone]

## 2021-02-05 ENCOUNTER — Other Ambulatory Visit: Payer: Self-pay | Admitting: Family Medicine

## 2021-02-05 DIAGNOSIS — E1149 Type 2 diabetes mellitus with other diabetic neurological complication: Secondary | ICD-10-CM

## 2021-03-08 ENCOUNTER — Ambulatory Visit (INDEPENDENT_AMBULATORY_CARE_PROVIDER_SITE_OTHER): Payer: Medicare Other | Admitting: Licensed Clinical Social Worker

## 2021-03-08 DIAGNOSIS — I10 Essential (primary) hypertension: Secondary | ICD-10-CM

## 2021-03-08 DIAGNOSIS — E785 Hyperlipidemia, unspecified: Secondary | ICD-10-CM

## 2021-03-08 DIAGNOSIS — K580 Irritable bowel syndrome with diarrhea: Secondary | ICD-10-CM

## 2021-03-08 DIAGNOSIS — E1169 Type 2 diabetes mellitus with other specified complication: Secondary | ICD-10-CM | POA: Diagnosis not present

## 2021-03-08 DIAGNOSIS — E1149 Type 2 diabetes mellitus with other diabetic neurological complication: Secondary | ICD-10-CM | POA: Diagnosis not present

## 2021-03-08 DIAGNOSIS — I639 Cerebral infarction, unspecified: Secondary | ICD-10-CM | POA: Diagnosis not present

## 2021-03-08 NOTE — Chronic Care Management (AMB) (Signed)
Chronic Care Management    Clinical Social Work Note  03/08/2021 Name: Theresa Watson MRN: [id number] DOB: October 26, 1932  Theresa Watson is a 87 y.o. year old female who is a primary care patient of [name], [name], MD. The CCM team was consulted to assist the patient with chronic disease management and/or care coordination needs related to: [name] .   Engaged with patient/ Cindy Hazy, daughter in law,  by telephone for follow up visit in response to provider referral for social work chronic care management and care coordination services.   Consent to Services:  The patient was given information about Chronic Care Management services, agreed to services, and gave verbal consent prior to initiation of services.  Please see initial visit note for detailed documentation.   Patient agreed to services and consent obtained.   Assessment: Review of patient past medical history, allergies, medications, and health status, including review of relevant consultants reports was performed today as part of a comprehensive evaluation and provision of chronic care management and care coordination services.     SDOH (Social Determinants of Health) assessments and interventions performed:  SDOH Interventions   Flowsheet Row Most Recent Value  SDOH Interventions   Depression Interventions/Treatment  --  [informed client of LCSW support and of RNCM support]       Advanced Directives Status: See Vynca application for related entries.  CCM Care Plan  Allergies  Allergen Reactions  . Levaquin [Levofloxacin] Swelling    tongue  . Cephalexin   . Metformin And Related Diarrhea  . Penicillins     .Did it involve swelling of the face/tongue/throat, SOB, or low BP? Unknown Did it involve sudden or severe rash/hives, skin peeling, or any reaction on the inside of your mouth or nose? Unknown Did you need to seek medical attention at a hospital or doctor's office? Unknown When did it last  happen? If all above answers are "NO", may proceed with cephalosporin use.   Marland Kitchen Lisinopril Cough    Outpatient Encounter Medications as of 03/08/2021  Medication Sig  . alendronate (FOSAMAX) 70 MG tablet Take 1 tablet (70 mg total) by mouth every 7 (seven) days. Take with a full glass of water on an empty stomach. Do not lay down for at least 2 hours  . aspirin 81 MG EC tablet Take 81 mg by mouth daily.  . Calcium Carb-Cholecalciferol (CALCIUM 600 + D PO) Take 1 tablet by mouth daily.   . Cholecalciferol (VITAMIN D3) 2000 UNITS TABS Take 1 tablet by mouth daily.  . ferrous sulfate 325 (65 FE) MG EC tablet Take 325 mg by mouth daily with breakfast.  . gabapentin (NEURONTIN) 300 MG capsule TAKE 1 CAPSULE BY MOUTH  TWICE DAILY  . Garlic 720 MG TABS Take 1 tablet by mouth 2 (two) times daily.  Marland Kitchen geriatric multivitamins-minerals (ELDERTONIC/GEVRABON) ELIX Take 15 mLs by mouth daily.  Marland Kitchen glucose blood (ACCU-CHEK AVIVA PLUS) test strip TEST BLOOD SUGAR TWICE  DAILY  . JANUVIA 100 MG tablet TAKE 1 TABLET BY MOUTH  DAILY  . Omega-3 Fatty Acids (FISH OIL) 1000 MG CAPS Take 2 capsules by mouth daily.  Marland Kitchen omeprazole (PRILOSEC) 20 MG capsule TAKE 1 CAPSULE BY MOUTH  DAILY  . potassium chloride SA (KLOR-CON) 20 MEQ tablet TAKE 1 TABLET BY MOUTH  DAILY  . pravastatin (PRAVACHOL) 80 MG tablet TAKE 1 TABLET BY MOUTH  DAILY  . Simethicone (GAS-X EXTRA STRENGTH) 125 MG CAPS Take 1 capsule (125 mg total) by mouth  3 (three) times daily before meals.  . triamterene-hydrochlorothiazide (MAXZIDE-25) 37.5-25 MG tablet TAKE 1 TABLET BY MOUTH  DAILY  . vitamin C (ASCORBIC ACID) 500 MG tablet Take 500 mg by mouth daily.  Marland Kitchen VITAMIN E PO Take 1 capsule by mouth daily.   No facility-administered encounter medications on file as of 03/08/2021.    Patient Active Problem List   Diagnosis Date Noted  . Acute ischemic stroke ([place]) 03/25/2019  . Acute metabolic encephalopathy 38/18/2993  . Hyponatremia 03/24/2019  .  Diabetes mellitus due to underlying condition with diabetic autonomic neuropathy, without long-term current use of insulin ([place]) 09/18/2015  . Annual physical exam 02/26/2015  . Type 2 diabetes mellitus with neurological manifestations, controlled ([place]) 08/29/2013  . Hypertension 11/13/2012  . Cataracts, bilateral 11/13/2012  . Symptomatic menopausal or female climacteric states   . Hyperlipidemia associated with type 2 diabetes mellitus ([place])   . Esophagitis   . 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 Obesity, mild   . IBS (irritable bowel syndrome)   . Kidney stone   . Lumbosacral spondylosis without myelopathy   . Pain in joint, shoulder region   . Colon polyp   . Diverticulosis     Conditions to be addressed/monitored: Monitor client completion of daily ADLs, as able   Care Plan : LCSW Care Plan  Updates made by Katha Cabal, LCSW since 03/08/2021 12:00 AM    Problem: Coping Skills (General Plan of Care)     Goal: Coping Skills Enhanced;Client to contact community resources as needed for ADLs support   Start Date: 01/30/2021  Expected End Date: 05/01/2021  This Visit's Progress: On track  Recent Progress: On track  Priority: Medium  Note:   Current barriers:   . Patient in need of assistance with connecting to community resources for possible help  with her completion of daily ADLs and daily activities . Patient is unable to independently navigate community resource options without care coordination support . Some mobility issues  Clinical Goals:  patient will work with SW monthly to address concerns related to completing her daily ADLs and other daily activities Patient will talk with LCSW in next 30 days about mobility issues and pain issues faced  Clinical Interventions:  . Collaboration with Claretta Fraise, MD regarding development and update of comprehensive plan of care as evidenced by provider attestation and co-signature . Assessment  of needs, barriers of client  . Talked with Cindy Hazy, daughter in law of patient, regarding client needs . Talked with Pamala Hurry about pain issues of client . Talked with Pamala Hurry about client knot area on her right arm (above the wrist) . Talked with Pamala Hurry about client upcoming appointment with Dermatologist in June of 2022 . Talked with Pamala Hurry about family support for client (daughters are supportive) . Talked with Pamala Hurry about ambulation of client (she uses a cane to help her walk) . Talked with Pamala Hurry about about medication procurement of client . Talked with Pamala Hurry about client ADLs completion (daughter of client helps client  with bathing as needed) . Talked with Pamala Hurry about possible use of community resources for daily care needs of client.  LCSW spoke with Pamala Hurry about ADTS support agency for in home care . Talked with Pamala Hurry about transport needs of client . Talked with  Pamala Hurry about relaxation techniques  of client (enjoys watching TV, enjoys doing word search puzzles) . Encouraged client or daughter or Pamala Hurry to call RNCM as needed for nursing support  Patient Coping Skills: Takes medications as prescribed Attends scheduled medical appointments  Patient Deficits Some challenges in completing ADLs Some pain issues Some mobility issues  Patient Goals:  Patient will call LCSW or RNCM as needed in next 30 days for CCM support Patient will attend scheduled medical appointments in next 30 days Patient will communicate regularly with her daughter in next 30 days to discuss client needs -  Follow Up Plan: LCSW to call client  on 04/23/21    Norva Riffle.Cassadi Purdie MSW, LCSW Licensed Clinical Social Worker [facility] Care Management [phone]

## 2021-03-08 NOTE — Patient Instructions (Signed)
Visit Information  PATIENT GOALS: Goals Addressed            This Visit's Progress   . Protect My Health; Communicate as needed with community resources for possible help in ADLs completion       Timeframe:  Short-Term Goal Priority:  Medium Progress: On Track Start Date:             01/30/21                Expected End Date:           05/01/21            Follow Up Date  04/23/21   Protect My Health (Patient) Communicate as needed with community resources for possible help in ADLs completion    Why is this important?    Screening tests can find diseases early when they are easier to treat.   Your doctor or nurse will talk with you about which tests are important for you.   Getting shots for common diseases like the flu and shingles will help prevent them.   Patient Coping Skills: Takes medications as prescribed Attends scheduled medical appointments  Patient Deficits Some challenges in completing ADLs Some pain issues Some mobility issues  Patient Goals:  Patient will call LCSW or RNCM as needed in next 30 days for CCM support Patient will attend scheduled medical appointments in next 30 days Patient will communicate regularly with her daughter in next 30 days to discuss client needs -  Follow Up Plan: LCSW to call client  on 04/23/21       Norva Riffle.Saim Almanza MSW, LCSW Licensed Clinical Social Worker [facility] Care Management [phone]

## 2021-03-25 DIAGNOSIS — D485 Neoplasm of uncertain behavior of skin: Secondary | ICD-10-CM | POA: Diagnosis not present

## 2021-03-25 DIAGNOSIS — L570 Actinic keratosis: Secondary | ICD-10-CM | POA: Diagnosis not present

## 2021-03-25 DIAGNOSIS — C44319 Basal cell carcinoma of skin of other parts of face: Secondary | ICD-10-CM | POA: Diagnosis not present

## 2021-04-01 ENCOUNTER — Encounter: Payer: Self-pay | Admitting: Family Medicine

## 2021-04-01 ENCOUNTER — Ambulatory Visit (INDEPENDENT_AMBULATORY_CARE_PROVIDER_SITE_OTHER): Payer: Medicare Other | Admitting: Family Medicine

## 2021-04-01 ENCOUNTER — Other Ambulatory Visit: Payer: Self-pay

## 2021-04-01 VITALS — BP 128/63 | HR 81 | Temp 99.0°F | Ht 60.0 in | Wt 147.0 lb

## 2021-04-01 DIAGNOSIS — E1169 Type 2 diabetes mellitus with other specified complication: Secondary | ICD-10-CM

## 2021-04-01 DIAGNOSIS — M47817 Spondylosis without myelopathy or radiculopathy, lumbosacral region: Secondary | ICD-10-CM

## 2021-04-01 DIAGNOSIS — E1149 Type 2 diabetes mellitus with other diabetic neurological complication: Secondary | ICD-10-CM

## 2021-04-01 DIAGNOSIS — I10 Essential (primary) hypertension: Secondary | ICD-10-CM

## 2021-04-01 DIAGNOSIS — E785 Hyperlipidemia, unspecified: Secondary | ICD-10-CM | POA: Diagnosis not present

## 2021-04-01 DIAGNOSIS — H5203 Hypermetropia, bilateral: Secondary | ICD-10-CM | POA: Diagnosis not present

## 2021-04-01 NOTE — Progress Notes (Signed)
Subjective:  Patient ID: Theresa Watson,  female    DOB: 1933-07-03  Age: 87 y.o.    CC: Follow-up   HPI Tulsi LAURELAI LEPP presents for  follow-up of hypertension. Patient has no history of headache chest pain or shortness of breath or recent cough. Patient also denies symptoms of TIA such as numbness weakness lateralizing. Patient denies side effects from medication. States taking it regularly.  Patient also  in for follow-up of elevated cholesterol. Doing well without complaints on current medication. Denies side effects  including myalgia and arthralgia and nausea. Also in today for liver function testing. Currently no chest pain, shortness of breath or other cardiovascular related symptoms noted.  Follow-up of diabetes. Patient does check blood sugar at home. Readings run between 110-140 fasting  and 140-200 prandial Patient denies symptoms such as excessive hunger or urinary frequency, excessive hunger, nausea No significant hypoglycemic spells noted. Medications reviewed. Pt reports taking them regularly. Pt. denies complication/adverse reaction today.    History [place] has a past medical history of Colon polyp, Diabetes mellitus without complication ([place]), Diverticulosis, Esophagitis, IBS (irritable bowel syndrome), Kidney stone, Lumbosacral spondylosis without myelopathy, Obesity, mild, Other and unspecified hyperlipidemia, Pain in joint, shoulder region, Prolapse of vaginal walls without mention of uterine prolapse, Stroke ([place]), and Symptomatic menopausal or female climacteric states.   She has a past surgical history that includes Carpal tunnel release; Cholecystectomy; Vaginal hysterectomy; lumbar back surgery; and Eye surgery (Bilateral).   Her family history includes Arthritis in her sister, son, and son; Brain cancer in her mother; Cancer in her maternal grandfather, mother, and son; Colitis in her son; Diabetes in her son; GI Bleed in her father; Hyperlipidemia in her  son; Lymphoma in her sister; Pancreatic cancer in her brother; Suicidality in her paternal grandfather.She reports that she quit smoking about 9 years ago. Her smoking use included cigarettes. She started smoking about 67 years ago. She smoked an average of 0.50 packs per day. She has never used smokeless tobacco. She reports that she does not drink alcohol and does not use drugs.  Current Outpatient Medications on File Prior to Visit  Medication Sig Dispense Refill   alendronate (FOSAMAX) 70 MG tablet Take 1 tablet (70 mg total) by mouth every 7 (seven) days. Take with a full glass of water on an empty stomach. Do not lay down for at least 2 hours 4 tablet 11   aspirin 81 MG EC tablet Take 81 mg by mouth daily.     Calcium Carb-Cholecalciferol (CALCIUM 600 + D PO) Take 1 tablet by mouth daily.      Cholecalciferol (VITAMIN D3) 2000 UNITS TABS Take 1 tablet by mouth daily.     ferrous sulfate 325 (65 FE) MG EC tablet Take 325 mg by mouth daily with breakfast.     gabapentin (NEURONTIN) 300 MG capsule TAKE 1 CAPSULE BY MOUTH  TWICE DAILY 003 capsule 0   Garlic 704 MG TABS Take 1 tablet by mouth 2 (two) times daily.     geriatric multivitamins-minerals (ELDERTONIC/GEVRABON) ELIX Take 15 mLs by mouth daily.     glucose blood (ACCU-CHEK AVIVA PLUS) test strip TEST BLOOD SUGAR TWICE  DAILY 200 strip 3   JANUVIA 100 MG tablet TAKE 1 TABLET BY MOUTH  DAILY 90 tablet 3   Omega-3 Fatty Acids (FISH OIL) 1000 MG CAPS Take 2 capsules by mouth daily.     omeprazole (PRILOSEC) 20 MG capsule TAKE 1 CAPSULE BY MOUTH  DAILY 90 capsule  3   potassium chloride SA (KLOR-CON) 20 MEQ tablet TAKE 1 TABLET BY MOUTH  DAILY 90 tablet 0   pravastatin (PRAVACHOL) 80 MG tablet TAKE 1 TABLET BY MOUTH  DAILY 90 tablet 0   Simethicone (GAS-X EXTRA STRENGTH) 125 MG CAPS Take 1 capsule (125 mg total) by mouth 3 (three) times daily before meals. 90 capsule 2   triamterene-hydrochlorothiazide (MAXZIDE-25) 37.5-25 MG tablet TAKE 1  TABLET BY MOUTH  DAILY 90 tablet 0   vitamin C (ASCORBIC ACID) 500 MG tablet Take 500 mg by mouth daily.     VITAMIN E PO Take 1 capsule by mouth daily.     No current facility-administered medications on file prior to visit.    ROS Review of Systems  Constitutional: Negative.   HENT: Negative.    Eyes:  Negative for visual disturbance.  Respiratory:  Negative for shortness of breath.   Cardiovascular:  Negative for chest pain.  Gastrointestinal:  Negative for abdominal pain.  Musculoskeletal:  Positive for back pain. Negative for arthralgias.   Objective:  BP 128/63   Pulse 81   Temp 99 F (37.2 C)   Ht 5' (1.524 m)   Wt 147 lb (66.7 kg)   SpO2 97%   BMI 28.71 kg/m   BP Readings from Last 3 Encounters:  04/01/21 128/63  12/26/20 133/68  09/14/20 137/69    Wt Readings from Last 3 Encounters:  04/01/21 147 lb (66.7 kg)  12/26/20 145 lb 6.4 oz (66 kg)  09/14/20 145 lb 8 oz (66 kg)     Physical Exam Constitutional:      General: She is not in acute distress.    Appearance: She is well-developed.  HENT:     Head: Normocephalic and atraumatic.  Eyes:     Conjunctiva/sclera: Conjunctivae normal.     Pupils: Pupils are equal, round, and reactive to light.  Neck:     Thyroid: No thyromegaly.  Cardiovascular:     Rate and Rhythm: Normal rate and regular rhythm.     Heart sounds: Normal heart sounds. No murmur heard. Pulmonary:     Effort: Pulmonary effort is normal. No respiratory distress.     Breath sounds: Normal breath sounds. No wheezing or rales.  Abdominal:     General: Bowel sounds are normal. There is no distension.     Palpations: Abdomen is soft.     Tenderness: There is no abdominal tenderness.  Musculoskeletal:        General: Normal range of motion.     Cervical back: Normal range of motion and neck supple.  Lymphadenopathy:     Cervical: No cervical adenopathy.  Skin:    General: Skin is warm and dry.  Neurological:     Mental Status: She is  alert and oriented to person, place, and time.  Psychiatric:        Behavior: Behavior normal.        Thought Content: Thought content normal.        Judgment: Judgment normal.    Diabetic Foot Exam - Simple   No data filed       Assessment & Plan:   There are no diagnoses linked to this encounter. I am having Natina M. Markuson maintain her aspirin, Vitamin D3, geriatric multivitamins-minerals, Fish Oil, ferrous sulfate, Garlic, Calcium Carb-Cholecalciferol (CALCIUM 600 + D PO), vitamin C, VITAMIN E PO, Simethicone, alendronate, Januvia, omeprazole, Accu-Chek Aviva Plus, pravastatin, gabapentin, triamterene-hydrochlorothiazide, and potassium chloride SA.  No orders of the defined  types were placed in this encounter.    Follow-up: No follow-ups on file.  Claretta Fraise, M.D.

## 2021-04-02 LAB — CBC WITH DIFFERENTIAL/PLATELET
Basophils Absolute: 0 10*3/uL (ref 0.0–0.2)
Basos: 1 %
EOS (ABSOLUTE): 0.4 10*3/uL (ref 0.0–0.4)
Eos: 5 %
Hematocrit: 34 % (ref 34.0–46.6)
Hemoglobin: 12 g/dL (ref 11.1–15.9)
Immature Grans (Abs): 0 10*3/uL (ref 0.0–0.1)
Immature Granulocytes: 1 %
Lymphocytes Absolute: 2.3 10*3/uL (ref 0.7–3.1)
Lymphs: 28 %
MCH: 32.4 pg (ref 26.6–33.0)
MCHC: 35.3 g/dL (ref 31.5–35.7)
MCV: 92 fL (ref 79–97)
Monocytes Absolute: 0.6 10*3/uL (ref 0.1–0.9)
Monocytes: 8 %
Neutrophils Absolute: 5.1 10*3/uL (ref 1.4–7.0)
Neutrophils: 57 %
Platelets: 330 10*3/uL (ref 150–450)
RBC: 3.7 x10E6/uL — ABNORMAL LOW (ref 3.77–5.28)
RDW: 11.6 % — ABNORMAL LOW (ref 11.7–15.4)
WBC: 8.5 10*3/uL (ref 3.4–10.8)

## 2021-04-02 LAB — CMP14+EGFR
ALT: 22 IU/L (ref 0–32)
AST: 24 IU/L (ref 0–40)
Albumin/Globulin Ratio: 2.1 (ref 1.2–2.2)
Albumin: 4.8 g/dL — ABNORMAL HIGH (ref 3.6–4.6)
Alkaline Phosphatase: 61 IU/L (ref 44–121)
BUN/Creatinine Ratio: 19 (ref 12–28)
BUN: 14 mg/dL (ref 8–27)
Bilirubin Total: 0.3 mg/dL (ref 0.0–1.2)
CO2: 23 mmol/L (ref 20–29)
Calcium: 10.5 mg/dL — ABNORMAL HIGH (ref 8.7–10.3)
Chloride: 92 mmol/L — ABNORMAL LOW (ref 96–106)
Creatinine, Ser: 0.75 mg/dL (ref 0.57–1.00)
Globulin, Total: 2.3 g/dL (ref 1.5–4.5)
Glucose: 159 mg/dL — ABNORMAL HIGH (ref 65–99)
Potassium: 3.6 mmol/L (ref 3.5–5.2)
Sodium: 134 mmol/L (ref 134–144)
Total Protein: 7.1 g/dL (ref 6.0–8.5)
eGFR: 78 mL/min/{1.73_m2} (ref >59)

## 2021-04-02 LAB — LIPID PANEL
Chol/HDL Ratio: 4.3 ratio (ref 0.0–4.4)
Cholesterol, Total: 169 mg/dL (ref 100–199)
HDL: 39 mg/dL — ABNORMAL LOW (ref >39)
LDL Chol Calc (NIH): 77 mg/dL (ref 0–99)
Triglycerides: 328 mg/dL — ABNORMAL HIGH (ref 0–149)
VLDL Cholesterol Cal: 53 mg/dL — ABNORMAL HIGH (ref 5–40)

## 2021-04-02 LAB — BAYER DCA HB A1C WAIVED: HB A1C (BAYER DCA - WAIVED): 6.1 % (ref <7.0)

## 2021-04-05 NOTE — Progress Notes (Signed)
DAUGHTER AWARE

## 2021-04-09 DIAGNOSIS — C44319 Basal cell carcinoma of skin of other parts of face: Secondary | ICD-10-CM | POA: Diagnosis not present

## 2021-04-17 ENCOUNTER — Ambulatory Visit (INDEPENDENT_AMBULATORY_CARE_PROVIDER_SITE_OTHER): Payer: Medicare Other

## 2021-04-17 VITALS — Ht 60.0 in | Wt 147.0 lb

## 2021-04-17 DIAGNOSIS — Z0000 Encounter for general adult medical examination without abnormal findings: Secondary | ICD-10-CM

## 2021-04-17 NOTE — Patient Instructions (Signed)
Theresa Watson , Thank you for taking time to come for your Medicare Wellness Visit. I appreciate your ongoing commitment to your health goals. Please review the following plan we discussed and let me know if I can assist you in the future.   Screening recommendations/referrals: Colonoscopy: Done 08/20/2012 - Repeat not required Mammogram: Done 07/05/2018 - Repeat not required Bone Density: Done 09/21/2020 - Repeat every 2 years  Recommended yearly ophthalmology/optometry visit for glaucoma screening and checkup Recommended yearly dental visit for hygiene and checkup  Vaccinations: Influenza vaccine: Done 08/20/2020 - Repeat annually Pneumococcal vaccine: Done 03/06/2004 & 08/29/2013 Tdap vaccine: Done 07/08/2011 - Repeat in 10 years  Shingles vaccine: Zostavax done 08/07/2008; Due for Shingrix - discussed. Please contact your pharmacy for coverage information.     Covid-19: Done 10/29/19, 11/26/19, 08/10/20, & 04/13/21  Conditions/risks identified: Aim for 30 minutes of exercise or brisk walking each day, drink 6-8 glasses of water and eat lots of fruits and vegetables.   Next appointment: Follow up in one year for your annual wellness visit    Preventive Care 65 Years and Older, Female Preventive care refers to lifestyle choices and visits with your health care provider that can promote health and wellness. What does preventive care include? A yearly physical exam. This is also called an annual well check. Dental exams once or twice a year. Routine eye exams. Ask your health care provider how often you should have your eyes checked. Personal lifestyle choices, including: Daily care of your teeth and gums. Regular physical activity. Eating a healthy diet. Avoiding tobacco and drug use. Limiting alcohol use. Practicing safe sex. Taking low-dose aspirin every day. Taking vitamin and mineral supplements as recommended by your health care provider. What happens during an annual well check? The  services and screenings done by your health care provider during your annual well check will depend on your age, overall health, lifestyle risk factors, and family history of disease. Counseling  Your health care provider may ask you questions about your: Alcohol use. Tobacco use. Drug use. Emotional well-being. Home and relationship well-being. Sexual activity. Eating habits. History of falls. Memory and ability to understand (cognition). Work and work Statistician. Reproductive health. Screening  You may have the following tests or measurements: Height, weight, and BMI. Blood pressure. Lipid and cholesterol levels. These may be checked every 5 years, or more frequently if you are over 67 years old. Skin check. Lung cancer screening. You may have this screening every year starting at age 64 if you have a 30-pack-year history of smoking and currently smoke or have quit within the past 15 years. Fecal occult blood test (FOBT) of the stool. You may have this test every year starting at age 34. Flexible sigmoidoscopy or colonoscopy. You may have a sigmoidoscopy every 5 years or a colonoscopy every 10 years starting at age 63. Hepatitis C blood test. Hepatitis B blood test. Sexually transmitted disease (STD) testing. Diabetes screening. This is done by checking your blood sugar (glucose) after you have not eaten for a while (fasting). You may have this done every 1-3 years. Bone density scan. This is done to screen for osteoporosis. You may have this done starting at age 37. Mammogram. This may be done every 1-2 years. Talk to your health care provider about how often you should have regular mammograms. Talk with your health care provider about your test results, treatment options, and if necessary, the need for more tests. Vaccines  Your health care provider may recommend  certain vaccines, such as: Influenza vaccine. This is recommended every year. Tetanus, diphtheria, and acellular  pertussis (Tdap, Td) vaccine. You may need a Td booster every 10 years. Zoster vaccine. You may need this after age 89. Pneumococcal 13-valent conjugate (PCV13) vaccine. One dose is recommended after age 59. Pneumococcal polysaccharide (PPSV23) vaccine. One dose is recommended after age 90. Talk to your health care provider about which screenings and vaccines you need and how often you need them. This information is not intended to replace advice given to you by your health care provider. Make sure you discuss any questions you have with your health care provider. Document Released: 10/19/2015 Document Revised: 06/11/2016 Document Reviewed: 07/24/2015 Elsevier Interactive Patient Education  2017 [employer] Prevention in the Home Falls can cause injuries. They can happen to people of all ages. There are many things you can do to make your home safe and to help prevent falls. What can I do on the outside of my home? Regularly fix the edges of walkways and driveways and fix any cracks. Remove anything that might make you trip as you walk through a door, such as a raised step or threshold. Trim any bushes or trees on the path to your home. Use bright outdoor lighting. Clear any walking paths of anything that might make someone trip, such as rocks or tools. Regularly check to see if handrails are loose or broken. Make sure that both sides of any steps have handrails. Any raised decks and porches should have guardrails on the edges. Have any leaves, snow, or ice cleared regularly. Use sand or salt on walking paths during winter. Clean up any spills in your garage right away. This includes oil or grease spills. What can I do in the bathroom? Use night lights. Install grab bars by the toilet and in the tub and shower. Do not use towel bars as grab bars. Use non-skid mats or decals in the tub or shower. If you need to sit down in the shower, use a plastic, non-slip stool. Keep the floor  dry. Clean up any water that spills on the floor as soon as it happens. Remove soap buildup in the tub or shower regularly. Attach bath mats securely with double-sided non-slip rug tape. Do not have throw rugs and other things on the floor that can make you trip. What can I do in the bedroom? Use night lights. Make sure that you have a light by your bed that is easy to reach. Do not use any sheets or blankets that are too big for your bed. They should not hang down onto the floor. Have a firm chair that has side arms. You can use this for support while you get dressed. Do not have throw rugs and other things on the floor that can make you trip. What can I do in the kitchen? Clean up any spills right away. Avoid walking on wet floors. Keep items that you use a lot in easy-to-reach places. If you need to reach something above you, use a strong step stool that has a grab bar. Keep electrical cords out of the way. Do not use floor polish or wax that makes floors slippery. If you must use wax, use non-skid floor wax. Do not have throw rugs and other things on the floor that can make you trip. What can I do with my stairs? Do not leave any items on the stairs. Make sure that there are handrails on both sides of the  stairs and use them. Fix handrails that are broken or loose. Make sure that handrails are as long as the stairways. Check any carpeting to make sure that it is firmly attached to the stairs. Fix any carpet that is loose or worn. Avoid having throw rugs at the top or bottom of the stairs. If you do have throw rugs, attach them to the floor with carpet tape. Make sure that you have a light switch at the top of the stairs and the bottom of the stairs. If you do not have them, ask someone to add them for you. What else can I do to help prevent falls? Wear shoes that: Do not have high heels. Have rubber bottoms. Are comfortable and fit you well. Are closed at the toe. Do not wear  sandals. If you use a stepladder: Make sure that it is fully opened. Do not climb a closed stepladder. Make sure that both sides of the stepladder are locked into place. Ask someone to hold it for you, if possible. Clearly mark and make sure that you can see: Any grab bars or handrails. First and last steps. Where the edge of each step is. Use tools that help you move around (mobility aids) if they are needed. These include: Canes. Walkers. Scooters. Crutches. Turn on the lights when you go into a dark area. Replace any light bulbs as soon as they burn out. Set up your furniture so you have a clear path. Avoid moving your furniture around. If any of your floors are uneven, fix them. If there are any pets around you, be aware of where they are. Review your medicines with your doctor. Some medicines can make you feel dizzy. This can increase your chance of falling. Ask your doctor what other things that you can do to help prevent falls. This information is not intended to replace advice given to you by your health care provider. Make sure you discuss any questions you have with your health care provider. Document Released: 07/19/2009 Document Revised: 02/28/2016 Document Reviewed: 10/27/2014 Elsevier Interactive Patient Education  2017 [employer].

## 2021-04-17 NOTE — Progress Notes (Signed)
Subjective:   Theresa Watson is a 87 y.o. female who presents for Medicare Annual (Subsequent) preventive examination.  Virtual Visit via Telephone Note  I connected with  Theresa Watson on 04/17/21 at  9:45 AM EDT by telephone and verified that I am speaking with the correct person using two identifiers.  Location: Patient: Home Provider: [facility] Persons participating in the virtual visit: patient/Nurse Health Advisor   I discussed the limitations, risks, security and privacy concerns of performing an evaluation and management service by telephone and the availability of in person appointments. The patient expressed understanding and agreed to proceed.  Interactive audio and video telecommunications were attempted between this nurse and patient, however failed, due to patient having technical difficulties OR patient did not have access to video capability.  We continued and completed visit with audio only.  Some vital signs may be absent or patient reported.   Theresa Watson E Theresa Szabo, LPN   Review of Systems     Cardiac Risk Factors include: advanced age (>61men, >42 women);diabetes mellitus;dyslipidemia;hypertension;sedentary lifestyle     Objective:    Today's Vitals   04/17/21 0948  Weight: 147 lb (66.7 kg)  Height: 5' (1.524 m)   Body mass index is 28.71 kg/m.  Advanced Directives 04/11/2020 12/28/2019 04/05/2019 04/04/2019 03/31/2019 03/24/2019 03/24/2019  Does Patient Have a Medical Advance Directive? Yes No No No Yes Yes Yes  Type of Advance Directive [place];Living will Theresa Watson;Living will  Does patient want to make changes to medical advance directive? No - Patient declined - No - Patient declined No - Patient declined - No - Patient declined -  Copy of [place] in Chart? No - copy requested - No - copy requested - - No - copy requested No - copy requested  Would patient like  information on creating a medical advance directive? - No - Patient declined No - Patient declined No - Patient declined - No - Patient declined No - Patient declined    Current Medications (verified) Outpatient Encounter Medications as of 04/17/2021  Medication Sig   alendronate (FOSAMAX) 70 MG tablet Take 1 tablet (70 mg total) by mouth every 7 (seven) days. Take with a full glass of water on an empty stomach. Do not lay down for at least 2 hours   aspirin 81 MG EC tablet Take 81 mg by mouth daily.   Calcium Carb-Cholecalciferol (CALCIUM 600 + D PO) Take 1 tablet by mouth daily.    Cholecalciferol (VITAMIN D3) 2000 UNITS TABS Take 1 tablet by mouth daily.   ferrous sulfate 325 (65 FE) MG EC tablet Take 325 mg by mouth daily with breakfast.   gabapentin (NEURONTIN) 300 MG capsule TAKE 1 CAPSULE BY MOUTH  TWICE DAILY   Garlic 001 MG TABS Take 1 tablet by mouth 2 (two) times daily.   geriatric multivitamins-minerals (ELDERTONIC/GEVRABON) ELIX Take 15 mLs by mouth daily.   glucose blood (ACCU-CHEK AVIVA PLUS) test strip TEST BLOOD SUGAR TWICE  DAILY   JANUVIA 100 MG tablet TAKE 1 TABLET BY MOUTH  DAILY   Omega-3 Fatty Acids (FISH OIL) 1000 MG CAPS Take 2 capsules by mouth daily.   omeprazole (PRILOSEC) 20 MG capsule TAKE 1 CAPSULE BY MOUTH  DAILY   potassium chloride SA (KLOR-CON) 20 MEQ tablet TAKE 1 TABLET BY MOUTH  DAILY   pravastatin (PRAVACHOL) 80 MG tablet TAKE 1 TABLET BY MOUTH  DAILY  Simethicone (GAS-X EXTRA STRENGTH) 125 MG CAPS Take 1 capsule (125 mg total) by mouth 3 (three) times daily before meals.   triamterene-hydrochlorothiazide (MAXZIDE-25) 37.5-25 MG tablet TAKE 1 TABLET BY MOUTH  DAILY   vitamin C (ASCORBIC ACID) 500 MG tablet Take 500 mg by mouth daily.   VITAMIN E PO Take 1 capsule by mouth daily.   No facility-administered encounter medications on file as of 04/17/2021.    Allergies (verified) Levaquin [levofloxacin], Cephalexin, Metformin and related, Penicillins,  and Lisinopril   History: Past Medical History:  Diagnosis Date   Colon polyp    Diabetes mellitus without complication (HCC)    Diverticulosis    Esophagitis    IBS (irritable bowel syndrome)    Kidney stone    hx of 1 kidney stone   Lumbosacral spondylosis without myelopathy    Obesity, mild    Other and unspecified hyperlipidemia    Pain in joint, shoulder region    Prolapse of vaginal walls without mention of uterine prolapse    Stroke ([place])    Symptomatic menopausal or female climacteric states    Past Surgical History:  Procedure Laterality Date   CARPAL TUNNEL RELEASE     CHOLECYSTECTOMY     EYE SURGERY Bilateral    cataracts   lumbar back surgery     VAGINAL HYSTERECTOMY     partial   Family History  Problem Relation Age of Onset   Cancer Mother        brain   Brain cancer Mother    GI Bleed Father    Arthritis Sister        rheumatoid   Lymphoma Sister    Pancreatic cancer Brother    Diabetes Son    Arthritis Son        back   Cancer Son        prostate   Cancer Maternal Grandfather    Suicidality Paternal Grandfather    Arthritis Son        back   Hyperlipidemia Son    Colitis Son    Social History   Socioeconomic History   Marital status: Divorced    Spouse name: Not on file   Number of children: 5   Years of education: GED   Highest education level: GED or equivalent  Occupational History   Occupation: retired  Tobacco Use   Smoking status: Former    Packs/day: 0.50    Pack years: 0.00    Types: Cigarettes    Start date: 10/06/1953    Quit date: 02/04/2012    Years since quitting: 9.2   Smokeless tobacco: Never  Vaping Use   Vaping Use: Never used  Substance and Sexual Activity   Alcohol use: No   Drug use: No   Sexual activity: Not Currently  Other Topics Concern   Not on file  Social History Narrative   Lives alone. Daughter lives next door - checks on her daily and helps her with showers.   Son, [name] and daughter in law,  Pamala Hurry provide transportation to shopping and dr visits.   Social Determinants of Health   Financial Resource Strain: Low Risk    Difficulty of Paying Living Expenses: Not very hard  Food Insecurity: No Food Insecurity   Worried About Charity fundraiser in the Last Year: Never true   Ran Out of Food in the Last Year: Never true  Transportation Needs: No Transportation Needs   Lack of Transportation (Medical): No  Lack of Transportation (Non-Medical): No  Physical Activity: Insufficiently Active   Days of Exercise per Week: 7 days   Minutes of Exercise per Session: 10 min  Stress: No Stress Concern Present   Feeling of Stress : Not at all  Social Connections: Moderately Integrated   Frequency of Communication with Friends and Family: More than three times a week   Frequency of Social Gatherings with Friends and Family: Twice a week   Attends Religious Services: 1 to 4 times per year   Active Member of [employer] or Organizations: Yes   Attends Archivist Meetings: 1 to 4 times per year   Marital Status: Divorced    Tobacco Counseling Counseling given: Not Answered   Clinical Intake:  Pre-visit preparation completed: Yes  Pain : No/denies pain     BMI - recorded: 28.71 Nutritional Status: BMI 25 -29 Overweight Nutritional Risks: None Diabetes: No  How often do you need to have someone help you when you read instructions, pamphlets, or other written materials from your doctor or pharmacy?: 1 - Never  Nutrition Risk Assessment:  Has the patient had any N/V/D within the last 2 months?  No  Does the patient have any non-healing wounds?  No  Has the patient had any unintentional weight loss or weight gain?  No   Diabetes:  Is the patient diabetic?  Yes  If diabetic, was a CBG obtained today?  No  Did the patient bring in their glucometer from home?  No  How often do you monitor your CBG's? never.   Financial Strains and Diabetes Management:  Are you having  any financial strains with the device, your supplies or your medication? No .  Does the patient want to be seen by Chronic Care Management for management of their diabetes?  No  Would the patient like to be referred to a Nutritionist or for Diabetic Management?  No   Diabetic Exams:  Diabetic Eye Exam: Completed 02/23/2020. Overdue for diabetic eye exam. Pt has been advised about the importance in completing this exam. She has appt coming up   Diabetic Foot Exam: Completed 12/26/2020. Pt has been advised about the importance in completing this exam. Pt is scheduled for diabetic foot exam on 07/04/21.    Interpreter Needed?: No  Information entered by :: Julisa Flippo, LPN   Activities of Daily Living In your present state of health, do you have any difficulty performing the following activities: 04/17/2021  Hearing? Y  Vision? N  Difficulty concentrating or making decisions? Y  Walking or climbing stairs? Y  Dressing or bathing? Y  Comment her daughter helps her with showers  Doing errands, shopping? Y  Comment she doesn't drive - son and daughter in law drive her  Preparing Food and eating ? N  Using the Toilet? N  In the past six months, have you accidently leaked urine? N  Do you have problems with loss of bowel control? N  Managing your Medications? N  Managing your Finances? N  Housekeeping or managing your Housekeeping? Y  Comment children help - she can do light house work  Some recent data might be hidden    Patient Care Team: Claretta Fraise, MD as PCP - General (Family Medicine) Clarene Essex, MD as Consulting Physician (Gastroenterology) Allyn Kenner, MD (Dermatology) Harlen Labs, MD as Referring Physician (Optometry) [place], Delice Bison, RN as Registered Nurse [place], Norva Riffle, LCSW as [facility] (Licensed Clinical Social Worker) Leonie Man, Mamie Nick  S, MD as Consulting Physician (Neurology)  Indicate any recent Medical Services you may have  received from other than [facility] providers in the past year (date may be approximate).     Assessment:   This is a routine wellness examination for Theresa Watson.  Hearing/Vision screen Hearing Screening - Comments:: C/o mild hearing difficulties - declines hearing aids  Vision Screening - Comments:: Wears eyeglasses - up to date with annual eye exam with Dr Marin Comment in [place]  Dietary issues and exercise activities discussed: Current Exercise Habits: Home exercise routine, Type of exercise: walking, Time (Minutes): 10, Frequency (Times/Week): 7, Weekly Exercise (Minutes/Week): 70, Intensity: Mild, Exercise limited by: neurologic condition(s)   Goals Addressed             This Visit's Progress    Prevent falls   On track      Depression Screen PHQ 2/9 Scores 04/17/2021 03/08/2021 01/30/2021 12/26/2020 09/14/2020 05/29/2020 04/11/2020  PHQ - 2 Score 0 0 0 0 0 0 0  PHQ- 9 Score 3 3 3  - - - -    Fall Risk Fall Risk  04/17/2021 12/26/2020 09/14/2020 05/29/2020 04/11/2020  Falls in the past year? 0 0 0 0 0  Comment - - - - -  Number falls in past yr: 0 - - 0 -  Injury with Fall? 0 - - 0 -  Comment - - - - -  Risk for fall due to : Impaired balance/gait;Orthopedic patient;Impaired vision;Medication side effect - - Impaired balance/gait -  Follow up Falls prevention discussed;Education provided - Falls evaluation completed Falls evaluation completed -    FALL RISK PREVENTION PERTAINING TO THE HOME:  Any stairs in or around the home? Yes  If so, are there any without handrails? No  Home free of loose throw rugs in walkways, pet beds, electrical cords, etc? Yes  Adequate lighting in your home to reduce risk of falls? Yes   ASSISTIVE DEVICES UTILIZED TO PREVENT FALLS:  Life alert? No  Use of a cane, walker or w/c? Yes  Grab bars in the bathroom? Yes  Shower chair or bench in shower? Yes  Elevated toilet seat or a handicapped toilet? Yes   TIMED UP AND GO:  Was the test performed? No . Telephonic  visit.  Cognitive Function: MMSE - Mini Mental State Exam 02/03/2018 02/02/2017  Orientation to time 5 5  Orientation to Place 5 5  Registration 3 3  Attention/ Calculation 4 4  Recall 2 3  Language- name 2 objects 2 2  Language- repeat 1 1  Language- follow 3 step command 3 3  Language- read & follow direction 1 1  Write a sentence 1 1  Copy design 1 1  Total score 28 29     6CIT Screen 04/17/2021 04/11/2020 04/04/2019  What Year? 0 points 0 points 0 points  What month? 0 points 0 points 0 points  What time? 0 points 0 points 0 points  Count back from 20 0 points 0 points 0 points  Months in reverse 2 points 2 points 0 points  Repeat phrase 4 points 2 points 2 points  Total Score 6 4 2     Immunizations Immunization History  Administered Date(s) Administered   Fluad Quad(high Dose 65+) 07/06/2019   Influenza Split 06/24/2012   Influenza Whole 07/06/2008   Influenza, High Dose Seasonal PF 07/02/2016, 07/07/2017, 07/05/2018   Influenza,inj,Quad PF,6+ Mos 07/06/2013, 07/05/2014, 07/05/2015   Moderna Sars-Covid-2 Vaccination 10/29/2019, 11/26/2019, 04/13/2020, 08/10/2020   Pneumococcal Conjugate-13 08/29/2013  Pneumococcal Polysaccharide-23 03/06/2004   Td 03/06/2004, 07/08/2011   Tdap 07/06/2009, 07/08/2011   Zoster, Live 08/07/2008    TDAP status: Up to date  Flu Vaccine status: Up to date  Pneumococcal vaccine status: Up to date  Covid-19 vaccine status: Completed vaccines  Qualifies for Shingles Vaccine? Yes   Zostavax completed Yes   Shingrix Completed?: No.    Education has been provided regarding the importance of this vaccine. Patient has been advised to call insurance company to determine out of pocket expense if they have not yet received this vaccine. Advised may also receive vaccine at local pharmacy or Health Dept. Verbalized acceptance and understanding.  Screening Tests Health Maintenance  Topic Date Due   Zoster Vaccines- Shingrix (1 of 2) Never done    OPHTHALMOLOGY EXAM  12/12/2018   MAMMOGRAM  07/06/2019   COVID-19 Vaccine (5 - Booster for Moderna series) 12/08/2020   URINE MICROALBUMIN  05/29/2021   INFLUENZA VACCINE  05/06/2021   TETANUS/TDAP  07/07/2021   HEMOGLOBIN A1C  10/01/2021   FOOT EXAM  12/26/2021   DEXA SCAN  Completed   PNA vac Low Risk Adult  Completed   HPV VACCINES  Aged Out    Health Maintenance  Health Maintenance Due  Topic Date Due   Zoster Vaccines- Shingrix (1 of 2) Never done   OPHTHALMOLOGY EXAM  12/12/2018   MAMMOGRAM  07/06/2019   COVID-19 Vaccine (5 - Booster for Moderna series) 12/08/2020   URINE MICROALBUMIN  05/29/2021    Colorectal cancer screening: No longer required.   Mammogram status: No longer required due to age.  Bone Density status: Completed 09/21/2020. Results reflect: Bone density results: OSTEOPENIA. Repeat every 2 years.  Lung Cancer Screening: (Low Dose CT Chest recommended if Age 76-80 years, 30 pack-year currently smoking OR have quit w/in 15years.) does not qualify.   Additional Screening:  Hepatitis C Screening: does not qualify  Vision Screening: Recommended annual ophthalmology exams for early detection of glaucoma and other disorders of the eye. Is the patient up to date with their annual eye exam?  No  Who is the provider or what is the name of the office in which the patient attends annual eye exams? [facility] If pt is not established with a provider, would they like to be referred to a provider to establish care? No .   Dental Screening: Recommended annual dental exams for proper oral hygiene  Community Resource Referral / Chronic Care Management: CRR required this visit?  No   CCM required this visit?  No      Plan:     I have personally reviewed and noted the following in the patient's chart:   Medical and social history Use of alcohol, tobacco or illicit drugs  Current medications and supplements including opioid prescriptions.  Functional ability and  status Nutritional status Physical activity Advanced directives List of other physicians Hospitalizations, surgeries, and ER visits in previous 12 months Vitals Screenings to include cognitive, depression, and falls Referrals and appointments  In addition, I have reviewed and discussed with patient certain preventive protocols, quality metrics, and best practice recommendations. A written personalized care plan for preventive services as well as general preventive health recommendations were provided to patient.     Sandrea Hammond, LPN   11/19/863   Nurse Notes: None

## 2021-04-23 ENCOUNTER — Telehealth: Payer: Medicare Other

## 2021-05-02 ENCOUNTER — Other Ambulatory Visit: Payer: Self-pay | Admitting: Family Medicine

## 2021-05-02 DIAGNOSIS — E1149 Type 2 diabetes mellitus with other diabetic neurological complication: Secondary | ICD-10-CM

## 2021-05-31 ENCOUNTER — Telehealth: Payer: Medicare Other

## 2021-07-04 ENCOUNTER — Ambulatory Visit: Payer: Medicare Other | Admitting: Family Medicine

## 2021-07-08 ENCOUNTER — Other Ambulatory Visit: Payer: Self-pay | Admitting: Family Medicine

## 2021-07-08 DIAGNOSIS — E1149 Type 2 diabetes mellitus with other diabetic neurological complication: Secondary | ICD-10-CM

## 2021-07-10 ENCOUNTER — Ambulatory Visit (INDEPENDENT_AMBULATORY_CARE_PROVIDER_SITE_OTHER): Payer: Medicare Other | Admitting: Licensed Clinical Social Worker

## 2021-07-10 DIAGNOSIS — E1169 Type 2 diabetes mellitus with other specified complication: Secondary | ICD-10-CM

## 2021-07-10 DIAGNOSIS — I639 Cerebral infarction, unspecified: Secondary | ICD-10-CM

## 2021-07-10 DIAGNOSIS — E1149 Type 2 diabetes mellitus with other diabetic neurological complication: Secondary | ICD-10-CM

## 2021-07-10 DIAGNOSIS — I10 Essential (primary) hypertension: Secondary | ICD-10-CM

## 2021-07-10 DIAGNOSIS — E785 Hyperlipidemia, unspecified: Secondary | ICD-10-CM

## 2021-07-10 DIAGNOSIS — K580 Irritable bowel syndrome with diarrhea: Secondary | ICD-10-CM

## 2021-07-10 NOTE — Patient Instructions (Signed)
Visit Information  PATIENT GOALS:  Goals Addressed             This Visit's Progress           Protect My Health; Communicate as needed with community resources for possible help in ADLs completion       Timeframe:  Short-Term Goal Priority:  Medium Progress: On Track Start Date:            07/10/21                Expected End Date:          10/08/21            Follow Up Date  09/02/21 at 1:30 PM     Protect My Health (Patient) Communicate as needed with community resources for possible help in ADLs completion    Why is this important?   Screening tests can find diseases early when they are easier to treat.  Your doctor or nurse will talk with you about which tests are important for you.  Getting shots for common diseases like the flu and shingles will help prevent them.   Patient Coping Skills: Takes medications as prescribed Attends scheduled medical appointments  Patient Deficits Some challenges in completing ADLs Some pain issues Some mobility issues  Patient Goals:  Patient will call LCSW or RNCM as needed in next 30 days for CCM support Patient will attend scheduled medical appointments in next 30 days Patient will communicate regularly with her daughter in next 30 days to discuss client needs -  Follow Up Plan: LCSW to call client  or Mikeisha Lemonds on 09/02/21 at 1:30 PM to assess client needs      Norva Riffle.Jossilyn Benda MSW, LCSW Licensed Clinical Social Worker [facility] [phone]

## 2021-07-10 NOTE — Chronic Care Management (AMB) (Signed)
Chronic Care Management    Clinical Social Work Note  07/10/2021 Name: Theresa Watson MRN: [id number] DOB: August 20, 1933  Theresa Watson is a 87 y.o. year old female who is a primary care patient of [name], Theresa Gash, MD. The CCM team was consulted to assist the patient with chronic disease management and/or care coordination needs related to: [name] .   Engaged with patient /daughter in Sports coach, Theresa Watson, by telephone for follow up visit in response to provider referral for social work chronic care management and care coordination services.   Consent to Services:  The patient was given information about Chronic Care Management services, agreed to services, and gave verbal consent prior to initiation of services.  Please see initial visit note for detailed documentation.   Patient agreed to services and consent obtained.   Assessment: Review of patient past medical history, allergies, medications, and health status, including review of relevant consultants reports was performed today as part of a comprehensive evaluation and provision of chronic care management and care coordination services.     SDOH (Social Determinants of Health) assessments and interventions performed:  SDOH Interventions    Flowsheet Row Most Recent Value  SDOH Interventions   Physical Activity Interventions Other (Comments)  [walking challenges, uses a cane to help her walk]  Stress Interventions Provide Counseling  [client has some stress related to managing her medical conditions]  Depression Interventions/Treatment  --  [informed Theresa Watson, daughter in Sports coach, of LCSW support for client and of RNCM support for client]        Advanced Directives Status: See Vynca application for related entries.  CCM Care Plan  Allergies  Allergen Reactions   Levaquin [Levofloxacin] Swelling    tongue   Cephalexin    Metformin And Related Diarrhea   Penicillins     .Did it involve swelling of the  face/tongue/throat, SOB, or low BP? Unknown Did it involve sudden or severe rash/hives, skin peeling, or any reaction on the inside of your mouth or nose? Unknown Did you need to seek medical attention at a hospital or doctor's office? Unknown When did it last happen?       If all above answers are "NO", may proceed with cephalosporin use.    Lisinopril Cough    Outpatient Encounter Medications as of 07/10/2021  Medication Sig   alendronate (FOSAMAX) 70 MG tablet Take 1 tablet (70 mg total) by mouth every 7 (seven) days. Take with a full glass of water on an empty stomach. Do not lay down for at least 2 hours   aspirin 81 MG EC tablet Take 81 mg by mouth daily.   Calcium Carb-Cholecalciferol (CALCIUM 600 + D PO) Take 1 tablet by mouth daily.    Cholecalciferol (VITAMIN D3) 2000 UNITS TABS Take 1 tablet by mouth daily.   ferrous sulfate 325 (65 FE) MG EC tablet Take 325 mg by mouth daily with breakfast.   gabapentin (NEURONTIN) 300 MG capsule TAKE 1 CAPSULE BY MOUTH  TWICE DAILY   Garlic 469 MG TABS Take 1 tablet by mouth 2 (two) times daily.   geriatric multivitamins-minerals (ELDERTONIC/GEVRABON) ELIX Take 15 mLs by mouth daily.   glucose blood (ACCU-CHEK AVIVA PLUS) test strip TEST BLOOD SUGAR TWICE  DAILY   JANUVIA 100 MG tablet TAKE 1 TABLET BY MOUTH  DAILY   Omega-3 Fatty Acids (FISH OIL) 1000 MG CAPS Take 2 capsules by mouth daily.   omeprazole (PRILOSEC) 20 MG capsule TAKE 1 CAPSULE BY MOUTH  DAILY  potassium chloride SA (KLOR-CON) 20 MEQ tablet TAKE 1 TABLET BY MOUTH  DAILY   pravastatin (PRAVACHOL) 80 MG tablet TAKE 1 TABLET BY MOUTH  DAILY   Simethicone (GAS-X EXTRA STRENGTH) 125 MG CAPS Take 1 capsule (125 mg total) by mouth 3 (three) times daily before meals.   triamterene-hydrochlorothiazide (MAXZIDE-25) 37.5-25 MG tablet TAKE 1 TABLET BY MOUTH  DAILY   vitamin C (ASCORBIC ACID) 500 MG tablet Take 500 mg by mouth daily.   VITAMIN E PO Take 1 capsule by mouth daily.   No  facility-administered encounter medications on file as of 07/10/2021.    Patient Active Problem List   Diagnosis Date Noted   Acute ischemic stroke ([place]) 50/53/9767   Acute metabolic encephalopathy 34/19/3790   Hyponatremia 03/24/2019   Diabetes mellitus due to underlying condition with diabetic autonomic neuropathy, without long-term current use of insulin ([place]) 09/18/2015   Annual physical exam 02/26/2015   Type 2 diabetes mellitus with neurological manifestations, controlled ([place]) 08/29/2013   Hypertension 11/13/2012   Cataracts, bilateral 11/13/2012   Symptomatic menopausal or female climacteric states    Hyperlipidemia associated with type 2 diabetes mellitus (HCC)    Esophagitis    Prolapse of vaginal walls without mention of uterine prolapse    Obesity, mild    IBS (irritable bowel syndrome)    Kidney stone    Lumbosacral spondylosis without myelopathy    Pain in joint, shoulder region    Colon polyp    Diverticulosis     Conditions to be addressed/monitored: monitor [name]  Care Plan : LCSW Care Plan  Updates made by Theresa Cabal, LCSW since 07/10/2021 12:00 AM     Problem: Coping Skills (General Plan of Care)      Goal: Coping Skills Enhanced;Client to contact community resources as needed for ADLs support   Start Date: 07/10/2021  Expected End Date: 10/08/2021  This Visit's Progress: On track  Recent Progress: On track  Priority: Medium  Note:   Current barriers:   Patient in need of assistance with connecting to community resources for possible help  with her completion of daily ADLs and daily activities Patient is unable to independently navigate community resource options without care coordination support Some mobility issues  Clinical Goals:  patient will work with SW monthly to address concerns related to completing her daily ADLs and other daily activities Patient will talk with LCSW in next 30 days about mobility issues and pain  issues faced  Clinical Interventions:  Collaboration with Theresa Fraise, MD regarding development and update of comprehensive plan of care as evidenced by provider attestation and co-signature Assessment of needs, barriers of client  Discussed client needs with Theresa Watson, daughter in law of patient. Discussed client pain issue with Theresa Watson. Reviewed with Theresa Watson client's appointment schedule including her upcoming appointment with Dermatologist on July 23, 2021 Reviewed client's family support with Theresa Watson (daughters are supportive) Theresa Watson regarding ambulation of client (client uses a cane to help her walk) Discussed with Theresa Watson medication procurement of client Reviewed with Theresa Watson transport needs of client Encouraged client or daughter or Theresa Watson to call RNCM as needed for nursing support for client  Patient Coping Skills: Takes medications as prescribed Attends scheduled medical appointments  Patient Deficits Some challenges in completing ADLs Some pain issues Some mobility issues  Patient Goals:  Patient will call LCSW or RNCM as needed in next 30 days for CCM support Patient will attend scheduled medical appointments in next 30 days  Patient will communicate regularly with her daughter in next 30 days to discuss client needs -  Follow Up Plan: LCSW to call client  or Theresa Watson on 09/02/21 at 1:30 PM to assess client needs        Norva Riffle.Anthem Frazer MSW, LCSW Licensed Clinical Social Worker [facility] Care Management [phone]

## 2021-07-23 ENCOUNTER — Other Ambulatory Visit: Payer: Self-pay

## 2021-07-23 ENCOUNTER — Encounter: Payer: Self-pay | Admitting: Family Medicine

## 2021-07-23 ENCOUNTER — Ambulatory Visit (INDEPENDENT_AMBULATORY_CARE_PROVIDER_SITE_OTHER): Payer: Medicare Other | Admitting: Family Medicine

## 2021-07-23 VITALS — BP 121/67 | HR 72 | Temp 98.2°F | Resp 20 | Ht 60.0 in | Wt 150.0 lb

## 2021-07-23 DIAGNOSIS — E785 Hyperlipidemia, unspecified: Secondary | ICD-10-CM

## 2021-07-23 DIAGNOSIS — L570 Actinic keratosis: Secondary | ICD-10-CM | POA: Diagnosis not present

## 2021-07-23 DIAGNOSIS — I10 Essential (primary) hypertension: Secondary | ICD-10-CM

## 2021-07-23 DIAGNOSIS — Z85828 Personal history of other malignant neoplasm of skin: Secondary | ICD-10-CM | POA: Diagnosis not present

## 2021-07-23 DIAGNOSIS — Z23 Encounter for immunization: Secondary | ICD-10-CM | POA: Diagnosis not present

## 2021-07-23 DIAGNOSIS — E1169 Type 2 diabetes mellitus with other specified complication: Secondary | ICD-10-CM | POA: Diagnosis not present

## 2021-07-23 DIAGNOSIS — E1149 Type 2 diabetes mellitus with other diabetic neurological complication: Secondary | ICD-10-CM | POA: Diagnosis not present

## 2021-07-23 LAB — BAYER DCA HB A1C WAIVED: HB A1C (BAYER DCA - WAIVED): 6 % — ABNORMAL HIGH (ref 4.8–5.6)

## 2021-07-23 NOTE — Progress Notes (Signed)
Subjective:  Patient ID: Theresa Watson,  female    DOB: [birth year]  Age: 87 y.o.    CC: Medical Management of Chronic Issues (3 mo )   HPI Anadelia M Crocker presents for  follow-up of hypertension. Patient has no history of headache chest pain or shortness of breath or recent cough. Patient also denies symptoms of TIA such as numbness weakness lateralizing. Patient denies side effects from medication. States taking it regularly.  Patient also  in for follow-up of elevated cholesterol. Doing well without complaints on current medication. Denies side effects  including myalgia and arthralgia and nausea. Also in today for liver function testing. Currently no chest pain, shortness of breath or other cardiovascular related symptoms noted.  Follow-up of diabetes. Patient does check blood sugar at home. Readings run b around 130 fasting.  They tend to be from 1 60-1 90 but occasionally over 200 postprandial. Patient denies symptoms such as excessive hunger or urinary frequency, excessive hunger, nausea No significant hypoglycemic spells noted. Medications reviewed. Pt reports taking them regularly. Pt. denies complication/adverse reaction today.    History Zela has a past medical history of Colon polyp, Diabetes mellitus without complication ([place]), Diverticulosis, Esophagitis, IBS (irritable bowel syndrome), Kidney stone, Lumbosacral spondylosis without myelopathy, Obesity, mild, Other and unspecified hyperlipidemia, Pain in joint, shoulder region, Prolapse of vaginal walls without mention of uterine prolapse, Stroke ([place]), and Symptomatic menopausal or female climacteric states.   She has a past surgical history that includes Carpal tunnel release; Cholecystectomy; Vaginal hysterectomy; lumbar back surgery; and Eye surgery (Bilateral).   Her family history includes Arthritis in her sister, son, and son; Brain cancer in her mother; Cancer in her maternal grandfather, mother, and son;  Colitis in her son; Diabetes in her son; GI Bleed in her father; Hyperlipidemia in her son; Lymphoma in her sister; Pancreatic cancer in her brother; Suicidality in her paternal grandfather.She reports that she quit smoking about 9 years ago. Her smoking use included cigarettes. She started smoking about 67 years ago. She smoked an average of .5 packs per day. She has never used smokeless tobacco. She reports that she does not drink alcohol and does not use drugs.  Current Outpatient Medications on File Prior to Visit  Medication Sig Dispense Refill   alendronate (FOSAMAX) 70 MG tablet Take 1 tablet (70 mg total) by mouth every 7 (seven) days. Take with a full glass of water on an empty stomach. Do not lay down for at least 2 hours 4 tablet 11   aspirin 81 MG EC tablet Take 81 mg by mouth daily.     Calcium Carb-Cholecalciferol (CALCIUM 600 + D PO) Take 1 tablet by mouth daily.      Cholecalciferol (VITAMIN D3) 2000 UNITS TABS Take 1 tablet by mouth daily.     ferrous sulfate 325 (65 FE) MG EC tablet Take 325 mg by mouth daily with breakfast.     gabapentin (NEURONTIN) 300 MG capsule TAKE 1 CAPSULE BY MOUTH  TWICE DAILY 852 capsule 0   Garlic 778 MG TABS Take 1 tablet by mouth 2 (two) times daily.     geriatric multivitamins-minerals (ELDERTONIC/GEVRABON) ELIX Take 15 mLs by mouth daily.     glucose blood (ACCU-CHEK AVIVA PLUS) test strip TEST BLOOD SUGAR TWICE  DAILY 200 strip 3   JANUVIA 100 MG tablet TAKE 1 TABLET BY MOUTH  DAILY 90 tablet 3   Omega-3 Fatty Acids (FISH OIL) 1000 MG CAPS Take 2 capsules by mouth  daily.     omeprazole (PRILOSEC) 20 MG capsule TAKE 1 CAPSULE BY MOUTH  DAILY 90 capsule 3   potassium chloride SA (KLOR-CON) 20 MEQ tablet TAKE 1 TABLET BY MOUTH  DAILY 90 tablet 1   pravastatin (PRAVACHOL) 80 MG tablet TAKE 1 TABLET BY MOUTH  DAILY 90 tablet 1   Simethicone (GAS-X EXTRA STRENGTH) 125 MG CAPS Take 1 capsule (125 mg total) by mouth 3 (three) times daily before meals. 90  capsule 2   triamterene-hydrochlorothiazide (MAXZIDE-25) 37.5-25 MG tablet TAKE 1 TABLET BY MOUTH  DAILY 90 tablet 1   vitamin C (ASCORBIC ACID) 500 MG tablet Take 500 mg by mouth daily.     VITAMIN E PO Take 1 capsule by mouth daily.     No current facility-administered medications on file prior to visit.    ROS Review of Systems  Constitutional: Negative.   HENT: Negative.    Eyes:  Negative for visual disturbance.  Respiratory:  Negative for shortness of breath.   Cardiovascular:  Negative for chest pain.  Gastrointestinal:  Negative for abdominal pain.  Musculoskeletal:  Negative for arthralgias.   Objective:  BP 121/67   Pulse 72   Temp 98.2 F (36.8 C)   Resp 20   Ht 5' (1.524 m)   Wt 150 lb (68 kg)   SpO2 96%   BMI 29.29 kg/m   BP Readings from Last 3 Encounters:  07/23/21 121/67  04/01/21 128/63  12/26/20 133/68    Wt Readings from Last 3 Encounters:  07/23/21 150 lb (68 kg)  04/17/21 147 lb (66.7 kg)  04/01/21 147 lb (66.7 kg)     Physical Exam Constitutional:      General: She is not in acute distress.    Appearance: She is well-developed. She is obese.  HENT:     Head: Normocephalic and atraumatic.  Eyes:     Conjunctiva/sclera: Conjunctivae normal.     Pupils: Pupils are equal, round, and reactive to light.  Neck:     Thyroid: No thyromegaly.  Cardiovascular:     Rate and Rhythm: Normal rate and regular rhythm.     Heart sounds: Normal heart sounds. No murmur heard. Pulmonary:     Effort: Pulmonary effort is normal. No respiratory distress.     Breath sounds: Normal breath sounds. No wheezing or rales.  Abdominal:     General: Bowel sounds are normal. There is no distension.     Palpations: Abdomen is soft.     Tenderness: There is no abdominal tenderness.  Musculoskeletal:        General: Deformity (dorsal kyphosis) present. Normal range of motion.     Cervical back: Normal range of motion and neck supple.  Lymphadenopathy:      Cervical: No cervical adenopathy.  Skin:    General: Skin is warm and dry.  Neurological:     Mental Status: She is alert and oriented to person, place, and time.  Psychiatric:        Behavior: Behavior normal.        Thought Content: Thought content normal.        Judgment: Judgment normal.    A11c = 6.0     Assessment & Plan:   Fraya was seen today for medical management of chronic issues.  Diagnoses and all orders for this visit:  Hyperlipidemia associated with type 2 diabetes mellitus ([place]) -     CBC with Differential/Platelet -     Lipid panel  Type 2  diabetes mellitus with neurological manifestations, controlled ([place]) -     CBC with Differential/Platelet -     CMP14+EGFR -     Bayer DCA Hb A1c Waived -     Microalbumin / creatinine urine ratio  Primary hypertension -     CBC with Differential/Platelet -     CMP14+EGFR  Need for immunization against influenza -     Flu Vaccine QUAD High Dose(Fluad)  I am having Celinda M. Shiroma maintain her aspirin, Vitamin D3, geriatric multivitamins-minerals, Fish Oil, ferrous sulfate, Garlic, Calcium Carb-Cholecalciferol (CALCIUM 600 + D PO), vitamin C, VITAMIN E PO, Simethicone, alendronate, Januvia, omeprazole, Accu-Chek Aviva Plus, triamterene-hydrochlorothiazide, pravastatin, potassium chloride SA, and gabapentin.  No orders of the defined types were placed in this encounter.    Follow-up: No follow-ups on file.  Claretta Fraise, M.D.

## 2021-07-24 LAB — CMP14+EGFR
ALT: 19 IU/L (ref 0–32)
AST: 19 IU/L (ref 0–40)
Albumin/Globulin Ratio: 2.6 — ABNORMAL HIGH (ref 1.2–2.2)
Albumin: 4.6 g/dL (ref 3.6–4.6)
Alkaline Phosphatase: 64 IU/L (ref 44–121)
BUN/Creatinine Ratio: 20 (ref 12–28)
BUN: 17 mg/dL (ref 8–27)
Bilirubin Total: 0.3 mg/dL (ref 0.0–1.2)
CO2: 23 mmol/L (ref 20–29)
Calcium: 10 mg/dL (ref 8.7–10.3)
Chloride: 92 mmol/L — ABNORMAL LOW (ref 96–106)
Creatinine, Ser: 0.85 mg/dL (ref 0.57–1.00)
Globulin, Total: 1.8 g/dL (ref 1.5–4.5)
Glucose: 130 mg/dL — ABNORMAL HIGH (ref 70–99)
Potassium: 3.5 mmol/L (ref 3.5–5.2)
Sodium: 134 mmol/L (ref 134–144)
Total Protein: 6.4 g/dL (ref 6.0–8.5)
eGFR: 67 mL/min/{1.73_m2} (ref >59)

## 2021-07-24 LAB — CBC WITH DIFFERENTIAL/PLATELET
Basophils Absolute: 0 10*3/uL (ref 0.0–0.2)
Basos: 1 %
EOS (ABSOLUTE): 0.3 10*3/uL (ref 0.0–0.4)
Eos: 3 %
Hematocrit: 33.9 % — ABNORMAL LOW (ref 34.0–46.6)
Hemoglobin: 12 g/dL (ref 11.1–15.9)
Immature Grans (Abs): 0 10*3/uL (ref 0.0–0.1)
Immature Granulocytes: 1 %
Lymphocytes Absolute: 2.7 10*3/uL (ref 0.7–3.1)
Lymphs: 34 %
MCH: 32.3 pg (ref 26.6–33.0)
MCHC: 35.4 g/dL (ref 31.5–35.7)
MCV: 91 fL (ref 79–97)
Monocytes Absolute: 0.7 10*3/uL (ref 0.1–0.9)
Monocytes: 9 %
Neutrophils Absolute: 4.1 10*3/uL (ref 1.4–7.0)
Neutrophils: 52 %
Platelets: 335 10*3/uL (ref 150–450)
RBC: 3.72 x10E6/uL — ABNORMAL LOW (ref 3.77–5.28)
RDW: 11.5 % — ABNORMAL LOW (ref 11.7–15.4)
WBC: 7.9 10*3/uL (ref 3.4–10.8)

## 2021-07-24 LAB — LIPID PANEL
Chol/HDL Ratio: 3.7 ratio (ref 0.0–4.4)
Cholesterol, Total: 154 mg/dL (ref 100–199)
HDL: 42 mg/dL (ref >39)
LDL Chol Calc (NIH): 73 mg/dL (ref 0–99)
Triglycerides: 238 mg/dL — ABNORMAL HIGH (ref 0–149)
VLDL Cholesterol Cal: 39 mg/dL (ref 5–40)

## 2021-07-24 LAB — MICROALBUMIN / CREATININE URINE RATIO
Creatinine, Urine: 46 mg/dL
Microalb/Creat Ratio: 87 mg/g creat — ABNORMAL HIGH (ref 0–29)
Microalbumin, Urine: 40 ug/mL

## 2021-07-24 NOTE — Progress Notes (Signed)
Hello Shuna,  Your lab result is normal and/or stable.Some minor variations that are not significant are commonly marked abnormal, but do not represent any medical problem for you.  Best regards, Brieann Osinski, M.D.

## 2021-08-05 DIAGNOSIS — I639 Cerebral infarction, unspecified: Secondary | ICD-10-CM | POA: Diagnosis not present

## 2021-08-05 DIAGNOSIS — I10 Essential (primary) hypertension: Secondary | ICD-10-CM

## 2021-08-05 DIAGNOSIS — E785 Hyperlipidemia, unspecified: Secondary | ICD-10-CM | POA: Diagnosis not present

## 2021-08-05 DIAGNOSIS — E1149 Type 2 diabetes mellitus with other diabetic neurological complication: Secondary | ICD-10-CM

## 2021-08-05 DIAGNOSIS — E1169 Type 2 diabetes mellitus with other specified complication: Secondary | ICD-10-CM | POA: Diagnosis not present

## 2021-08-15 ENCOUNTER — Other Ambulatory Visit: Payer: Self-pay | Admitting: Family Medicine

## 2021-08-29 ENCOUNTER — Other Ambulatory Visit: Payer: Self-pay | Admitting: Family Medicine

## 2021-08-29 DIAGNOSIS — E1149 Type 2 diabetes mellitus with other diabetic neurological complication: Secondary | ICD-10-CM

## 2021-09-02 ENCOUNTER — Telehealth: Payer: Medicare Other

## 2021-09-12 ENCOUNTER — Other Ambulatory Visit: Payer: Self-pay | Admitting: Family Medicine

## 2021-09-17 ENCOUNTER — Ambulatory Visit: Payer: Medicare Other | Admitting: Licensed Clinical Social Worker

## 2021-09-17 DIAGNOSIS — E785 Hyperlipidemia, unspecified: Secondary | ICD-10-CM

## 2021-09-17 DIAGNOSIS — I639 Cerebral infarction, unspecified: Secondary | ICD-10-CM

## 2021-09-17 DIAGNOSIS — E1169 Type 2 diabetes mellitus with other specified complication: Secondary | ICD-10-CM

## 2021-09-17 DIAGNOSIS — K580 Irritable bowel syndrome with diarrhea: Secondary | ICD-10-CM

## 2021-09-17 DIAGNOSIS — E1149 Type 2 diabetes mellitus with other diabetic neurological complication: Secondary | ICD-10-CM

## 2021-09-17 DIAGNOSIS — I10 Essential (primary) hypertension: Secondary | ICD-10-CM

## 2021-09-17 NOTE — Patient Instructions (Addendum)
Visit Information  Patient Goals:  Protect My Health (Patient). Communicate as needed with community resources  for possible help in ADLs completion  Timeframe:  Short-Term Goal Priority:  Medium Progress: On Track Start Date:           09/17/21                Expected End Date:         12/10/21            Follow Up Date  11/12/21 at 10:00 AM     Protect My Health (Patient) Communicate as needed with community resources for possible help in ADLs completion    Why is this important?   Screening tests can find diseases early when they are easier to treat.  Your doctor or nurse will talk with you about which tests are important for you.  Getting shots for common diseases like the flu and shingles will help prevent them.   Patient Coping Skills: Takes medications as prescribed Attends scheduled medical appointments  Patient Deficits Some challenges in completing ADLs Some pain issues Some mobility issues  Patient Goals:  Patient will call LCSW or RNCM as needed in next 30 days for CCM support Patient will attend scheduled medical appointments in next 30 days Patient will communicate regularly with her daughter in next 30 days to discuss client needs -  Follow Up Plan: LCSW to call client  or Delphine Sizemore on 11/12/21 at  10:00 AM to assess client needs    Norva Riffle.Shahed Yeoman MSW, LCSW Licensed Clinical Social Worker [facility] Care Management [phone]

## 2021-09-17 NOTE — Chronic Care Management (AMB) (Signed)
Chronic Care Management    Clinical Social Work Note  09/17/2021 Name: Theresa Watson MRN: [id number] DOB: 06-03-1933  Theresa Watson is a 87 y.o. year old female who is a primary care patient of [name], [name], MD. The CCM team was consulted to assist the patient with chronic disease management and/or care coordination needs related to: [name] .   Engaged with patient by telephone for follow up visit in response to provider referral for social work chronic care management and care coordination services.   Consent to Services:  The patient was given information about Chronic Care Management services, agreed to services, and gave verbal consent prior to initiation of services.  Please see initial visit note for detailed documentation.   Patient agreed to services and consent obtained.   Assessment: Review of patient past medical history, allergies, medications, and health status, including review of relevant consultants reports was performed today as part of a comprehensive evaluation and provision of chronic care management and care coordination services.     SDOH (Social Determinants of Health) assessments and interventions performed:  SDOH Interventions    Flowsheet Row Most Recent Value  SDOH Interventions   Physical Activity Interventions Other (Comments)  [walking challenges. she uses a cane to help her walk]  Stress Interventions Provide Counseling  [client has stress related to managing medical needs]  Depression Interventions/Treatment  --  [informed client of LCSW support and of RNCM support]        Advanced Directives Status: See Vynca application for related entries.  CCM Care Plan  Allergies  Allergen Reactions   Levaquin [Levofloxacin] Swelling    tongue   Cephalexin    Metformin And Related Diarrhea   Penicillins     .Did it involve swelling of the face/tongue/throat, SOB, or low BP? Unknown Did it involve sudden or severe rash/hives, skin  peeling, or any reaction on the inside of your mouth or nose? Unknown Did you need to seek medical attention at a hospital or doctor's office? Unknown When did it last happen?       If all above answers are NO, may proceed with cephalosporin use.    Lisinopril Cough    Outpatient Encounter Medications as of 09/17/2021  Medication Sig   alendronate (FOSAMAX) 70 MG tablet TAKE 1 TABLET BY MOUTH ONCE A WEEK. TAKE WITH A FULL GLASS OF WATER ON AN EMPTY STOMACH. DO NOT LAY DOWN FOR AT LEAST 2 HOURS   aspirin 81 MG EC tablet Take 81 mg by mouth daily.   Calcium Carb-Cholecalciferol (CALCIUM 600 + D PO) Take 1 tablet by mouth daily.    Cholecalciferol (VITAMIN D3) 2000 UNITS TABS Take 1 tablet by mouth daily.   ferrous sulfate 325 (65 FE) MG EC tablet Take 325 mg by mouth daily with breakfast.   gabapentin (NEURONTIN) 300 MG capsule TAKE 1 CAPSULE BY MOUTH  TWICE DAILY   Garlic 157 MG TABS Take 1 tablet by mouth 2 (two) times daily.   geriatric multivitamins-minerals (ELDERTONIC/GEVRABON) ELIX Take 15 mLs by mouth daily.   glucose blood (ACCU-CHEK AVIVA PLUS) test strip TEST BLOOD SUGAR TWICE  DAILY Dx E11.49   JANUVIA 100 MG tablet TAKE 1 TABLET BY MOUTH  DAILY   Omega-3 Fatty Acids (FISH OIL) 1000 MG CAPS Take 2 capsules by mouth daily.   omeprazole (PRILOSEC) 20 MG capsule TAKE 1 CAPSULE BY MOUTH  DAILY   potassium chloride SA (KLOR-CON) 20 MEQ tablet TAKE 1 TABLET BY MOUTH  DAILY  pravastatin (PRAVACHOL) 80 MG tablet TAKE 1 TABLET BY MOUTH  DAILY   Simethicone (GAS-X EXTRA STRENGTH) 125 MG CAPS Take 1 capsule (125 mg total) by mouth 3 (three) times daily before meals.   triamterene-hydrochlorothiazide (MAXZIDE-25) 37.5-25 MG tablet TAKE 1 TABLET BY MOUTH  DAILY   vitamin C (ASCORBIC ACID) 500 MG tablet Take 500 mg by mouth daily.   VITAMIN E PO Take 1 capsule by mouth daily.   No facility-administered encounter medications on file as of 09/17/2021.    Patient Active Problem List    Diagnosis Date Noted   Acute ischemic stroke ([place]) 92/08/9416   Acute metabolic encephalopathy 40/81/4481   Hyponatremia 03/24/2019   Diabetes mellitus due to underlying condition with diabetic autonomic neuropathy, without long-term current use of insulin ([place]) 09/18/2015   Annual physical exam 02/26/2015   Type 2 diabetes mellitus with neurological manifestations, controlled ([place]) 08/29/2013   Hypertension 11/13/2012   Cataracts, bilateral 11/13/2012   Symptomatic menopausal or female climacteric states    Hyperlipidemia associated with type 2 diabetes mellitus (HCC)    Esophagitis    Prolapse of vaginal walls without mention of uterine prolapse    Obesity, mild    IBS (irritable bowel syndrome)    Kidney stone    Lumbosacral spondylosis without myelopathy    Pain in joint, shoulder region    Colon polyp    Diverticulosis     Conditions to be addressed/monitored: monitor [name].  Care Plan : LCSW Care Plan  Updates made by Katha Cabal, LCSW since 09/17/2021 12:00 AM     Problem: Coping Skills (General Plan of Care)      Goal: Coping Skills Enhanced;Client to contact community resources as needed for ADLs support   Start Date: 09/17/2021  Expected End Date: 12/09/2021  This Visit's Progress: On track  Recent Progress: On track  Priority: Medium  Note:   Current barriers:   Patient in need of assistance with connecting to community resources for possible help  with her completion of daily ADLs and daily activities Patient is unable to independently navigate community resource options without care coordination support Some mobility issues Decreased appetite  Clinical Goals:  patient will work with SW monthly to address concerns related to completing her daily ADLs and other daily activities Patient will talk with LCSW in next 30 days about mobility issues and pain issues faced Patient will talk with RNCM as needed in next 30 days to discuss  nursing needs of client  Clinical Interventions:  Collaboration with Claretta Fraise, MD regarding development and update of comprehensive plan of care as evidenced by provider attestation and co-signature Assessment of needs, barriers of client  Discussed client needs with Lonell Face Discussed client pain issues with Jasminne Kaster Reviewed with Asja client's appointment schedule . She said she has an appointment with Dr. Livia Snellen in January of 2023. She said she has an appointment with the Dermatologist in January of 2023 Reviewed client's family support (her daughters are supportive) Interviewed client regarding ambulation of client (client uses a cane to help her walk) Reviewed transport needs of client.  Pinky said that her son and daughter in law often take her to and from her medical appointments Encouraged client to call RNCM as needed for nursing support Discussed hearing needs of client. She said she has hearing difficulty. She has not been interested in obtaining a hearing aid  Patient Coping Skills: Takes medications as prescribed Attends scheduled medical appointments  Patient Deficits  Some challenges in completing ADLs Some pain issues Some mobility issues  Patient Goals:  Patient will call LCSW or RNCM as needed in next 30 days for CCM support Patient will attend scheduled medical appointments in next 30 days Patient will communicate regularly with her daughter in next 30 days to discuss client needs -  Follow Up Plan: LCSW to call client  or Theresa Watson , daughter in law of client, on 11/12/21 at 10:00 AM to assess client needs        Norva Riffle.Malakye Nolden MSW, LCSW Licensed Clinical Social Worker [facility] Care Management [phone]

## 2021-09-18 ENCOUNTER — Other Ambulatory Visit: Payer: Self-pay | Admitting: Family Medicine

## 2021-09-18 DIAGNOSIS — E1149 Type 2 diabetes mellitus with other diabetic neurological complication: Secondary | ICD-10-CM

## 2021-09-25 ENCOUNTER — Other Ambulatory Visit: Payer: Self-pay | Admitting: Family Medicine

## 2021-09-25 DIAGNOSIS — E1149 Type 2 diabetes mellitus with other diabetic neurological complication: Secondary | ICD-10-CM

## 2021-10-15 DIAGNOSIS — Z85828 Personal history of other malignant neoplasm of skin: Secondary | ICD-10-CM | POA: Diagnosis not present

## 2021-10-15 DIAGNOSIS — L570 Actinic keratosis: Secondary | ICD-10-CM | POA: Diagnosis not present

## 2021-10-24 ENCOUNTER — Ambulatory Visit (INDEPENDENT_AMBULATORY_CARE_PROVIDER_SITE_OTHER): Payer: Medicare Other | Admitting: Family Medicine

## 2021-10-24 ENCOUNTER — Encounter: Payer: Self-pay | Admitting: Family Medicine

## 2021-10-24 VITALS — BP 110/62 | HR 79 | Temp 97.9°F | Ht 60.0 in | Wt 147.2 lb

## 2021-10-24 DIAGNOSIS — E1149 Type 2 diabetes mellitus with other diabetic neurological complication: Secondary | ICD-10-CM | POA: Diagnosis not present

## 2021-10-24 DIAGNOSIS — I10 Essential (primary) hypertension: Secondary | ICD-10-CM | POA: Diagnosis not present

## 2021-10-24 DIAGNOSIS — E1169 Type 2 diabetes mellitus with other specified complication: Secondary | ICD-10-CM | POA: Diagnosis not present

## 2021-10-24 DIAGNOSIS — E785 Hyperlipidemia, unspecified: Secondary | ICD-10-CM | POA: Diagnosis not present

## 2021-10-24 DIAGNOSIS — Z23 Encounter for immunization: Secondary | ICD-10-CM

## 2021-10-24 LAB — BAYER DCA HB A1C WAIVED: HB A1C (BAYER DCA - WAIVED): 6.4 % — ABNORMAL HIGH (ref 4.8–5.6)

## 2021-10-24 MED ORDER — PRAVASTATIN SODIUM 80 MG PO TABS: 80.0000 mg | ORAL_TABLET | Freq: Every day | ORAL | 2 refills | Status: DC

## 2021-10-24 MED ORDER — POTASSIUM CHLORIDE CRYS ER 20 MEQ PO TBCR: 20.0000 meq | EXTENDED_RELEASE_TABLET | Freq: Every day | ORAL | 2 refills | Status: DC

## 2021-10-24 MED ORDER — ALENDRONATE SODIUM 70 MG PO TABS: ORAL_TABLET | ORAL | 3 refills | Status: DC

## 2021-10-24 MED ORDER — GABAPENTIN 300 MG PO CAPS: 300.0000 mg | ORAL_CAPSULE | Freq: Two times a day (BID) | ORAL | 2 refills | Status: DC

## 2021-10-24 MED ORDER — SITAGLIPTIN PHOSPHATE 100 MG PO TABS: 100.0000 mg | ORAL_TABLET | Freq: Every day | ORAL | 2 refills | Status: DC

## 2021-10-24 MED ORDER — TRIAMTERENE-HCTZ 37.5-25 MG PO TABS: 1.0000 | ORAL_TABLET | Freq: Every day | ORAL | 2 refills | Status: DC

## 2021-10-24 NOTE — Progress Notes (Signed)
Subjective:  Patient ID: Theresa Watson,  female    DOB: 08/10/1933  Age: 88 y.o.    CC: Medical Management of Chronic Issues   HPI Theresa Watson presents for  follow-up of hypertension. Patient has no history of headache chest pain or shortness of breath or recent cough. Patient also denies symptoms of TIA such as numbness weakness lateralizing. Patient denies side effects from medication. States taking it regularly.  Patient also  in for follow-up of elevated cholesterol. Doing well without complaints on current medication. Denies side effects  including myalgia and arthralgia and nausea. Also in today for liver function testing. Currently no chest pain, shortness of breath or other cardiovascular related symptoms noted.  Follow-up of diabetes. Patient does check blood sugar at home. Readings run between 120- 140 fasting and 150-200 prandial Patient denies symptoms such as excessive hunger or urinary frequency, excessive hunger, nausea No significant hypoglycemic spells noted. Medications reviewed. Pt reports taking them regularly. Pt. denies complication/adverse reaction today.    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   She has a past surgical history that includes Carpal tunnel release; Cholecystectomy; Vaginal hysterectomy; lumbar back surgery; and Eye surgery (Bilateral).   Her family history includes Arthritis in her sister, son, and son; Brain cancer in her mother; Cancer in her maternal grandfather, mother, and son; Colitis in her son; Diabetes in her son; GI Bleed in her  father; Hyperlipidemia in her son; Lymphoma in her sister; Pancreatic cancer in her brother; Suicidality in her paternal grandfather.She reports that she quit smoking about 9 years ago. Her smoking use included cigarettes. She started smoking about 68 years ago. She smoked an average of .5 packs per day. She has never used smokeless tobacco. She reports that she does not drink alcohol and does not use drugs.  Current Outpatient Medications on File Prior to Visit  Medication Sig Dispense Refill   aspirin 81 MG EC tablet Take 81 mg by mouth daily.     Calcium Carb-Cholecalciferol (CALCIUM 600 + D PO) Take 1 tablet by mouth daily.      Cholecalciferol (VITAMIN D3) 2000 UNITS TABS Take 1 tablet by mouth daily.     ferrous sulfate 325 (65 FE) MG EC tablet Take 325 mg by mouth daily with breakfast.     Garlic 176 MG TABS Take 1 tablet by mouth 2 (two) times daily.     geriatric multivitamins-minerals (ELDERTONIC/GEVRABON) ELIX Take 15 mLs by mouth daily.     glucose blood (ACCU-CHEK AVIVA PLUS) test strip TEST BLOOD SUGAR TWICE  DAILY Dx E11.49 200 strip 3   Omega-3 Fatty Acids (FISH OIL) 1000 MG CAPS Take 2 capsules by mouth daily.     omeprazole (PRILOSEC) 20 MG capsule TAKE 1 CAPSULE BY MOUTH  DAILY 90 capsule 3   Simethicone (GAS-X EXTRA STRENGTH) 125 MG CAPS Take 1 capsule (125 mg total) by mouth 3 (three) times daily before meals. 90 capsule 2   vitamin C (ASCORBIC ACID) 500 MG tablet Take 500 mg by mouth daily.     VITAMIN E PO Take 1 capsule by mouth daily.     No current facility-administered medications on file prior to visit.  ROS Review of Systems  Constitutional: Negative.   HENT: Negative.    Eyes:  Negative for visual disturbance.  Respiratory:  Negative for shortness of breath.   Cardiovascular:  Negative for chest pain.  Gastrointestinal:  Negative for abdominal pain.  Musculoskeletal:  Negative for arthralgias.   Objective:  BP 110/62    Pulse 79    Temp 97.9 F (36.6 C)     Ht 5' (1.524 m)    Wt 147 lb 3.2 oz (66.8 kg)    SpO2 97%    BMI 28.75 kg/m   BP Readings from Last 3 Encounters:  10/24/21 110/62  07/23/21 121/67  04/01/21 128/63    Wt Readings from Last 3 Encounters:  10/24/21 147 lb 3.2 oz (66.8 kg)  07/23/21 150 lb (68 kg)  04/17/21 147 lb (66.7 kg)     Physical Exam Constitutional:      General: She is not in acute distress.    Appearance: She is well-developed.  HENT:     Head: Normocephalic and atraumatic.  Eyes:     Conjunctiva/sclera: Conjunctivae normal.     Pupils: Pupils are equal, round, and reactive to light.  Neck:     Thyroid: No thyromegaly.  Cardiovascular:     Rate and Rhythm: Normal rate and regular rhythm.     Heart sounds: Normal heart sounds. No murmur heard. Pulmonary:     Effort: Pulmonary effort is normal. No respiratory distress.     Breath sounds: Normal breath sounds. No wheezing or rales.  Abdominal:     General: Bowel sounds are normal. There is no distension.     Palpations: Abdomen is soft.     Tenderness: There is no abdominal tenderness.  Musculoskeletal:        General: Normal range of motion.     Cervical back: Normal range of motion and neck supple.  Lymphadenopathy:     Cervical: No cervical adenopathy.  Skin:    General: Skin is warm and dry.  Neurological:     Mental Status: She is alert and oriented to person, place, and time.  Psychiatric:        Behavior: Behavior normal.        Thought Content: Thought content normal.        Judgment: Judgment normal.    Diabetic Foot Exam - Simple   No data filed       Assessment & Plan:   Monzerat was seen today for medical management of chronic issues.  Diagnoses and all orders for this visit:  Hyperlipidemia associated with type 2 diabetes mellitus ([place]) -     Lipid panel  Type 2 diabetes mellitus with neurological manifestations, controlled ([place]) -     Bayer DCA Hb A1c Waived -     gabapentin (NEURONTIN) 300 MG capsule; Take 1  capsule (300 mg total) by mouth 2 (two) times daily. -     sitaGLIPtin (JANUVIA) 100 MG tablet; Take 1 tablet (100 mg total) by mouth daily.  Primary hypertension -     CBC with Differential/Platelet -     CMP14+EGFR  Other orders -     alendronate (FOSAMAX) 70 MG tablet; TAKE 1 TABLET BY MOUTH ONCE A WEEK. TAKE WITH A FULL GLASS OF WATER ON AN EMPTY STOMACH. DO NOT LAY DOWN FOR AT LEAST 2 HOURS -     potassium chloride SA (KLOR-CON M) 20 MEQ tablet; Take 1 tablet (20 mEq total) by mouth daily. -  pravastatin (PRAVACHOL) 80 MG tablet; Take 1 tablet (80 mg total) by mouth daily. -     triamterene-hydrochlorothiazide (MAXZIDE-25) 37.5-25 MG tablet; Take 1 tablet by mouth daily.   I have changed Theresa Watson's Januvia to sitaGLIPtin. I have also changed her gabapentin, potassium chloride SA, pravastatin, and triamterene-hydrochlorothiazide. I am also having her maintain her aspirin, Vitamin D3, geriatric multivitamins-minerals, Fish Oil, ferrous sulfate, Garlic, Calcium Carb-Cholecalciferol (CALCIUM 600 + D PO), vitamin C, VITAMIN E PO, Simethicone, omeprazole, Accu-Chek Aviva Plus, and alendronate.  Meds ordered this encounter  Medications   alendronate (FOSAMAX) 70 MG tablet    Sig: TAKE 1 TABLET BY MOUTH ONCE A WEEK. TAKE WITH A FULL GLASS OF WATER ON AN EMPTY STOMACH. DO NOT LAY DOWN FOR AT LEAST 2 HOURS    Dispense:  12 tablet    Refill:  3   gabapentin (NEURONTIN) 300 MG capsule    Sig: Take 1 capsule (300 mg total) by mouth 2 (two) times daily.    Dispense:  180 capsule    Refill:  2   sitaGLIPtin (JANUVIA) 100 MG tablet    Sig: Take 1 tablet (100 mg total) by mouth daily.    Dispense:  90 tablet    Refill:  2   potassium chloride SA (KLOR-CON M) 20 MEQ tablet    Sig: Take 1 tablet (20 mEq total) by mouth daily.    Dispense:  90 tablet    Refill:  2   pravastatin (PRAVACHOL) 80 MG tablet    Sig: Take 1 tablet (80 mg total) by mouth daily.    Dispense:  90 tablet     Refill:  2   triamterene-hydrochlorothiazide (MAXZIDE-25) 37.5-25 MG tablet    Sig: Take 1 tablet by mouth daily.    Dispense:  90 tablet    Refill:  2     Follow-up: Return in about 3 months (around 01/22/2022).  Claretta Fraise, M.D.

## 2021-10-24 NOTE — Addendum Note (Signed)
Addended by: Baldomero Lamy B on: 10/24/2021 04:41 PM   Modules accepted: Orders

## 2021-10-25 LAB — LIPID PANEL
Chol/HDL Ratio: 4.3 ratio (ref 0.0–4.4)
Cholesterol, Total: 165 mg/dL (ref 100–199)
HDL: 38 mg/dL — ABNORMAL LOW (ref >39)
LDL Chol Calc (NIH): 73 mg/dL (ref 0–99)
Triglycerides: 335 mg/dL — ABNORMAL HIGH (ref 0–149)
VLDL Cholesterol Cal: 54 mg/dL — ABNORMAL HIGH (ref 5–40)

## 2021-10-25 LAB — CMP14+EGFR
ALT: 18 IU/L (ref 0–32)
AST: 17 IU/L (ref 0–40)
Albumin/Globulin Ratio: 2.2 (ref 1.2–2.2)
Albumin: 4.7 g/dL — ABNORMAL HIGH (ref 3.6–4.6)
Alkaline Phosphatase: 60 IU/L (ref 44–121)
BUN/Creatinine Ratio: 24 (ref 12–28)
BUN: 20 mg/dL (ref 8–27)
Bilirubin Total: 0.3 mg/dL (ref 0.0–1.2)
CO2: 26 mmol/L (ref 20–29)
Calcium: 10.4 mg/dL — ABNORMAL HIGH (ref 8.7–10.3)
Chloride: 95 mmol/L — ABNORMAL LOW (ref 96–106)
Creatinine, Ser: 0.83 mg/dL (ref 0.57–1.00)
Globulin, Total: 2.1 g/dL (ref 1.5–4.5)
Glucose: 160 mg/dL — ABNORMAL HIGH (ref 70–99)
Potassium: 3.6 mmol/L (ref 3.5–5.2)
Sodium: 136 mmol/L (ref 134–144)
Total Protein: 6.8 g/dL (ref 6.0–8.5)
eGFR: 69 mL/min/{1.73_m2} (ref >59)

## 2021-10-25 LAB — CBC WITH DIFFERENTIAL/PLATELET
Basophils Absolute: 0 10*3/uL (ref 0.0–0.2)
Basos: 0 %
EOS (ABSOLUTE): 0.2 10*3/uL (ref 0.0–0.4)
Eos: 3 %
Hematocrit: 35.3 % (ref 34.0–46.6)
Hemoglobin: 12.2 g/dL (ref 11.1–15.9)
Immature Grans (Abs): 0 10*3/uL (ref 0.0–0.1)
Immature Granulocytes: 1 %
Lymphocytes Absolute: 2.3 10*3/uL (ref 0.7–3.1)
Lymphs: 33 %
MCH: 32.1 pg (ref 26.6–33.0)
MCHC: 34.6 g/dL (ref 31.5–35.7)
MCV: 93 fL (ref 79–97)
Monocytes Absolute: 0.6 10*3/uL (ref 0.1–0.9)
Monocytes: 9 %
Neutrophils Absolute: 3.9 10*3/uL (ref 1.4–7.0)
Neutrophils: 54 %
Platelets: 340 10*3/uL (ref 150–450)
RBC: 3.8 x10E6/uL (ref 3.77–5.28)
RDW: 11.6 % — ABNORMAL LOW (ref 11.7–15.4)
WBC: 7 10*3/uL (ref 3.4–10.8)

## 2021-10-26 NOTE — Progress Notes (Signed)
Hello Jammy,  Your lab result is normal and/or stable.Some minor variations that are not significant are commonly marked abnormal, but do not represent any medical problem for you.  Best regards, Tyrika Newman, M.D.

## 2021-11-12 ENCOUNTER — Telehealth: Payer: Medicare Other | Admitting: Licensed Clinical Social Worker

## 2021-11-12 ENCOUNTER — Telehealth: Payer: Self-pay | Admitting: Licensed Clinical Social Worker

## 2021-11-28 ENCOUNTER — Telehealth: Payer: Self-pay

## 2021-12-02 NOTE — Chronic Care Management (AMB) (Unsigned)
°  Care Management   Note  12/02/2021 Name: Theresa Watson MRN: [id number] DOB: 12-09-1932  Theresa Watson is a 88 y.o. year old female who is a primary care patient of [name], [name], MD and is actively engaged with the care management team. I reached out to [employer] by phone today to assist with re-scheduling a follow up visit with the Licensed Clinical Social Worker  Follow up plan: Unsuccessful telephone outreach attempt made. A HIPAA compliant phone message was left for the patient providing contact information and requesting a return call.  The care management team will reach out to the patient again over the next 7 days.  If patient returns call to provider office, please advise to call Regino Ramirez  at [place], [place], [place], [place] [address] Direct Dial: [phone] [email or web address]: [email or web address]

## 2021-12-03 NOTE — Chronic Care Management (AMB) (Unsigned)
°  Care Management   Note  12/03/2021 Name: Theresa Watson MRN: [id number] DOB: Jul 08, 1933  Theresa Watson is a 88 y.o. year old female who is a primary care patient of [name], [name], MD and is actively engaged with the care management team. I reached out to [employer] by phone today to assist with re-scheduling a follow up visit with the Licensed Clinical Social Worker  Follow up plan: Unsuccessful telephone outreach attempt made. A HIPAA compliant phone message was left for the patient providing contact information and requesting a return call.  The care management team will reach out to the patient again over the next 7 days.  If patient returns call to provider office, please advise to call Arnold Line  at [place], [place], [place], [place] [address] Direct Dial: [phone] [email or web address]: [email or web address]

## 2021-12-24 NOTE — Chronic Care Management (AMB) (Signed)
?  Care Management  ? ?Note ? ?12/24/2021 ?Name: Theresa Watson MRN: [id number] DOB: 03-07-1933 ? ?Theresa Watson is a 88 y.o. year old female who is a primary care patient of [name], [name], MD and is actively engaged with the care management team. I reached out to [employer] by phone today to assist with re-scheduling a follow up visit with the Licensed Clinical Social Worker ? ?Follow up plan: ?Unable to make contact on outreach attempts x 3. PCP Claretta Fraise, MD notified via routed documentation in medical record.  ? ?Noreene Larsson, RMA ?Care Guide, Embedded Care Coordination ?  Care Management  ?[place], [place] [address] ?Direct Dial: [phone] ?[email or web address] ?[email or web address]: [email or web address]  ? ?

## 2022-01-09 ENCOUNTER — Other Ambulatory Visit: Payer: Self-pay | Admitting: Family Medicine

## 2022-01-09 DIAGNOSIS — K580 Irritable bowel syndrome with diarrhea: Secondary | ICD-10-CM

## 2022-01-21 DIAGNOSIS — L570 Actinic keratosis: Secondary | ICD-10-CM | POA: Diagnosis not present

## 2022-01-21 DIAGNOSIS — Z85828 Personal history of other malignant neoplasm of skin: Secondary | ICD-10-CM | POA: Diagnosis not present

## 2022-01-22 ENCOUNTER — Encounter: Payer: Self-pay | Admitting: Family Medicine

## 2022-01-22 ENCOUNTER — Ambulatory Visit (INDEPENDENT_AMBULATORY_CARE_PROVIDER_SITE_OTHER): Payer: Medicare Other | Admitting: Family Medicine

## 2022-01-22 VITALS — BP 121/49 | HR 74 | Temp 97.9°F | Ht 60.0 in | Wt 148.2 lb

## 2022-01-22 DIAGNOSIS — Z23 Encounter for immunization: Secondary | ICD-10-CM

## 2022-01-22 DIAGNOSIS — E785 Hyperlipidemia, unspecified: Secondary | ICD-10-CM

## 2022-01-22 DIAGNOSIS — E1169 Type 2 diabetes mellitus with other specified complication: Secondary | ICD-10-CM | POA: Diagnosis not present

## 2022-01-22 DIAGNOSIS — E1149 Type 2 diabetes mellitus with other diabetic neurological complication: Secondary | ICD-10-CM

## 2022-01-22 DIAGNOSIS — I10 Essential (primary) hypertension: Secondary | ICD-10-CM | POA: Diagnosis not present

## 2022-01-22 DIAGNOSIS — K580 Irritable bowel syndrome with diarrhea: Secondary | ICD-10-CM

## 2022-01-22 LAB — BAYER DCA HB A1C WAIVED: HB A1C (BAYER DCA - WAIVED): 6.1 % — ABNORMAL HIGH (ref 4.8–5.6)

## 2022-01-22 MED ORDER — OMEPRAZOLE 20 MG PO CPDR: 20.0000 mg | DELAYED_RELEASE_CAPSULE | Freq: Every day | ORAL | 2 refills | Status: DC

## 2022-01-22 NOTE — Progress Notes (Signed)
? ?Subjective:  ?Patient ID: Theresa Watson,  ?female    DOB: 04/23/1933  Age: 88 y.o.  ? ? ?CC: Medical Management of Chronic Issues ? ? ?HPI ?[name] presents for  follow-up of hypertension. Patient has no history of headache chest pain or shortness of breath or recent cough. Patient also denies symptoms of TIA such as numbness weakness lateralizing. Patient denies side effects from medication. States taking it regularly. ? ?Patient also  in for follow-up of elevated cholesterol. Doing well without complaints on current medication. Denies side effects  including myalgia and arthralgia and nausea. Also in today for liver function testing. Currently no chest pain, shortness of breath or other cardiovascular related symptoms noted. ? ?Follow-up of diabetes. Patient does check blood sugar at home. Readings run between 120-140 fasting and 140-170 prandial. Occasional mild outliers. None low or  high. Highest was 232 ?Patient denies symptoms such as excessive hunger or urinary frequency, excessive hunger, nausea ?No significant hypoglycemic spells noted. ?Medications reviewed. Pt reports taking them regularly. Pt. denies complication/adverse reaction today.  ? ? 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  ? ?She has a past surgical history that includes Carpal tunnel release; Cholecystectomy; Vaginal hysterectomy; lumbar back surgery; and Eye surgery (Bilateral).  ? ?Her family history includes Arthritis in her sister, son, and son; Brain cancer in her mother; Cancer in her maternal grandfather, mother, and son;  Colitis in her son; Diabetes in her son; GI Bleed in her father; Hyperlipidemia in her son; Lymphoma in her sister; Pancreatic cancer in her brother; Suicidality in her paternal grandfather.She reports that she quit smoking about 9 years ago. Her smoking use included cigarettes. She started smoking about 68 years ago. She smoked an average of .5 packs per day. She has never used smokeless tobacco. She reports that she does not drink alcohol and does not use drugs. ? ?Current Outpatient Medications on File Prior to Visit  ?Medication Sig Dispense Refill  ? alendronate (FOSAMAX) 70 MG tablet TAKE 1 TABLET BY MOUTH ONCE A WEEK. TAKE WITH A FULL GLASS OF WATER ON AN EMPTY STOMACH. DO NOT LAY DOWN FOR AT LEAST 2 HOURS 12 tablet 3  ? aspirin 81 MG EC tablet Take 81 mg by mouth daily.    ? Calcium Carb-Cholecalciferol (CALCIUM 600 + D PO) Take 1 tablet by mouth daily.     ? Cholecalciferol (VITAMIN D3) 2000 UNITS TABS Take 1 tablet by mouth daily.    ? ferrous sulfate 325 (65 FE) MG EC tablet Take 325 mg by mouth daily with breakfast.    ? gabapentin (NEURONTIN) 300 MG capsule Take 1 capsule (300 mg total) by mouth 2 (two) times daily. 180 capsule 2  ? Garlic 681 MG TABS Take 1 tablet by mouth 2 (two) times daily.    ? geriatric multivitamins-minerals (ELDERTONIC/GEVRABON) ELIX Take 15 mLs by mouth daily.    ? glucose blood (ACCU-CHEK AVIVA PLUS) test strip TEST BLOOD SUGAR TWICE  DAILY Dx E11.49 200 strip 3  ? Omega-3 Fatty Acids (FISH OIL) 1000 MG CAPS Take 2 capsules by mouth daily.    ? omeprazole (PRILOSEC) 20 MG capsule TAKE 1 CAPSULE BY MOUTH  DAILY 90 capsule  0  ? potassium chloride SA (KLOR-CON M) 20 MEQ tablet Take 1 tablet (20 mEq total) by mouth daily. 90 tablet 2  ? pravastatin (PRAVACHOL) 80 MG tablet Take 1 tablet (80 mg total) by mouth daily. 90 tablet 2  ? Simethicone (GAS-X EXTRA STRENGTH) 125 MG CAPS Take 1 capsule (125 mg total) by mouth 3 (three) times daily before meals. 90 capsule 2  ? sitaGLIPtin  (JANUVIA) 100 MG tablet Take 1 tablet (100 mg total) by mouth daily. 90 tablet 2  ? triamterene-hydrochlorothiazide (MAXZIDE-25) 37.5-25 MG tablet Take 1 tablet by mouth daily. 90 tablet 2  ? vitamin C (ASCORBIC ACID) 500 MG tablet Take 500 mg by mouth daily.    ? VITAMIN E PO Take 1 capsule by mouth daily.    ? ?No current facility-administered medications on file prior to visit.  ? ? ?ROS ?Review of Systems  ?Constitutional: Negative.   ?HENT: Negative.    ?Eyes:  Negative for visual disturbance.  ?Respiratory:  Negative for shortness of breath.   ?Cardiovascular:  Negative for chest pain.  ?Gastrointestinal:  Negative for abdominal pain.  ?Musculoskeletal:  Negative for arthralgias.  ? ?Objective:  ?BP (!) 121/49   Pulse 74   Temp 97.9 ?F (36.6 ?C)   Ht 5' (1.524 m)   Wt 148 lb 3.2 oz (67.2 kg)   SpO2 95%   BMI 28.94 kg/m?  ? ?BP Readings from Last 3 Encounters:  ?01/22/22 (!) 121/49  ?10/24/21 110/62  ?07/23/21 121/67  ? ? ?Wt Readings from Last 3 Encounters:  ?01/22/22 148 lb 3.2 oz (67.2 kg)  ?10/24/21 147 lb 3.2 oz (66.8 kg)  ?07/23/21 150 lb (68 kg)  ? ? ? ?Physical Exam ?Constitutional:   ?   General: She is not in acute distress. ?   Appearance: She is well-developed.  ?Cardiovascular:  ?   Rate and Rhythm: Normal rate and regular rhythm.  ?Pulmonary:  ?   Breath sounds: Normal breath sounds.  ?Musculoskeletal:     ?   General: Normal range of motion.  ?Skin: ?   General: Skin is warm and dry.  ?Neurological:  ?   Mental Status: She is alert and oriented to person, place, and time.  ? ? ?Diabetic Foot Exam - Simple   ?No data filed ?  ? ? ? ? ?Assessment & Plan:  ? ?Adaira was seen today for medical management of chronic issues. ? ?Diagnoses and all orders for this visit: ? ?Hyperlipidemia associated with type 2 diabetes mellitus ([place]) ?-     Lipid panel ? ?Type 2 diabetes mellitus with neurological manifestations, controlled ([place]) ?-     Bayer DCA Hb A1c Waived ? ?Primary hypertension ?-     CBC  with Differential/Platelet ?-     CMP14+EGFR ? ?Irritable bowel syndrome with diarrhea ? ? ?I am having Alize M. Gaudin maintain her aspirin, Vitamin D3, geriatric multivitamins-minerals, Fish Oil, ferrous sulfate, Garlic, Calcium Carb-Cholecalciferol (CALCIUM 600 + D PO), vitamin C, VITAMIN E PO, Simethicone, Accu-Chek Aviva Plus, alendronate, gabapentin, sitaGLIPtin, potassium chloride SA, pravastatin, triamterene-hydrochlorothiazide, and omeprazole. ? ?No orders of the defined types were placed in this encounter. ? ? ? ?Follow-up: Return in about 3 months (around 04/23/2022). ? ?Claretta Fraise, M.D. ?

## 2022-01-22 NOTE — Addendum Note (Signed)
Addended by: Baldomero Lamy B on: 01/22/2022 04:27 PM ? ? Modules accepted: Orders ? ?

## 2022-01-23 LAB — CMP14+EGFR
ALT: 20 IU/L (ref 0–32)
AST: 19 IU/L (ref 0–40)
Albumin/Globulin Ratio: 2.6 — ABNORMAL HIGH (ref 1.2–2.2)
Albumin: 4.6 g/dL (ref 3.6–4.6)
Alkaline Phosphatase: 62 IU/L (ref 44–121)
BUN/Creatinine Ratio: 17 (ref 12–28)
BUN: 14 mg/dL (ref 8–27)
Bilirubin Total: 0.4 mg/dL (ref 0.0–1.2)
CO2: 25 mmol/L (ref 20–29)
Calcium: 10.6 mg/dL — ABNORMAL HIGH (ref 8.7–10.3)
Chloride: 92 mmol/L — ABNORMAL LOW (ref 96–106)
Creatinine, Ser: 0.84 mg/dL (ref 0.57–1.00)
Globulin, Total: 1.8 g/dL (ref 1.5–4.5)
Glucose: 152 mg/dL — ABNORMAL HIGH (ref 70–99)
Potassium: 3.7 mmol/L (ref 3.5–5.2)
Sodium: 132 mmol/L — ABNORMAL LOW (ref 134–144)
Total Protein: 6.4 g/dL (ref 6.0–8.5)
eGFR: 68 mL/min/{1.73_m2} (ref >59)

## 2022-01-23 LAB — CBC WITH DIFFERENTIAL/PLATELET
Basophils Absolute: 0 10*3/uL (ref 0.0–0.2)
Basos: 1 %
EOS (ABSOLUTE): 0.2 10*3/uL (ref 0.0–0.4)
Eos: 3 %
Hematocrit: 33.7 % — ABNORMAL LOW (ref 34.0–46.6)
Hemoglobin: 12 g/dL (ref 11.1–15.9)
Immature Grans (Abs): 0 10*3/uL (ref 0.0–0.1)
Immature Granulocytes: 0 %
Lymphocytes Absolute: 2.2 10*3/uL (ref 0.7–3.1)
Lymphs: 34 %
MCH: 32.7 pg (ref 26.6–33.0)
MCHC: 35.6 g/dL (ref 31.5–35.7)
MCV: 92 fL (ref 79–97)
Monocytes Absolute: 0.5 10*3/uL (ref 0.1–0.9)
Monocytes: 8 %
Neutrophils Absolute: 3.4 10*3/uL (ref 1.4–7.0)
Neutrophils: 54 %
Platelets: 320 10*3/uL (ref 150–450)
RBC: 3.67 x10E6/uL — ABNORMAL LOW (ref 3.77–5.28)
RDW: 11.8 % (ref 11.7–15.4)
WBC: 6.3 10*3/uL (ref 3.4–10.8)

## 2022-01-23 LAB — LIPID PANEL
Chol/HDL Ratio: 3.9 ratio (ref 0.0–4.4)
Cholesterol, Total: 137 mg/dL (ref 100–199)
HDL: 35 mg/dL — ABNORMAL LOW (ref >39)
LDL Chol Calc (NIH): 49 mg/dL (ref 0–99)
Triglycerides: 354 mg/dL — ABNORMAL HIGH (ref 0–149)
VLDL Cholesterol Cal: 53 mg/dL — ABNORMAL HIGH (ref 5–40)

## 2022-02-01 ENCOUNTER — Ambulatory Visit (INDEPENDENT_AMBULATORY_CARE_PROVIDER_SITE_OTHER): Payer: Medicare Other

## 2022-02-01 ENCOUNTER — Ambulatory Visit
Admission: EM | Admit: 2022-02-01 | Discharge: 2022-02-01 | Disposition: A | Discharge status: Home / Self Care | Payer: Medicare Other | Attending: Nurse Practitioner | Admitting: Nurse Practitioner

## 2022-02-01 DIAGNOSIS — S40011A Contusion of right shoulder, initial encounter: Secondary | ICD-10-CM | POA: Diagnosis not present

## 2022-02-01 DIAGNOSIS — M25511 Pain in right shoulder: Secondary | ICD-10-CM | POA: Diagnosis not present

## 2022-02-01 NOTE — ED Triage Notes (Signed)
Pt reports she fell 2 days ago and injure right shoulder and right cheek bone. Pt reports she cannot raise the right ram.  ?

## 2022-02-01 NOTE — Discharge Instructions (Addendum)
The x-rays are negative for fracture or dislocation.  There are degenerative changes in the area in which her mother is having tenderness in her shoulder. ?Shoulder sling has been provided for stabilization.  Recommend removing the sling for 1 hour, rotating the shoulder, and then putting the sling back on.  This will help prevent immobility of the shoulder joint. ?Apply ice to the right shoulder, apply for 20 minutes, remove for 1 hour, then repeat.  Do this is much as possible for the next 48 hours.  This should help with pain and swelling. ?Stop ibuprofen as this may be hard on her kidneys.  Recommend Tylenol extra strength 500 mg 1 tablet every 8 hours as needed for pain.  May take 2 hours if pain is more severe.  Do not exceed 6 tablets in 24-hour timeframe. ?Follow-up with PCP as scheduled on 02/03/2022. ?Continue to monitor for symptoms.  If symptoms do not improve, consider follow-up with orthopedics.  You can talk to your primary care physician about this. ?Follow-up as needed. ? ?

## 2022-02-01 NOTE — ED Provider Notes (Signed)
[facility]    CSN: [id number] Arrival date & time: 02/01/22  1015      History   Chief Complaint Chief Complaint  Patient presents with   Fall    HPI Theresa Watson is a 88 y.o. female.   The patient is an 88 year old female brought in by her daughter after a fall 2 days ago.  Patient presents with right shoulder pain.  Patient states that she fell at home onto her right shoulder and she also hit the side of her face.  The daughter reports that she was unaware of her mother's fall until this morning when she went over and she was "favoring" the right arm.  The patient is unable to lift the right arm above the level of her shoulder.  Patient also has a healing wound to the right cheekbone.  The patient denies loss of consciousness, headache, dizziness, numbness, tingling, or radiation of pain.  She is right-hand dominant.  She has been using ibuprofen for pain.  Patient's daughter denies any previous history of right shoulder injury.  Patient takes aspirin 81 mg daily.  The patient is scheduled to see her PCP on 02/03/2022.  The history is provided by the patient.   Past Medical History:  Diagnosis Date   Colon polyp    Diabetes mellitus without complication (HCC)    Diverticulosis    Esophagitis    IBS (irritable bowel syndrome)    Kidney stone    hx of 1 kidney stone   Lumbosacral spondylosis without myelopathy    Obesity, mild    Other and unspecified hyperlipidemia    Pain in joint, shoulder region    Prolapse of vaginal walls without mention of uterine prolapse    Stroke (HCC)    Symptomatic menopausal or female climacteric states     Patient Active Problem List   Diagnosis Date Noted   Acute ischemic stroke (HCC) 03/25/2019   Acute metabolic encephalopathy 03/24/2019   Hyponatremia 03/24/2019   Diabetes mellitus due to underlying condition with diabetic autonomic neuropathy, without long-term current use of insulin (HCC) 09/18/2015   Annual physical  exam 02/26/2015   Type 2 diabetes mellitus with neurological manifestations, controlled (HCC) 08/29/2013   Hypertension 11/13/2012   Cataracts, bilateral 11/13/2012   Symptomatic menopausal or female climacteric states    Hyperlipidemia associated with type 2 diabetes mellitus (HCC)    Esophagitis    Prolapse of vaginal walls without mention of uterine prolapse    Obesity, mild    IBS (irritable bowel syndrome)    Kidney stone    Lumbosacral spondylosis without myelopathy    Pain in joint, shoulder region    Colon polyp    Diverticulosis     Past Surgical History:  Procedure Laterality Date   CARPAL TUNNEL RELEASE     CHOLECYSTECTOMY     EYE SURGERY Bilateral    cataracts   lumbar back surgery     VAGINAL HYSTERECTOMY     partial    OB History     Gravida  5   Para  5   Term  5   Preterm      AB      Living         SAB      IAB      Ectopic      Multiple      Live Births               Home Medications  Prior to Admission medications   Medication Sig Start Date End Date Taking? Authorizing Provider  alendronate (FOSAMAX) 70 MG tablet TAKE 1 TABLET BY MOUTH ONCE A WEEK. TAKE WITH A FULL GLASS OF WATER ON AN EMPTY STOMACH. DO NOT LAY DOWN FOR AT LEAST 2 HOURS 10/24/21   Mechele Claude, MD  aspirin 81 MG EC tablet Take 81 mg by mouth daily.    [provider]  Calcium Carb-Cholecalciferol (CALCIUM 600 + D PO) Take 1 tablet by mouth daily.     [provider]  Cholecalciferol (VITAMIN D3) 2000 UNITS TABS Take 1 tablet by mouth daily.    [provider]  ferrous sulfate 325 (65 FE) MG EC tablet Take 325 mg by mouth daily with breakfast.    [provider]  gabapentin (NEURONTIN) 300 MG capsule Take 1 capsule (300 mg total) by mouth 2 (two) times daily. 10/24/21   Mechele Claude, MD  Garlic 100 MG TABS Take 1 tablet by mouth 2 (two) times daily.    [provider]  geriatric multivitamins-minerals  (ELDERTONIC/GEVRABON) ELIX Take 15 mLs by mouth daily.    [provider]  glucose blood (ACCU-CHEK AVIVA PLUS) test strip TEST BLOOD SUGAR TWICE  DAILY Dx E11.49 09/12/21   Mechele Claude, MD  Omega-3 Fatty Acids (FISH OIL) 1000 MG CAPS Take 2 capsules by mouth daily.    [provider]  omeprazole (PRILOSEC) 20 MG capsule Take 1 capsule (20 mg total) by mouth daily. 01/22/22   Mechele Claude, MD  potassium chloride SA (KLOR-CON M) 20 MEQ tablet Take 1 tablet (20 mEq total) by mouth daily. 10/24/21   Mechele Claude, MD  pravastatin (PRAVACHOL) 80 MG tablet Take 1 tablet (80 mg total) by mouth daily. 10/24/21   Mechele Claude, MD  Simethicone (GAS-X EXTRA STRENGTH) 125 MG CAPS Take 1 capsule (125 mg total) by mouth 3 (three) times daily before meals. 05/29/20   Mechele Claude, MD  sitaGLIPtin (JANUVIA) 100 MG tablet Take 1 tablet (100 mg total) by mouth daily. 10/24/21   Mechele Claude, MD  triamterene-hydrochlorothiazide (MAXZIDE-25) 37.5-25 MG tablet Take 1 tablet by mouth daily. 10/24/21   Mechele Claude, MD  vitamin C (ASCORBIC ACID) 500 MG tablet Take 500 mg by mouth daily.    [provider]  VITAMIN E PO Take 1 capsule by mouth daily.    [provider]    Family History Family History  Problem Relation Age of Onset   Cancer Mother        brain   Brain cancer Mother    GI Bleed Father    Arthritis Sister        rheumatoid   Lymphoma Sister    Pancreatic cancer Brother    Diabetes Son    Arthritis Son        back   Cancer Son        prostate   Cancer Maternal Grandfather    Suicidality Paternal Grandfather    Arthritis Son        back   Hyperlipidemia Son    Colitis Son     Social History Social History   Tobacco Use   Smoking status: Former    Packs/day: 0.50    Types: Cigarettes    Start date: 10/06/1953    Quit date: 02/04/2012    Years since quitting: 10.0   Smokeless tobacco: Never  Vaping Use   Vaping Use: Never used  Substance  Use Topics   Alcohol  use: No   Drug use: No     Allergies   Levaquin [levofloxacin], Cephalexin, Metformin and related, Penicillins, and Lisinopril   Review of Systems Review of Systems  Constitutional: Negative.   Musculoskeletal:        Right shoulder pain  Skin:  Positive for wound (right cheekbone).  Psychiatric/Behavioral: Negative.      Physical Exam Triage Vital Signs ED Triage Vitals [02/01/22 1034]  Enc Vitals Group     BP 135/68     Pulse Rate 81     Resp 20     Temp 98.9 F (37.2 C)     Temp Source Oral     SpO2 92 %     Weight      Height      Head Circumference      Peak Flow      Pain Score      Pain Loc      Pain Edu?      Excl. in GC?    No data found.  Updated Vital Signs BP 135/68 (BP Location: Left Arm)   Pulse 81   Temp 98.9 F (37.2 C) (Oral)   Resp 20   SpO2 92%   Visual Acuity Right Eye Distance:   Left Eye Distance:   Bilateral Distance:    Right Eye Near:   Left Eye Near:    Bilateral Near:     Physical Exam Vitals reviewed.  Constitutional:      Appearance: Normal appearance.  Cardiovascular:     Rate and Rhythm: Normal rate and regular rhythm.  Pulmonary:     Effort: Pulmonary effort is normal.     Breath sounds: Normal breath sounds.  Musculoskeletal:     Right shoulder: Tenderness and bony tenderness present. Decreased range of motion.     Comments: AC joint tender to palpation  Neurological:     Mental Status: She is alert.     UC Treatments / Results  Labs (all labs ordered are listed, but only abnormal results are displayed) Labs Reviewed - No data to display  EKG   Radiology DG Shoulder Right  Result Date: 02/01/2022 CLINICAL DATA:  Fall.  Right shoulder pain. EXAM: RIGHT SHOULDER - 2+ VIEW COMPARISON:  None. FINDINGS: Three view study. Bones are diffusely demineralized. No evidence for an acute fracture. No shoulder separation or dislocation. Degenerative changes are noted in the glenohumeral and  AC joints. IMPRESSION: No acute bony findings. Degenerative changes in the glenohumeral and AC joints. Electronically Signed   By: Kennith Center M.D.   On: 02/01/2022 11:13    Procedures Procedures (including critical care time)  Medications Ordered in UC Medications - No data to display  Initial Impression / Assessment and Plan / UC Course  I have reviewed the triage vital signs and the nursing notes.  Pertinent labs & imaging results that were available during my care of the patient were reviewed by me and considered in my medical decision making (see chart for details).  The patient is an 88 year old female who presents with right shoulder pain after fall 2 days ago.  Patient has pain when trying to raise the right arm above the shoulder.  She has moderate tenderness to the [facility] joint.  There is no swelling, or ecchymosis at the site of injury.  She does have a small healing wound to her right cheekbone.  Her x-rays are negative for fracture or dislocation; however, they do show moderate degeneration of the glenohumeral  and AC joints.  This is where her tenderness is on exam.  Symptoms are consistent with a right shoulder contusion.  We will provide the patient with a sling for support and stabilization.  We will have her daughter start Tylenol to protect her kidneys instead of using ibuprofen.  Recommend ice to the affected area, apply for 20 minutes, remove for 1 hour, then repeat.  Do this as much as possible and as much as can be tolerated.  Advised daughter to remove sling once every hour so the patient can begin to gently move and rotate the shoulder to prevent joint and mobility.  Patient is scheduled to see her PCP on 02/03/2022.  Patient's advised to have her mother attend this appointment.  Advised that symptoms can take several weeks to heal.  If symptoms continue to worsen or do not improve, will recommend the patient be seen by orthopedics.  Follow-up as needed. Final Clinical  Impressions(s) / UC Diagnoses   Final diagnoses:  Contusion of right shoulder, initial encounter     Discharge Instructions      The x-rays are negative for fracture or dislocation.  There are degenerative changes in the area in which her mother is having tenderness in her shoulder. Shoulder sling has been provided for stabilization.  Recommend removing the sling for 1 hour, rotating the shoulder, and then putting the sling back on.  This will help prevent immobility of the shoulder joint. Apply ice to the right shoulder, apply for 20 minutes, remove for 1 hour, then repeat.  Do this is much as possible for the next 48 hours.  This should help with pain and swelling. Stop ibuprofen as this may be hard on her kidneys.  Recommend Tylenol extra strength 500 mg 1 tablet every 8 hours as needed for pain.  May take 2 hours if pain is more severe.  Do not exceed 6 tablets in 24-hour timeframe. Follow-up with PCP as scheduled on 02/03/2022. Continue to monitor for symptoms.  If symptoms do not improve, consider follow-up with orthopedics.  You can talk to your primary care physician about this. Follow-up as needed.      ED Prescriptions   None    PDMP not reviewed this encounter.   Abran Cantor, NP 02/01/22 1151

## 2022-02-03 ENCOUNTER — Encounter: Payer: Self-pay | Admitting: Family Medicine

## 2022-02-03 ENCOUNTER — Ambulatory Visit (INDEPENDENT_AMBULATORY_CARE_PROVIDER_SITE_OTHER): Payer: Medicare Other | Admitting: Family Medicine

## 2022-02-03 VITALS — BP 125/73 | HR 75 | Temp 98.0°F | Ht 60.0 in | Wt 148.6 lb

## 2022-02-03 DIAGNOSIS — M25511 Pain in right shoulder: Secondary | ICD-10-CM | POA: Diagnosis not present

## 2022-02-03 NOTE — Progress Notes (Signed)
Chief Complaint  ?Patient presents with  ? Shoulder Injury  ?  Right side s/p fall x 4 days ago  ? ? ?HPI ? ?Patient presents today for falling at home on 4/27. She bent over to pick up a piece of wood, overba ?Lanced and fell forward landing on the anterior right shoulder and the right forehaed and cheek.  Pain worsened over the first day so she went to UC on 4/29. Now using a sling and swathe. XR there reported as negative for fracture. There are degenerative changes in the joints of the right shoulder.  ?PMH: Smoking status noted ?ROS: Per HPI ? ?Objective: ?BP 125/73   Pulse 75   Temp 98 ?F (36.7 ?C)   Ht 5' (1.524 m)   Wt 148 lb 9.6 oz (67.4 kg)   SpO2 93%   BMI 29.02 kg/m?  ?Gen: NAD, alert, cooperative with exam ?HEENT: [place], minimal contusion at right forehead and right cheek. Skin not broken ?CV: RRR, good S1/S2, no murmur ?Resp: CTABL, no wheezes, non-labored ?Ext: No edema, warm. DEcreased external rotation and abduction of only 60 degrees secondary to pain at the right shoulder ?Neuro: Alert and oriented, No gross deficits ? ?Assessment and plan: ? ?1. Acute pain of right shoulder   ? ? ?Continue tylenol, rest, elevation and splint / shoulder immobilizer for two weeks. If not better will need PT. Should do ROM exercises out of brace QID ? ?No orders of the defined types were placed in this encounter. ? ? ?Follow up as needed - two weeks if not better ? ?Claretta Fraise, MD ? ? ? ? ?

## 2022-03-06 ENCOUNTER — Other Ambulatory Visit: Payer: Self-pay | Admitting: Family Medicine

## 2022-03-06 DIAGNOSIS — K580 Irritable bowel syndrome with diarrhea: Secondary | ICD-10-CM

## 2022-03-08 ENCOUNTER — Other Ambulatory Visit: Payer: Self-pay | Admitting: Family Medicine

## 2022-03-08 DIAGNOSIS — K580 Irritable bowel syndrome with diarrhea: Secondary | ICD-10-CM

## 2022-04-18 ENCOUNTER — Ambulatory Visit (INDEPENDENT_AMBULATORY_CARE_PROVIDER_SITE_OTHER): Payer: Medicare Other

## 2022-04-18 VITALS — Wt 148.0 lb

## 2022-04-18 DIAGNOSIS — Z0000 Encounter for general adult medical examination without abnormal findings: Secondary | ICD-10-CM | POA: Diagnosis not present

## 2022-04-18 NOTE — Progress Notes (Signed)
Subjective:   Theresa Watson is a 88 y.o. female who presents for Medicare Annual (Subsequent) preventive examination.  Virtual Visit via Telephone Note  I connected with  Theresa Watson on 04/18/22 at  9:45 AM EDT by telephone and verified that I am speaking with the correct person using two identifiers.  Location: Patient: Home Provider: [facility] Persons participating in the virtual visit: patient/Nurse Health Advisor   I discussed the limitations, risks, security and privacy concerns of performing an evaluation and management service by telephone and the availability of in person appointments. The patient expressed understanding and agreed to proceed.  Interactive audio and video telecommunications were attempted between this nurse and patient, however failed, due to patient having technical difficulties OR patient did not have access to video capability.  We continued and completed visit with audio only.  Some vital signs may be absent or patient reported.   Theresa Griffing E Mila Pair, LPN   Review of Systems     Cardiac Risk Factors include: advanced age (>75mn, >>18women);hypertension;dyslipidemia;diabetes mellitus;Other (see comment), Risk factor comments: hx of stroke     Objective:    Today's Vitals   04/18/22 0947  Weight: 148 lb (67.1 kg)  PainSc: 3    Body mass index is 28.9 kg/m.     04/18/2022    9:54 AM 04/11/2020    9:52 AM 12/28/2019    3:11 PM 04/05/2019    5:47 PM 04/04/2019   10:41 AM 03/31/2019    4:42 PM 03/24/2019    6:00 PM  Advanced Directives  Does Patient Have a Medical Advance Directive? Yes Yes No No No Yes Yes  Type of [place] will Healthcare Power of [place] will  Does patient want to make changes to medical advance directive?  No - Patient declined  No - Patient declined No - Patient declined  No - Patient declined  Copy of [place] Chart? Yes -  validated most recent copy scanned in chart (See row information) No - copy requested  No - copy requested   No - copy requested  Would patient like information on creating a medical advance directive?   No - Patient declined No - Patient declined No - Patient declined  No - Patient declined    Current Medications (verified) Outpatient Encounter Medications as of 04/18/2022  Medication Sig   alendronate (FOSAMAX) 70 MG tablet TAKE 1 TABLET BY MOUTH ONCE A WEEK. TAKE WITH A FULL GLASS OF WATER ON AN EMPTY STOMACH. DO NOT LAY DOWN FOR AT LEAST 2 HOURS   aspirin 81 MG EC tablet Take 81 mg by mouth daily.   Calcium Carb-Cholecalciferol (CALCIUM 600 + D PO) Take 1 tablet by mouth daily.    Cholecalciferol (VITAMIN D3) 2000 UNITS TABS Take 1 tablet by mouth daily.   ferrous sulfate 325 (65 FE) MG EC tablet Take 325 mg by mouth daily with breakfast.   gabapentin (NEURONTIN) 300 MG capsule Take 1 capsule (300 mg total) by mouth 2 (two) times daily.   Garlic 1967MG TABS Take 1 tablet by mouth 2 (two) times daily.   geriatric multivitamins-minerals (ELDERTONIC/GEVRABON) ELIX Take 15 mLs by mouth daily.   glucose blood (ACCU-CHEK AVIVA PLUS) test strip TEST BLOOD SUGAR TWICE  DAILY Dx E11.49   Omega-3 Fatty Acids (FISH OIL) 1000 MG CAPS Take 2 capsules by mouth daily.   omeprazole (PRILOSEC) 20 MG capsule  TAKE 1 CAPSULE BY MOUTH DAILY   potassium chloride SA (KLOR-CON M) 20 MEQ tablet Take 1 tablet (20 mEq total) by mouth daily.   pravastatin (PRAVACHOL) 80 MG tablet Take 1 tablet (80 mg total) by mouth daily.   Simethicone (GAS-X EXTRA STRENGTH) 125 MG CAPS Take 1 capsule (125 mg total) by mouth 3 (three) times daily before meals.   sitaGLIPtin (JANUVIA) 100 MG tablet Take 1 tablet (100 mg total) by mouth daily.   triamterene-hydrochlorothiazide (MAXZIDE-25) 37.5-25 MG tablet Take 1 tablet by mouth daily.   vitamin C (ASCORBIC ACID) 500 MG tablet Take 500 mg by mouth daily.   VITAMIN E PO Take 1 capsule  by mouth daily.   No facility-administered encounter medications on file as of 04/18/2022.    Allergies (verified) Levaquin [levofloxacin], Cephalexin, Metformin and related, Penicillins, and Lisinopril   History: Past Medical History:  Diagnosis Date   Colon polyp    Diabetes mellitus without complication (HCC)    Diverticulosis    Esophagitis    IBS (irritable bowel syndrome)    Kidney stone    hx of 1 kidney stone   Lumbosacral spondylosis without myelopathy    Obesity, mild    Other and unspecified hyperlipidemia    Pain in joint, shoulder region    Prolapse of vaginal walls without mention of uterine prolapse    Stroke ([place])    Symptomatic menopausal or female climacteric states    Past Surgical History:  Procedure Laterality Date   CARPAL TUNNEL RELEASE     CHOLECYSTECTOMY     EYE SURGERY Bilateral    cataracts   lumbar back surgery     VAGINAL HYSTERECTOMY     partial   Family History  Problem Relation Age of Onset   Cancer Mother        brain   Brain cancer Mother    GI Bleed Father    Arthritis Sister        rheumatoid   Lymphoma Sister    Pancreatic cancer Brother    Diabetes Son    Arthritis Son        back   Cancer Son        prostate   Cancer Maternal Grandfather    Suicidality Paternal Grandfather    Arthritis Son        back   Hyperlipidemia Son    Colitis Son    Social History   Socioeconomic History   Marital status: Divorced    Spouse name: Not on file   Number of children: 5   Years of education: GED   Highest education level: GED or equivalent  Occupational History   Occupation: retired  Tobacco Use   Smoking status: Former    Packs/day: 0.50    Types: Cigarettes    Start date: 10/06/1953    Quit date: 02/04/2012    Years since quitting: 10.2   Smokeless tobacco: Never  Vaping Use   Vaping Use: Never used  Substance and Sexual Activity   Alcohol use: No   Drug use: No   Sexual activity: Not Currently  Other Topics  Concern   Not on file  Social History Narrative   Lives alone. Daughter lives next door - checks on her daily and helps her with showers.   Son, [name] and daughter in law, Pamala Hurry provide transportation to shopping and dr visits.   Social Determinants of Health   Financial Resource Strain: Low Risk  (04/18/2022)   Overall Financial  Resource Strain (CARDIA)    Difficulty of Paying Living Expenses: Not hard at all  Food Insecurity: No Food Insecurity (04/18/2022)   Hunger Vital Sign    Worried About Running Out of Food in the Last Year: Never true    Ran Out of Food in the Last Year: Never true  Transportation Needs: No Transportation Needs (04/18/2022)   PRAPARE - Hydrologist (Medical): No    Lack of Transportation (Non-Medical): No  Physical Activity: Insufficiently Active (04/18/2022)   Exercise Vital Sign    Days of Exercise per Week: 7 days    Minutes of Exercise per Session: 10 min  Stress: No Stress Concern Present (04/18/2022)   [place]    Feeling of Stress : Only a little  Social Connections: Socially Isolated (04/18/2022)   Social Connection and Isolation Panel [NHANES]    Frequency of Communication with Friends and Family: More than three times a week    Frequency of Social Gatherings with Friends and Family: Three times a week    Attends Religious Services: Never    Active Member of Clubs or Organizations: No    Attends Music therapist: Never    Marital Status: Divorced    Tobacco Counseling Counseling given: Not Answered   Clinical Intake:  Pre-visit preparation completed: Yes  Pain : 0-10 Pain Score: 3  Pain Type: Chronic pain Pain Location: Generalized Pain Descriptors / Indicators: Aching, Discomfort Pain Onset: More than a month ago Pain Frequency: Intermittent     BMI - recorded: 28.9 Nutritional Status: BMI 25 -29 Overweight Nutritional  Risks: None Diabetes: Yes CBG done?: No Did pt. bring in CBG monitor from home?: No  How often do you need to have someone help you when you read instructions, pamphlets, or other written materials from your doctor or pharmacy?: 1 - Never  Diabetic? Nutrition Risk Assessment:  Has the patient had any N/V/D within the last 2 months?  No  Does the patient have any non-healing wounds?  No  Has the patient had any unintentional weight loss or weight gain?  No   Diabetes:  Is the patient diabetic?  Yes  If diabetic, was a CBG obtained today?  No  Did the patient bring in their glucometer from home?  No  How often do you monitor your CBG's? Few times per week.   Financial Strains and Diabetes Management:  Are you having any financial strains with the device, your supplies or your medication? No .  Does the patient want to be seen by Chronic Care Management for management of their diabetes?  No  Would the patient like to be referred to a Nutritionist or for Diabetic Management?  No   Diabetic Exams:  Diabetic Eye Exam: Completed 2019. Overdue for diabetic eye exam. Pt has been advised about the importance in completing this exam. She has an appt next week  Diabetic Foot Exam: Completed 12/26/2020. Pt has been advised about the importance in completing this exam. Pt is scheduled for diabetic foot exam on 04/28/22.    Interpreter Needed?: No  Information entered by :: Avis Mcmahill, LPN   Activities of Daily Living    04/18/2022    9:52 AM  In your present state of health, do you have any difficulty performing the following activities:  Hearing? 1  Comment mild  Vision? 0  Difficulty concentrating or making decisions? 0  Walking or climbing stairs? 1  Dressing or bathing? 1  Comment children help her  Doing errands, shopping? 1  Comment children drive her  Preparing Food and eating ? N  Using the Toilet? N  In the past six months, have you accidently leaked urine? N  Do you  have problems with loss of bowel control? Y  Comment only when she has diverticulitis flare up  Managing your Medications? N  Managing your Finances? N  Housekeeping or managing your Housekeeping? N    Patient Care Team: Claretta Fraise, MD as PCP - General (Family Medicine) Clarene Essex, MD as Consulting Physician (Gastroenterology) Allyn Kenner, MD (Dermatology) Harlen Labs, MD as Referring Physician (Optometry) Ilean China, RN as Registered Nurse Shea Evans, Norva Riffle, LCSW as [place] (Licensed Clinical Social Worker)  Indicate any recent [place] you may have received from other than [facility] providers in the past year (date may be approximate).     Assessment:   This is a routine wellness examination for Ronnisha.  Hearing/Vision screen Hearing Screening - Comments:: Denies hearing difficulties   Vision Screening - Comments:: Wears rx glasses - behind with routine eye exams with [facility] - has appt next week  Dietary issues and exercise activities discussed: Current Exercise Habits: Home exercise routine, Type of exercise: walking, Time (Minutes): 15, Frequency (Times/Week): 7, Weekly Exercise (Minutes/Week): 105, Intensity: Mild, Exercise limited by: None identified   Goals Addressed             This Visit's Progress    Prevent falls   Not on track    Had one fall and hurt shoulder 04/2022 Always use cane, change positions slowly, consider getting a life alert       Depression Screen    02/03/2022    2:32 PM 01/22/2022    1:44 PM 10/24/2021    1:02 PM 10/24/2021   12:49 PM 09/17/2021   10:07 AM 07/23/2021    3:47 PM 07/10/2021   10:56 AM  PHQ 2/9 Scores  PHQ - 2 Score 0 0 0 0 2 0 0  PHQ- 9 Score   4    4    Fall Risk    04/18/2022    9:48 AM 02/03/2022    2:32 PM 01/22/2022    1:44 PM 10/24/2021   12:49 PM 07/23/2021    3:47 PM  Fall Risk   Falls in the past year? 1 1 0 0 0  Comment fell Saturday, hurt  right shoulder, wearing a sling      Number falls in past yr: 0 0     Injury with Fall? 1 1     Risk for fall due to : History of fall(s);Impaired balance/gait;Orthopedic patient History of fall(s);Impaired balance/gait     Follow up Education provided;Falls prevention discussed Falls evaluation completed       FALL RISK PREVENTION PERTAINING TO THE HOME:  Any stairs in or around the home? Yes  - to basement, but she doesn't use them If so, are there any without handrails? No  Home free of loose throw rugs in walkways, pet beds, electrical cords, etc? Yes  Adequate lighting in your home to reduce risk of falls? Yes   ASSISTIVE DEVICES UTILIZED TO PREVENT FALLS:  Life alert? No  Use of a cane, walker or w/c? Yes  Grab bars in the bathroom? No  Shower chair or bench in shower? Yes  Elevated toilet seat or a handicapped toilet? Yes  TIMED UP AND GO:  Was the test performed? No . Telephonic visit  Cognitive Function:    02/03/2018   11:41 AM 02/02/2017   10:53 AM  MMSE - Mini Mental State Exam  Orientation to time 5 5  Orientation to Place 5 5  Registration 3 3  Attention/ Calculation 4 4  Recall 2 3  Language- name 2 objects 2 2  Language- repeat 1 1  Language- follow 3 step command 3 3  Language- read & follow direction 1 1  Write a sentence 1 1  Copy design 1 1  Total score 28 29        04/18/2022    9:57 AM 04/17/2021    9:58 AM 04/11/2020    9:46 AM 04/04/2019   10:51 AM  6CIT Screen  What Year? 0 points 0 points 0 points 0 points  What month? 0 points 0 points 0 points 0 points  What time? 0 points 0 points 0 points 0 points  Count back from 20 0 points 0 points 0 points 0 points  Months in reverse 0 points 2 points 2 points 0 points  Repeat phrase 4 points 4 points 2 points 2 points  Total Score 4 points 6 points 4 points 2 points    Immunizations Immunization History  Administered Date(s) Administered   Fluad Quad(high Dose 65+) 07/06/2019, 07/23/2021    Influenza Split 06/24/2012   Influenza Whole 07/06/2008   Influenza, High Dose Seasonal PF 07/02/2016, 07/07/2017, 07/05/2018   Influenza,inj,Quad PF,6+ Mos 07/06/2013, 07/05/2014, 07/05/2015   Moderna Sars-Covid-2 Vaccination 10/29/2019, 11/26/2019, 04/13/2020, 08/10/2020   Pneumococcal Conjugate-13 08/29/2013   Pneumococcal Polysaccharide-23 03/06/2004   Td 03/06/2004, 07/08/2011   Tdap 07/06/2009, 07/08/2011   Zoster Recombinat (Shingrix) 10/24/2021, 01/22/2022   Zoster, Live 08/07/2008    TDAP status: Due, Education has been provided regarding the importance of this vaccine. Advised may receive this vaccine at local pharmacy or Health Dept. Aware to provide a copy of the vaccination record if obtained from local pharmacy or Health Dept. Verbalized acceptance and understanding.  Flu Vaccine status: Up to date  Pneumococcal vaccine status: Up to date  Covid-19 vaccine status: Completed vaccines  Qualifies for Shingles Vaccine? Yes   Zostavax completed Yes   Shingrix Completed?: Yes  Screening Tests Health Maintenance  Topic Date Due   OPHTHALMOLOGY EXAM  12/12/2018   COVID-19 Vaccine (5 - Booster for Moderna series) 10/05/2020   FOOT EXAM  12/26/2021   TETANUS/TDAP  07/23/2022 (Originally 07/07/2021)   INFLUENZA VACCINE  05/06/2022   URINE MICROALBUMIN  07/23/2022   HEMOGLOBIN A1C  07/24/2022   Pneumonia Vaccine 55+ Years old  Completed   DEXA SCAN  Completed   Zoster Vaccines- Shingrix  Completed   HPV VACCINES  Aged Out    Health Maintenance  Health Maintenance Due  Topic Date Due   OPHTHALMOLOGY EXAM  12/12/2018   COVID-19 Vaccine (5 - Booster for Moderna series) 10/05/2020   FOOT EXAM  12/26/2021    Colorectal cancer screening: No longer required.   Mammogram status: No longer required due to age.  Bone Density status: Completed 09/21/2020. Results reflect: Bone density results: OSTEOPENIA. Repeat every 2 years.  Lung Cancer Screening: (Low Dose CT Chest  recommended if Age 15-80 years, 30 pack-year currently smoking OR have quit w/in 15years.) does not qualify.   Additional Screening:  Hepatitis C Screening: does not qualify  Vision Screening: Recommended annual ophthalmology exams for early detection of glaucoma and other disorders of  the eye. Is the patient up to date with their annual eye exam?  No  Who is the provider or what is the name of the office in which the patient attends annual eye exams? [facility] If pt is not established with a provider, would they like to be referred to a provider to establish care? No .   Dental Screening: Recommended annual dental exams for proper oral hygiene  Community Resource Referral / Chronic Care Management: CRR required this visit?  No   CCM required this visit?  No      Plan:     I have personally reviewed and noted the following in the patient's chart:   Medical and social history Use of alcohol, tobacco or illicit drugs  Current medications and supplements including opioid prescriptions.  Functional ability and status Nutritional status Physical activity Advanced directives List of other physicians Hospitalizations, surgeries, and ER visits in previous 12 months Vitals Screenings to include cognitive, depression, and falls Referrals and appointments  In addition, I have reviewed and discussed with patient certain preventive protocols, quality metrics, and best practice recommendations. A written personalized care plan for preventive services as well as general preventive health recommendations were provided to patient.     Sandrea Hammond, LPN   [id number]   Nurse Notes: None

## 2022-04-18 NOTE — Patient Instructions (Signed)
Theresa Watson , Thank you for taking time to come for your Medicare Wellness Visit. I appreciate your ongoing commitment to your health goals. Please review the following plan we discussed and let me know if I can assist you in the future.   Screening recommendations/referrals: Colonoscopy: no longer required Mammogram: no longer required Bone Density: Done 09/21/2020 - Repeat every 2 years  Recommended yearly ophthalmology/optometry visit for glaucoma screening and checkup Recommended yearly dental visit for hygiene and checkup  Vaccinations: Influenza vaccine: Done 07/23/2021 - Repeat annually Pneumococcal vaccine: Done 03/06/2004 & 08/29/2013 Tdap vaccine: Done 07/08/2011 - Repeat in 10 years *due Shingles vaccine: Done 10/24/2021 & 01/22/2022   Covid-19:Done 10/29/2019, 11/26/2019, 04/13/2020, 08/10/2020  Advanced directives: in chart  Conditions/risks identified: Aim for 4-6 glasses of water, plenty of protein in your diet and try to get up and walk/ stretch every hour for 5-10 minutes at a time.   Next appointment: Follow up in one year for your annual wellness visit    Preventive Care 65 Years and Older, Female Preventive care refers to lifestyle choices and visits with your health care provider that can promote health and wellness. What does preventive care include? A yearly physical exam. This is also called an annual well check. Dental exams once or twice a year. Routine eye exams. Ask your health care provider how often you should have your eyes checked. Personal lifestyle choices, including: Daily care of your teeth and gums. Regular physical activity. Eating a healthy diet. Avoiding tobacco and drug use. Limiting alcohol use. Practicing safe sex. Taking low-dose aspirin every day. Taking vitamin and mineral supplements as recommended by your health care provider. What happens during an annual well check? The services and screenings done by your health care provider during your  annual well check will depend on your age, overall health, lifestyle risk factors, and family history of disease. Counseling  Your health care provider may ask you questions about your: Alcohol use. Tobacco use. Drug use. Emotional well-being. Home and relationship well-being. Sexual activity. Eating habits. History of falls. Memory and ability to understand (cognition). Work and work Statistician. Reproductive health. Screening  You may have the following tests or measurements: Height, weight, and BMI. Blood pressure. Lipid and cholesterol levels. These may be checked every 5 years, or more frequently if you are over 61 years old. Skin check. Lung cancer screening. You may have this screening every year starting at age 29 if you have a 30-pack-year history of smoking and currently smoke or have quit within the past 15 years. Fecal occult blood test (FOBT) of the stool. You may have this test every year starting at age 70. Flexible sigmoidoscopy or colonoscopy. You may have a sigmoidoscopy every 5 years or a colonoscopy every 10 years starting at age 37. Hepatitis C blood test. Hepatitis B blood test. Sexually transmitted disease (STD) testing. Diabetes screening. This is done by checking your blood sugar (glucose) after you have not eaten for a while (fasting). You may have this done every 1-3 years. Bone density scan. This is done to screen for osteoporosis. You may have this done starting at age 56. Mammogram. This may be done every 1-2 years. Talk to your health care provider about how often you should have regular mammograms. Talk with your health care provider about your test results, treatment options, and if necessary, the need for more tests. Vaccines  Your health care provider may recommend certain vaccines, such as: Influenza vaccine. This is recommended every year.  Tetanus, diphtheria, and acellular pertussis (Tdap, Td) vaccine. You may need a Td booster every 10  years. Zoster vaccine. You may need this after age 58. Pneumococcal 13-valent conjugate (PCV13) vaccine. One dose is recommended after age 23. Pneumococcal polysaccharide (PPSV23) vaccine. One dose is recommended after age 25. Talk to your health care provider about which screenings and vaccines you need and how often you need them. This information is not intended to replace advice given to you by your health care provider. Make sure you discuss any questions you have with your health care provider. Document Released: 10/19/2015 Document Revised: 06/11/2016 Document Reviewed: 07/24/2015 Elsevier Interactive Patient Education  2017 [place] Prevention in the Home Falls can cause injuries. They can happen to people of all ages. There are many things you can do to make your home safe and to help prevent falls. What can I do on the outside of my home? Regularly fix the edges of walkways and driveways and fix any cracks. Remove anything that might make you trip as you walk through a door, such as a raised step or threshold. Trim any bushes or trees on the path to your home. Use bright outdoor lighting. Clear any walking paths of anything that might make someone trip, such as rocks or tools. Regularly check to see if handrails are loose or broken. Make sure that both sides of any steps have handrails. Any raised decks and porches should have guardrails on the edges. Have any leaves, snow, or ice cleared regularly. Use sand or salt on walking paths during winter. Clean up any spills in your garage right away. This includes oil or grease spills. What can I do in the bathroom? Use night lights. Install grab bars by the toilet and in the tub and shower. Do not use towel bars as grab bars. Use non-skid mats or decals in the tub or shower. If you need to sit down in the shower, use a plastic, non-slip stool. Keep the floor dry. Clean up any water that spills on the floor as soon as it  happens. Remove soap buildup in the tub or shower regularly. Attach bath mats securely with double-sided non-slip rug tape. Do not have throw rugs and other things on the floor that can make you trip. What can I do in the bedroom? Use night lights. Make sure that you have a light by your bed that is easy to reach. Do not use any sheets or blankets that are too big for your bed. They should not hang down onto the floor. Have a firm chair that has side arms. You can use this for support while you get dressed. Do not have throw rugs and other things on the floor that can make you trip. What can I do in the kitchen? Clean up any spills right away. Avoid walking on wet floors. Keep items that you use a lot in easy-to-reach places. If you need to reach something above you, use a strong step stool that has a grab bar. Keep electrical cords out of the way. Do not use floor polish or wax that makes floors slippery. If you must use wax, use non-skid floor wax. Do not have throw rugs and other things on the floor that can make you trip. What can I do with my stairs? Do not leave any items on the stairs. Make sure that there are handrails on both sides of the stairs and use them. Fix handrails that are broken or loose.  Make sure that handrails are as long as the stairways. Check any carpeting to make sure that it is firmly attached to the stairs. Fix any carpet that is loose or worn. Avoid having throw rugs at the top or bottom of the stairs. If you do have throw rugs, attach them to the floor with carpet tape. Make sure that you have a light switch at the top of the stairs and the bottom of the stairs. If you do not have them, ask someone to add them for you. What else can I do to help prevent falls? Wear shoes that: Do not have high heels. Have rubber bottoms. Are comfortable and fit you well. Are closed at the toe. Do not wear sandals. If you use a stepladder: Make sure that it is fully opened.  Do not climb a closed stepladder. Make sure that both sides of the stepladder are locked into place. Ask someone to hold it for you, if possible. Clearly mark and make sure that you can see: Any grab bars or handrails. First and last steps. Where the edge of each step is. Use tools that help you move around (mobility aids) if they are needed. These include: Canes. Walkers. Scooters. Crutches. Turn on the lights when you go into a dark area. Replace any light bulbs as soon as they burn out. Set up your furniture so you have a clear path. Avoid moving your furniture around. If any of your floors are uneven, fix them. If there are any pets around you, be aware of where they are. Review your medicines with your doctor. Some medicines can make you feel dizzy. This can increase your chance of falling. Ask your doctor what other things that you can do to help prevent falls. This information is not intended to replace advice given to you by your health care provider. Make sure you discuss any questions you have with your health care provider. Document Released: 07/19/2009 Document Revised: 02/28/2016 Document Reviewed: 10/27/2014 Elsevier Interactive Patient Education  2017 [employer].

## 2022-04-20 DIAGNOSIS — L509 Urticaria, unspecified: Secondary | ICD-10-CM | POA: Diagnosis not present

## 2022-04-20 DIAGNOSIS — L299 Pruritus, unspecified: Secondary | ICD-10-CM | POA: Diagnosis not present

## 2022-04-20 DIAGNOSIS — L570 Actinic keratosis: Secondary | ICD-10-CM | POA: Diagnosis not present

## 2022-04-20 DIAGNOSIS — E119 Type 2 diabetes mellitus without complications: Secondary | ICD-10-CM | POA: Diagnosis not present

## 2022-04-20 DIAGNOSIS — I10 Essential (primary) hypertension: Secondary | ICD-10-CM | POA: Diagnosis not present

## 2022-04-20 DIAGNOSIS — M1990 Unspecified osteoarthritis, unspecified site: Secondary | ICD-10-CM | POA: Diagnosis not present

## 2022-04-28 ENCOUNTER — Encounter: Payer: Self-pay | Admitting: Family Medicine

## 2022-04-28 ENCOUNTER — Ambulatory Visit (INDEPENDENT_AMBULATORY_CARE_PROVIDER_SITE_OTHER): Payer: Medicare Other | Admitting: Family Medicine

## 2022-04-28 VITALS — BP 122/67 | HR 87 | Temp 97.1°F | Ht 60.0 in | Wt 145.2 lb

## 2022-04-28 DIAGNOSIS — E1149 Type 2 diabetes mellitus with other diabetic neurological complication: Secondary | ICD-10-CM | POA: Diagnosis not present

## 2022-04-28 DIAGNOSIS — G301 Alzheimer's disease with late onset: Secondary | ICD-10-CM | POA: Diagnosis not present

## 2022-04-28 DIAGNOSIS — F02B0 Dementia in other diseases classified elsewhere, moderate, without behavioral disturbance, psychotic disturbance, mood disturbance, and anxiety: Secondary | ICD-10-CM | POA: Diagnosis not present

## 2022-04-28 DIAGNOSIS — E1169 Type 2 diabetes mellitus with other specified complication: Secondary | ICD-10-CM

## 2022-04-28 DIAGNOSIS — E785 Hyperlipidemia, unspecified: Secondary | ICD-10-CM | POA: Diagnosis not present

## 2022-04-28 DIAGNOSIS — I10 Essential (primary) hypertension: Secondary | ICD-10-CM

## 2022-04-28 DIAGNOSIS — W57XXXA Bitten or stung by nonvenomous insect and other nonvenomous arthropods, initial encounter: Secondary | ICD-10-CM

## 2022-04-28 LAB — BAYER DCA HB A1C WAIVED: HB A1C (BAYER DCA - WAIVED): 6.3 % — ABNORMAL HIGH (ref 4.8–5.6)

## 2022-04-28 MED ORDER — DONEPEZIL HCL 5 MG PO TABS: 5.0000 mg | ORAL_TABLET | Freq: Every day | ORAL | 3 refills | Status: DC

## 2022-04-28 NOTE — Progress Notes (Addendum)
Subjective:  Patient ID: Theresa Watson,  female    DOB: [birth year]  Age: 88 y.o.    CC: Medical Management of Chronic Issues   HPI Foster AERIN DELANY presents for  follow-up of hypertension. Patient has no history of headache chest pain or shortness of breath or recent cough. Patient also denies symptoms of TIA such as numbness weakness lateralizing. Patient denies side effects from medication. States taking it regularly.  Patient also  in for follow-up of elevated cholesterol. Doing well without complaints on current medication. Denies side effects  including myalgia and arthralgia and nausea. Also in today for liver function testing. Currently no chest pain, shortness of breath or other cardiovascular related symptoms noted.  Follow-up of diabetes. Patient does check blood sugar at home. See log - scanned Patient denies symptoms such as excessive hunger or urinary frequency, excessive hunger, nausea No significant hypoglycemic spells noted. Medications reviewed. Pt reports taking them regularly. Pt. denies complication/adverse reaction today.   Recent clock drawing test. 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   She has a past surgical history that includes Carpal tunnel release; Cholecystectomy; Vaginal hysterectomy; lumbar back surgery; and Eye surgery (Bilateral).   Her family history includes Arthritis in her sister, son, and son; Brain cancer in her mother; Cancer in her maternal grandfather, mother, and son; Colitis in her son;  Diabetes in her son; GI Bleed in her father; Hyperlipidemia in her son; Lymphoma in her sister; Pancreatic cancer in her brother; Suicidality in her paternal grandfather.She reports that she quit smoking about 10 years ago. Her smoking use included cigarettes. She started smoking about 68 years ago. She smoked an average of .5 packs per day. She has never used smokeless tobacco. She reports that she does not drink alcohol and does not use drugs.  Current Outpatient Medications on File Prior to Visit  Medication Sig Dispense Refill   alendronate (FOSAMAX) 70 MG tablet TAKE 1 TABLET BY MOUTH ONCE A WEEK. TAKE WITH A FULL GLASS OF WATER ON AN EMPTY STOMACH. DO NOT LAY DOWN FOR AT LEAST 2 HOURS 12 tablet 3   aspirin 81 MG EC tablet Take 81 mg by mouth daily.     Calcium Carb-Cholecalciferol (CALCIUM 600 + D PO) Take 1 tablet by mouth daily.      Cholecalciferol (VITAMIN D3) 2000 UNITS TABS Take 1 tablet by mouth daily.     ferrous sulfate 325 (65 FE) MG EC tablet Take 325 mg by mouth daily with breakfast.     gabapentin (NEURONTIN) 300 MG capsule Take 1 capsule (300 mg total) by mouth 2 (two) times daily. 409 capsule 2   Garlic 811 MG TABS Take 1 tablet by mouth 2 (two) times daily.     geriatric multivitamins-minerals (ELDERTONIC/GEVRABON) ELIX Take 15 mLs by mouth daily.     glucose blood (ACCU-CHEK AVIVA PLUS) test strip TEST BLOOD SUGAR TWICE  DAILY Dx E11.49 200 strip 3   Omega-3 Fatty Acids (FISH OIL) 1000 MG CAPS Take 2 capsules by mouth daily.     omeprazole (PRILOSEC) 20 MG capsule TAKE 1 CAPSULE BY MOUTH DAILY 90 capsule 0  potassium chloride SA (KLOR-CON M) 20 MEQ tablet Take 1 tablet (20 mEq total) by mouth daily. 90 tablet 2   pravastatin (PRAVACHOL) 80 MG tablet Take 1 tablet (80 mg total) by mouth daily. 90 tablet 2   Simethicone (GAS-X EXTRA STRENGTH) 125 MG CAPS Take 1 capsule (125 mg total) by mouth 3 (three) times daily before meals. 90 capsule 2   sitaGLIPtin (JANUVIA) 100 MG  tablet Take 1 tablet (100 mg total) by mouth daily. 90 tablet 2   triamterene-hydrochlorothiazide (MAXZIDE-25) 37.5-25 MG tablet Take 1 tablet by mouth daily. 90 tablet 2   vitamin C (ASCORBIC ACID) 500 MG tablet Take 500 mg by mouth daily.     VITAMIN E PO Take 1 capsule by mouth daily.     No current facility-administered medications on file prior to visit.    ROS Review of Systems  Constitutional: Negative.   HENT: Negative.    Eyes:  Negative for visual disturbance.  Respiratory:  Negative for shortness of breath.   Cardiovascular:  Negative for chest pain.  Gastrointestinal:  Positive for diarrhea (several loose bowel movements yesterday only.). Negative for abdominal pain.  Musculoskeletal:  Negative for arthralgias.    Objective:  BP 122/67   Pulse 87   Temp (!) 97.1 F (36.2 C)   Ht 5' (1.524 m)   Wt 145 lb 3.2 oz (65.9 kg)   SpO2 95%   BMI 28.36 kg/m   BP Readings from Last 3 Encounters:  04/28/22 122/67  02/03/22 125/73  02/01/22 135/68    Wt Readings from Last 3 Encounters:  04/28/22 145 lb 3.2 oz (65.9 kg)  04/18/22 148 lb (67.1 kg)  02/03/22 148 lb 9.6 oz (67.4 kg)     Physical Exam Constitutional:      General: She is not in acute distress.    Appearance: She is well-developed.  Cardiovascular:     Rate and Rhythm: Normal rate and regular rhythm.  Pulmonary:     Breath sounds: Normal breath sounds.  Musculoskeletal:        General: Normal range of motion.  Skin:    General: Skin is warm and dry.  Neurological:     Mental Status: She is alert and oriented to person, place, and time.   Clock drawing test:   Results for orders placed or performed in visit on 04/28/22  Bayer DCA Hb A1c Waived  Result Value Ref Range   HB A1C (BAYER DCA - WAIVED) 6.3 (H) 4.8 - 5.6 %      Lab Results  Component Value Date   HGBA1C 6.3 (H) 04/28/2022   HGBA1C 6.1 (H) 01/22/2022   HGBA1C 6.4 (H) 10/24/2021    Assessment & Plan:   Sedalia was seen  today for medical management of chronic issues.  Diagnoses and all orders for this visit:  Type 2 diabetes mellitus with neurological manifestations, controlled ([place]) -     Bayer DCA Hb A1c Waived  Hyperlipidemia associated with type 2 diabetes mellitus ([place]) -     Lipid panel  Primary hypertension -     CBC with Differential/Platelet -     CMP14+EGFR  Tick bite, initial encounter -     Alpha-Gal Panel -     Lyme Disease Serology w/Reflex  Other orders -     donepezil (ARICEPT) 5 MG tablet; Take 1 tablet (5 mg total) by mouth at bedtime.   I am having Emmaline M. Poth start on donepezil. I am also having her maintain  her aspirin EC, Vitamin D3, geriatric multivitamins-minerals, Fish Oil, ferrous sulfate, Garlic, Calcium Carb-Cholecalciferol (CALCIUM 600 + D PO), vitamin C, VITAMIN E PO, Simethicone, Accu-Chek Aviva Plus, alendronate, gabapentin, sitaGLIPtin, potassium chloride SA, pravastatin, triamterene-hydrochlorothiazide, and omeprazole.  Meds ordered this encounter  Medications   donepezil (ARICEPT) 5 MG tablet    Sig: Take 1 tablet (5 mg total) by mouth at bedtime.    Dispense:  30 tablet    Refill:  3     Follow-up: Return in about 3 months (around 07/29/2022) for diabetes.  Claretta Fraise, M.D.

## 2022-04-29 LAB — CMP14+EGFR
ALT: 19 IU/L (ref 0–32)
AST: 16 IU/L (ref 0–40)
Albumin/Globulin Ratio: 2.3 — ABNORMAL HIGH (ref 1.2–2.2)
Albumin: 4.5 g/dL (ref 3.7–4.7)
Alkaline Phosphatase: 57 IU/L (ref 44–121)
BUN/Creatinine Ratio: 14 (ref 12–28)
BUN: 12 mg/dL (ref 8–27)
Bilirubin Total: 0.5 mg/dL (ref 0.0–1.2)
CO2: 24 mmol/L (ref 20–29)
Calcium: 9.7 mg/dL (ref 8.7–10.3)
Chloride: 92 mmol/L — ABNORMAL LOW (ref 96–106)
Creatinine, Ser: 0.86 mg/dL (ref 0.57–1.00)
Globulin, Total: 2 g/dL (ref 1.5–4.5)
Glucose: 132 mg/dL — ABNORMAL HIGH (ref 70–99)
Potassium: 3.6 mmol/L (ref 3.5–5.2)
Sodium: 134 mmol/L (ref 134–144)
Total Protein: 6.5 g/dL (ref 6.0–8.5)
eGFR: 66 mL/min/{1.73_m2} (ref >59)

## 2022-04-29 LAB — CBC WITH DIFFERENTIAL/PLATELET
Basophils Absolute: 0 10*3/uL (ref 0.0–0.2)
Basos: 0 %
EOS (ABSOLUTE): 0.2 10*3/uL (ref 0.0–0.4)
Eos: 2 %
Hematocrit: 35.2 % (ref 34.0–46.6)
Hemoglobin: 12.2 g/dL (ref 11.1–15.9)
Immature Grans (Abs): 0.2 10*3/uL — ABNORMAL HIGH (ref 0.0–0.1)
Immature Granulocytes: 2 %
Lymphocytes Absolute: 2.6 10*3/uL (ref 0.7–3.1)
Lymphs: 23 %
MCH: 32.6 pg (ref 26.6–33.0)
MCHC: 34.7 g/dL (ref 31.5–35.7)
MCV: 94 fL (ref 79–97)
Monocytes Absolute: 1.3 10*3/uL — ABNORMAL HIGH (ref 0.1–0.9)
Monocytes: 12 %
Neutrophils Absolute: 7 10*3/uL (ref 1.4–7.0)
Neutrophils: 61 %
Platelets: 302 10*3/uL (ref 150–450)
RBC: 3.74 x10E6/uL — ABNORMAL LOW (ref 3.77–5.28)
RDW: 11.6 % — ABNORMAL LOW (ref 11.7–15.4)
WBC: 11.3 10*3/uL — ABNORMAL HIGH (ref 3.4–10.8)

## 2022-04-29 LAB — LIPID PANEL
Chol/HDL Ratio: 3.7 ratio (ref 0.0–4.4)
Cholesterol, Total: 146 mg/dL (ref 100–199)
HDL: 40 mg/dL (ref >39)
LDL Chol Calc (NIH): 74 mg/dL (ref 0–99)
Triglycerides: 193 mg/dL — ABNORMAL HIGH (ref 0–149)
VLDL Cholesterol Cal: 32 mg/dL (ref 5–40)

## 2022-04-29 NOTE — Progress Notes (Signed)
Hello Abbe,  Your lab result is normal and/or stable.Some minor variations that are not significant are commonly marked abnormal, but do not represent any medical problem for you.  Best regards, Ralphine Hinks, M.D.

## 2022-04-30 ENCOUNTER — Other Ambulatory Visit: Payer: Medicare Other

## 2022-04-30 DIAGNOSIS — R197 Diarrhea, unspecified: Secondary | ICD-10-CM | POA: Diagnosis not present

## 2022-04-30 DIAGNOSIS — W57XXXA Bitten or stung by nonvenomous insect and other nonvenomous arthropods, initial encounter: Secondary | ICD-10-CM | POA: Diagnosis not present

## 2022-05-03 LAB — ALPHA-GAL PANEL
Allergen Lamb IgE: 11 kU/L — AB
Beef IgE: 27.6 kU/L — AB
IgE (Immunoglobulin E), Serum: 634 IU/mL — ABNORMAL HIGH (ref 6–495)
O215-IgE Alpha-Gal: >100 kU/L — AB
Pork IgE: 14.3 kU/L — AB

## 2022-05-03 LAB — LYME DISEASE SEROLOGY W/REFLEX: Lyme Total Antibody EIA: NEGATIVE

## 2022-05-07 ENCOUNTER — Encounter: Payer: Self-pay | Admitting: *Deleted

## 2022-05-08 ENCOUNTER — Ambulatory Visit: Payer: Self-pay | Admitting: *Deleted

## 2022-05-08 NOTE — Chronic Care Management (AMB) (Signed)
  Chronic Care Management   Note  05/08/2022 Name: Theresa Watson MRN: [id number] DOB: Nov 24, 1932   Due to changes in the Chronic Care Management program, I am removing myself as the [place] from the Care Team and closing [facility]. Patient was not scheduled to be followed by the RN Care Coordination nurse for [facility].   Patient has an open Care Plan with another CCM team member, Theadore Nan, LCSW.  I will forward this case closure encounter to the other team member(s). Patient does not have a current CCM referral placed since 02/03/22. CCM enrollment status changed to "not enrolled".   Patient's PCP can place a new referral if the they needs Care Management or Care Coordination services in the future.  Chong Sicilian, BSN, RN-BC Proofreader Dial: [phone]

## 2022-05-08 NOTE — Patient Instructions (Signed)
Theresa Watson  At some point during the past 4 years, I have worked with you through the [facility] Management Program at [facility].  Due to program changes I am removing myself from your care team.   If you are currently active with another CCM Team Member, you will remain active with them unless they reach out to you with additional information.   If you feel that you need services in the future,  please talk with your primary care provider and request a new referral for Care Management or Care Coordination services. This does not affect your status as a patient at [facility].   Thank you for allowing me to participate in your your healthcare journey.  Theresa Watson, BSN, RN-BC Proofreader Dial: [phone]

## 2022-05-21 ENCOUNTER — Other Ambulatory Visit: Payer: Self-pay | Admitting: Family Medicine

## 2022-05-21 DIAGNOSIS — K580 Irritable bowel syndrome with diarrhea: Secondary | ICD-10-CM

## 2022-05-26 DIAGNOSIS — H26493 Other secondary cataract, bilateral: Secondary | ICD-10-CM | POA: Diagnosis not present

## 2022-05-28 ENCOUNTER — Other Ambulatory Visit: Payer: Self-pay | Admitting: Family Medicine

## 2022-05-28 DIAGNOSIS — E1149 Type 2 diabetes mellitus with other diabetic neurological complication: Secondary | ICD-10-CM

## 2022-06-03 ENCOUNTER — Telehealth: Payer: Self-pay | Admitting: *Deleted

## 2022-06-03 NOTE — Patient Outreach (Signed)
  Care Coordination   06/03/2022 Name: Theresa Watson MRN: [id number] DOB: 11-03-1932   Care Coordination Outreach Attempts:  An unsuccessful telephone outreach was attempted today to offer the patient information about available care coordination services as a benefit of their health plan.   Follow Up Plan:  Additional outreach attempts will be made to offer the patient care coordination information and services.   Encounter Outcome:  No Answer  Care Coordination Interventions Activated:  No   Care Coordination Interventions:  No, not indicated    Emelia Loron RN, BSN [place] [phone] [email or web address]

## 2022-06-20 DIAGNOSIS — R21 Rash and other nonspecific skin eruption: Secondary | ICD-10-CM | POA: Diagnosis not present

## 2022-06-20 DIAGNOSIS — X58XXXA Exposure to other specified factors, initial encounter: Secondary | ICD-10-CM | POA: Diagnosis not present

## 2022-06-20 DIAGNOSIS — Z91014 Allergy to mammalian meats: Secondary | ICD-10-CM | POA: Diagnosis not present

## 2022-06-20 DIAGNOSIS — L509 Urticaria, unspecified: Secondary | ICD-10-CM | POA: Diagnosis not present

## 2022-06-20 DIAGNOSIS — E119 Type 2 diabetes mellitus without complications: Secondary | ICD-10-CM | POA: Diagnosis not present

## 2022-06-20 DIAGNOSIS — I10 Essential (primary) hypertension: Secondary | ICD-10-CM | POA: Diagnosis not present

## 2022-06-20 DIAGNOSIS — T781XXA Other adverse food reactions, not elsewhere classified, initial encounter: Secondary | ICD-10-CM | POA: Diagnosis not present

## 2022-06-21 DIAGNOSIS — I10 Essential (primary) hypertension: Secondary | ICD-10-CM | POA: Diagnosis not present

## 2022-06-21 DIAGNOSIS — R21 Rash and other nonspecific skin eruption: Secondary | ICD-10-CM | POA: Diagnosis not present

## 2022-06-21 DIAGNOSIS — Z91014 Allergy to mammalian meats: Secondary | ICD-10-CM | POA: Diagnosis not present

## 2022-06-21 DIAGNOSIS — X58XXXA Exposure to other specified factors, initial encounter: Secondary | ICD-10-CM | POA: Diagnosis not present

## 2022-06-21 DIAGNOSIS — L509 Urticaria, unspecified: Secondary | ICD-10-CM | POA: Diagnosis not present

## 2022-06-21 DIAGNOSIS — E119 Type 2 diabetes mellitus without complications: Secondary | ICD-10-CM | POA: Diagnosis not present

## 2022-06-21 DIAGNOSIS — T781XXA Other adverse food reactions, not elsewhere classified, initial encounter: Secondary | ICD-10-CM | POA: Diagnosis not present

## 2022-07-15 ENCOUNTER — Other Ambulatory Visit: Payer: Self-pay | Admitting: *Deleted

## 2022-07-15 NOTE — Patient Outreach (Signed)
  Care Coordination   07/15/2022  Name: Theresa Watson MRN: [id number] DOB: 1933-02-09   Care Coordination Outreach Attempts:  A second unsuccessful outreach was attempted today to offer the patient with information about available care coordination services as a benefit of their health plan.   HIPAA compliant messages left on voicemail, providing contact information for CSW, encouraging patient to return CSW's call at her earliest convenience.  Follow Up Plan:  Additional outreach attempts will be made to offer the patient care coordination information and services.   Encounter Outcome:  No Answer.    Care Coordination Interventions Activated:  No.     Care Coordination Interventions:  No, not indicated.     Nat Christen, BSW, MSW, LCSW  Licensed Education officer, environmental Health System  Mailing [place] [address], [place], [place] [address] Physical Address-[address] [place], [place] [address] Toll Free Main # [phone] Fax # [phone] Cell # [phone] [email or web address]

## 2022-07-22 ENCOUNTER — Other Ambulatory Visit: Payer: Self-pay | Admitting: *Deleted

## 2022-07-22 NOTE — Patient Outreach (Signed)
  Care Coordination   07/22/2022  Name: Theresa Watson MRN: [id number] DOB: Aug 04, 1933   Care Coordination Outreach Attempts:  A third unsuccessful outreach was attempted today to offer the patient with information about available care coordination services as a benefit of their health plan. HIPAA compliant messages left on voicemail, providing contact information for CSW, encouraging patient to return CSW's call at her earliest convenience.  Follow Up Plan:  No further outreach attempts will be made at this time. We have been unable to contact the patient to offer or enroll patient in care coordination services.  Encounter Outcome:  No Answer.   Care Coordination Interventions Activated:  No.    Care Coordination Interventions:  No, not indicated.    Nat Christen, BSW, MSW, LCSW  Licensed Education officer, environmental Health System  Mailing [place] [address] [place], [place] [address] Physical Address[address] [place], [place] [address] Toll Free Main # [phone] Fax # [phone] Cell # [phone] [email or web address]

## 2022-07-29 ENCOUNTER — Ambulatory Visit: Payer: Medicare Other | Admitting: Family Medicine

## 2022-08-03 ENCOUNTER — Other Ambulatory Visit: Payer: Self-pay | Admitting: Family Medicine

## 2022-08-07 ENCOUNTER — Other Ambulatory Visit: Payer: Self-pay | Admitting: Family Medicine

## 2022-08-07 DIAGNOSIS — K580 Irritable bowel syndrome with diarrhea: Secondary | ICD-10-CM

## 2022-08-07 DIAGNOSIS — E1149 Type 2 diabetes mellitus with other diabetic neurological complication: Secondary | ICD-10-CM

## 2022-08-27 ENCOUNTER — Encounter: Payer: Self-pay | Admitting: Family Medicine

## 2022-08-27 ENCOUNTER — Other Ambulatory Visit: Payer: Self-pay | Admitting: Family Medicine

## 2022-08-27 ENCOUNTER — Ambulatory Visit (INDEPENDENT_AMBULATORY_CARE_PROVIDER_SITE_OTHER): Payer: Medicare Other | Admitting: Family Medicine

## 2022-08-27 ENCOUNTER — Ambulatory Visit (INDEPENDENT_AMBULATORY_CARE_PROVIDER_SITE_OTHER): Payer: Medicare Other

## 2022-08-27 VITALS — BP 130/66 | HR 77 | Temp 97.6°F | Ht 60.0 in | Wt 141.2 lb

## 2022-08-27 DIAGNOSIS — E1149 Type 2 diabetes mellitus with other diabetic neurological complication: Secondary | ICD-10-CM | POA: Diagnosis not present

## 2022-08-27 DIAGNOSIS — M1611 Unilateral primary osteoarthritis, right hip: Secondary | ICD-10-CM | POA: Diagnosis not present

## 2022-08-27 DIAGNOSIS — I10 Essential (primary) hypertension: Secondary | ICD-10-CM | POA: Diagnosis not present

## 2022-08-27 DIAGNOSIS — Z23 Encounter for immunization: Secondary | ICD-10-CM

## 2022-08-27 DIAGNOSIS — K580 Irritable bowel syndrome with diarrhea: Secondary | ICD-10-CM | POA: Diagnosis not present

## 2022-08-27 DIAGNOSIS — M25551 Pain in right hip: Secondary | ICD-10-CM

## 2022-08-27 DIAGNOSIS — E785 Hyperlipidemia, unspecified: Secondary | ICD-10-CM | POA: Diagnosis not present

## 2022-08-27 DIAGNOSIS — E1169 Type 2 diabetes mellitus with other specified complication: Secondary | ICD-10-CM | POA: Diagnosis not present

## 2022-08-27 LAB — BAYER DCA HB A1C WAIVED: HB A1C (BAYER DCA - WAIVED): 6.2 % — ABNORMAL HIGH (ref 4.8–5.6)

## 2022-08-27 MED ORDER — ACETAMINOPHEN 500 MG PO TABS: 1000.0000 mg | ORAL_TABLET | Freq: Three times a day (TID) | ORAL | 99 refills | Status: DC

## 2022-08-27 MED ORDER — SITAGLIPTIN PHOSPHATE 100 MG PO TABS: 100.0000 mg | ORAL_TABLET | Freq: Every day | ORAL | 3 refills | Status: DC

## 2022-08-27 MED ORDER — OMEPRAZOLE 20 MG PO CPDR: 20.0000 mg | DELAYED_RELEASE_CAPSULE | Freq: Every day | ORAL | 3 refills | Status: DC

## 2022-08-27 MED ORDER — FEXOFENADINE HCL 180 MG PO TABS: 180.0000 mg | ORAL_TABLET | Freq: Every day | ORAL | 11 refills | Status: DC

## 2022-08-27 MED ORDER — BETAMETHASONE SOD PHOS & ACET 6 (3-3) MG/ML IJ SUSP
6.0000 mg | Freq: Once | INTRAMUSCULAR | Status: AC
  Administered 2022-08-27: 6 mg via INTRAMUSCULAR

## 2022-08-27 NOTE — Progress Notes (Addendum)
Subjective:  Patient ID: Theresa Watson, female    DOB: 08-16-1933  Age: 88 y.o. MRN: [id number]  CC: Medical Management of Chronic Issues   HPI Theresa Watson presents forFollow-up of diabetes. Patient checks blood sugar at home.   120-150 fasting and 140-190 postprandial Patient denies symptoms such as polyuria, polydipsia, excessive hunger, nausea No significant hypoglycemic spells noted. Medications reviewed. Pt reports taking them regularly without complication/adverse reaction being reported today.    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   She has a past surgical history that includes Carpal tunnel release; Cholecystectomy; Vaginal hysterectomy; lumbar back surgery; and Eye surgery (Bilateral).   Her family history includes Arthritis in her sister, son, and son; Brain cancer in her mother; Cancer in her maternal grandfather, mother, and son; Colitis in her son; Diabetes in her son; GI Bleed in her father; Hyperlipidemia in her son; Lymphoma in her sister; Pancreatic cancer in her brother; Suicidality in her paternal grandfather.She reports that she quit smoking about 10 years ago. Her smoking use included cigarettes. She started smoking about 68 years ago. She smoked an average of .5 packs per day. She has never used smokeless tobacco. She reports that she does not drink alcohol and does not use drugs.  Current Outpatient Medications on File Prior to Visit  Medication Sig Dispense Refill   alendronate (FOSAMAX) 70 MG tablet TAKE 1 TABLET BY MOUTH ONCE  WEEKLY ; TAKE WITH A FULL GLASS   OF WATER ON AN EMPTY STOMACH ;  DO NOT LAY DOWN FOR AT LEAST 2  HOURS 12 tablet 0   aspirin 81 MG EC tablet Take 81 mg by mouth daily.     Calcium Carb-Cholecalciferol (CALCIUM 600 + D PO) Take 1 tablet by mouth daily.      Cholecalciferol (VITAMIN D3) 2000 UNITS TABS Take 1 tablet by mouth daily.     ferrous sulfate 325 (65 FE) MG EC tablet Take 325 mg by mouth daily with breakfast.     gabapentin (NEURONTIN) 300 MG capsule TAKE 1 CAPSULE BY MOUTH TWICE  DAILY 262 capsule 0   Garlic 035 MG TABS Take 1 tablet by mouth 2 (two) times daily.     geriatric multivitamins-minerals (ELDERTONIC/GEVRABON) ELIX Take 15 mLs by mouth daily.     glucose blood (ACCU-CHEK AVIVA PLUS) test strip TEST BLOOD SUGAR TWICE DAILY Dx E11.49 200 strip 3   Omega-3 Fatty Acids (FISH OIL) 1000 MG CAPS Take 2 capsules by mouth daily.     potassium chloride SA (KLOR-CON M) 20 MEQ tablet TAKE 1 TABLET BY MOUTH DAILY 90 tablet 1   pravastatin (PRAVACHOL) 80 MG tablet TAKE 1 TABLET BY MOUTH DAILY 90 tablet 1   Simethicone (GAS-X EXTRA STRENGTH) 125 MG CAPS Take 1 capsule (125 mg total) by mouth 3 (three) times daily before meals. 90 capsule 2   triamterene-hydrochlorothiazide (MAXZIDE-25) 37.5-25 MG tablet TAKE 1 TABLET BY MOUTH DAILY 90 tablet 1   vitamin C (ASCORBIC ACID) 500 MG tablet Take 500 mg by mouth daily.     VITAMIN E PO Take 1 capsule by mouth daily.     No current facility-administered medications on file prior to visit.  ROS Review of Systems  Constitutional: Negative.   HENT:  Positive for rhinorrhea.   Eyes:  Negative for visual disturbance.  Respiratory:  Negative for shortness of breath.   Cardiovascular:  Negative for chest pain.  Gastrointestinal:  Negative for abdominal pain.  Musculoskeletal:  Positive for arthralgias (right hip and leg. Hard to pull on her pants, etc).    Objective:  BP 130/66   Pulse 77   Temp 97.6 F (36.4 C)   Ht 5' (1.524 m)   Wt 141 lb 3.2 oz (64 kg)   SpO2 96%    BMI 27.58 kg/m   BP Readings from Last 3 Encounters:  08/27/22 130/66  04/28/22 122/67  02/03/22 125/73    Wt Readings from Last 3 Encounters:  08/27/22 141 lb 3.2 oz (64 kg)  04/28/22 145 lb 3.2 oz (65.9 kg)  04/18/22 148 lb (67.1 kg)     Physical Exam Constitutional:      General: She is not in acute distress.    Appearance: She is well-developed.  Cardiovascular:     Rate and Rhythm: Normal rate and regular rhythm.  Pulmonary:     Breath sounds: Normal breath sounds.  Musculoskeletal:        General: Normal range of motion.  Skin:    General: Skin is warm and dry.  Neurological:     Mental Status: She is alert and oriented to person, place, and time.       Assessment & Plan:   Theresa Watson was seen today for medical management of chronic issues.  Diagnoses and all orders for this visit:  Type 2 diabetes mellitus with neurological manifestations, controlled ([place]) -     Bayer DCA Hb A1c Waived -     sitaGLIPtin (JANUVIA) 100 MG tablet; Take 1 tablet (100 mg total) by mouth daily.  Hyperlipidemia associated with type 2 diabetes mellitus ([place]) -     Lipid panel  Primary hypertension -     CBC with Differential/Platelet -     CMP14+EGFR  Irritable bowel syndrome with diarrhea -     omeprazole (PRILOSEC) 20 MG capsule; Take 1 capsule (20 mg total) by mouth daily.  Pain of right hip -     DG HIP UNILAT W OR W/O PELVIS 2-3 VIEWS RIGHT; Future -     betamethasone acetate-betamethasone sodium phosphate (CELESTONE) injection 6 mg  Other orders -     fexofenadine (ALLEGRA) 180 MG tablet; Take 1 tablet (180 mg total) by mouth daily. For allergy symptoms -     acetaminophen (TYLENOL) 500 MG tablet; Take 2 tablets (1,000 mg total) by mouth 3 (three) times daily.      I have changed Theresa Watson's Januvia to sitaGLIPtin. I have also changed her omeprazole. I am also having her start on fexofenadine and acetaminophen. Additionally, I am having her maintain her  aspirin EC, Vitamin D3, geriatric multivitamins-minerals, Fish Oil, ferrous sulfate, Garlic, Calcium Carb-Cholecalciferol (CALCIUM 600 + D PO), ascorbic acid, VITAMIN E PO, Simethicone, potassium chloride SA, pravastatin, triamterene-hydrochlorothiazide, alendronate, gabapentin, and Accu-Chek Aviva Plus. We will continue to administer betamethasone acetate-betamethasone sodium phosphate.  Meds ordered this encounter  Medications   omeprazole (PRILOSEC) 20 MG capsule    Sig: Take 1 capsule (20 mg total) by mouth daily.    Dispense:  90 capsule    Refill:  3    Please send a replace/new response with 90-Day Supply if appropriate to maximize member benefit.   sitaGLIPtin (JANUVIA) 100 MG tablet  Sig: Take 1 tablet (100 mg total) by mouth daily.    Dispense:  90 tablet    Refill:  3    Please send a replace/new response with 90-Day Supply if appropriate to maximize member benefit.   fexofenadine (ALLEGRA) 180 MG tablet    Sig: Take 1 tablet (180 mg total) by mouth daily. For allergy symptoms    Dispense:  30 tablet    Refill:  11   acetaminophen (TYLENOL) 500 MG tablet    Sig: Take 2 tablets (1,000 mg total) by mouth 3 (three) times daily.    Dispense:  180 tablet    Refill:  PRN   betamethasone acetate-betamethasone sodium phosphate (CELESTONE) injection 6 mg     Follow-up: Return in about 3 months (around 11/27/2022).  Claretta Fraise, M.D.

## 2022-08-28 LAB — CBC WITH DIFFERENTIAL/PLATELET
Basophils Absolute: 0.1 10*3/uL (ref 0.0–0.2)
Basos: 1 %
EOS (ABSOLUTE): 0.2 10*3/uL (ref 0.0–0.4)
Eos: 3 %
Hematocrit: 34.3 % (ref 34.0–46.6)
Hemoglobin: 11.8 g/dL (ref 11.1–15.9)
Immature Grans (Abs): 0 10*3/uL (ref 0.0–0.1)
Immature Granulocytes: 0 %
Lymphocytes Absolute: 2.4 10*3/uL (ref 0.7–3.1)
Lymphs: 31 %
MCH: 32.1 pg (ref 26.6–33.0)
MCHC: 34.4 g/dL (ref 31.5–35.7)
MCV: 93 fL (ref 79–97)
Monocytes Absolute: 0.7 10*3/uL (ref 0.1–0.9)
Monocytes: 9 %
Neutrophils Absolute: 4.4 10*3/uL (ref 1.4–7.0)
Neutrophils: 56 %
Platelets: 378 10*3/uL (ref 150–450)
RBC: 3.68 x10E6/uL — ABNORMAL LOW (ref 3.77–5.28)
RDW: 11.4 % — ABNORMAL LOW (ref 11.7–15.4)
WBC: 7.8 10*3/uL (ref 3.4–10.8)

## 2022-08-28 LAB — CMP14+EGFR
ALT: 13 IU/L (ref 0–32)
AST: 16 IU/L (ref 0–40)
Albumin/Globulin Ratio: 2.1 (ref 1.2–2.2)
Albumin: 4.6 g/dL (ref 3.7–4.7)
Alkaline Phosphatase: 69 IU/L (ref 44–121)
BUN/Creatinine Ratio: 19 (ref 12–28)
BUN: 14 mg/dL (ref 8–27)
Bilirubin Total: 0.3 mg/dL (ref 0.0–1.2)
CO2: 24 mmol/L (ref 20–29)
Calcium: 10.2 mg/dL (ref 8.7–10.3)
Chloride: 92 mmol/L — ABNORMAL LOW (ref 96–106)
Creatinine, Ser: 0.75 mg/dL (ref 0.57–1.00)
Globulin, Total: 2.2 g/dL (ref 1.5–4.5)
Glucose: 153 mg/dL — ABNORMAL HIGH (ref 70–99)
Potassium: 3.6 mmol/L (ref 3.5–5.2)
Sodium: 134 mmol/L (ref 134–144)
Total Protein: 6.8 g/dL (ref 6.0–8.5)
eGFR: 77 mL/min/{1.73_m2} (ref >59)

## 2022-08-28 LAB — LIPID PANEL
Chol/HDL Ratio: 3.4 ratio (ref 0.0–4.4)
Cholesterol, Total: 140 mg/dL (ref 100–199)
HDL: 41 mg/dL (ref >39)
LDL Chol Calc (NIH): 58 mg/dL (ref 0–99)
Triglycerides: 257 mg/dL — ABNORMAL HIGH (ref 0–149)
VLDL Cholesterol Cal: 41 mg/dL — ABNORMAL HIGH (ref 5–40)

## 2022-09-01 ENCOUNTER — Other Ambulatory Visit: Payer: Self-pay

## 2022-09-01 MED ORDER — ALENDRONATE SODIUM 70 MG PO TABS: 70.0000 mg | ORAL_TABLET | ORAL | 0 refills | Status: DC

## 2022-09-01 NOTE — Progress Notes (Signed)
Hello Chanae,  Your lab result is normal and/or stable.Some minor variations that are not significant are commonly marked abnormal, but do not represent any medical problem for you.  Best regards, Wanette Robison, M.D.

## 2022-09-01 NOTE — Progress Notes (Signed)
Your hip x-ray shows arthritis and osteopenia. I recommend weekly fosamax. Nurse, if pt. Is agreeable, send in Fosamax 70 mg weekly, #13.  Thanks, WS

## 2022-10-23 ENCOUNTER — Other Ambulatory Visit: Payer: Self-pay | Admitting: Family Medicine

## 2022-10-27 ENCOUNTER — Other Ambulatory Visit: Payer: Self-pay | Admitting: Family Medicine

## 2022-10-27 DIAGNOSIS — E1149 Type 2 diabetes mellitus with other diabetic neurological complication: Secondary | ICD-10-CM

## 2022-10-27 DIAGNOSIS — K580 Irritable bowel syndrome with diarrhea: Secondary | ICD-10-CM

## 2022-12-01 ENCOUNTER — Ambulatory Visit (INDEPENDENT_AMBULATORY_CARE_PROVIDER_SITE_OTHER): Payer: Medicare Other | Admitting: Family Medicine

## 2022-12-01 ENCOUNTER — Encounter: Payer: Self-pay | Admitting: Family Medicine

## 2022-12-01 VITALS — BP 130/70 | HR 79 | Temp 97.9°F | Ht 60.0 in | Wt 141.2 lb

## 2022-12-01 DIAGNOSIS — E1149 Type 2 diabetes mellitus with other diabetic neurological complication: Secondary | ICD-10-CM

## 2022-12-01 DIAGNOSIS — I10 Essential (primary) hypertension: Secondary | ICD-10-CM

## 2022-12-01 DIAGNOSIS — M1611 Unilateral primary osteoarthritis, right hip: Secondary | ICD-10-CM

## 2022-12-01 DIAGNOSIS — E1169 Type 2 diabetes mellitus with other specified complication: Secondary | ICD-10-CM | POA: Diagnosis not present

## 2022-12-01 DIAGNOSIS — E785 Hyperlipidemia, unspecified: Secondary | ICD-10-CM

## 2022-12-01 DIAGNOSIS — Z23 Encounter for immunization: Secondary | ICD-10-CM

## 2022-12-01 LAB — BAYER DCA HB A1C WAIVED: HB A1C (BAYER DCA - WAIVED): 6.8 % — ABNORMAL HIGH (ref 4.8–5.6)

## 2022-12-01 MED ORDER — CELECOXIB 200 MG PO CAPS: 200.0000 mg | ORAL_CAPSULE | Freq: Every day | ORAL | 5 refills | Status: DC

## 2022-12-01 NOTE — Addendum Note (Signed)
Addended by: Christia Reading on: 12/01/2022 06:12 PM   Modules accepted: Orders

## 2022-12-01 NOTE — Progress Notes (Signed)
Subjective:  Patient ID: Theresa Watson,  female    DOB: 02/20/1933  Age: 89 y.o.    CC: Medical Management of Chronic Issues   HPI Theresa Watson presents for  follow-up of hypertension. Patient has no history of headache chest pain or shortness of breath or recent cough. Patient also denies symptoms of TIA such as numbness weakness lateralizing. Patient denies side effects from medication. States taking it regularly.  Patient also  in for follow-up of elevated cholesterol. Doing well without complaints on current medication. Denies side effects  including myalgia and arthralgia and nausea. Also in today for liver function testing. Currently no chest pain, shortness of breath or other cardiovascular related symptoms noted.  Follow-up of diabetes. Patient does check blood sugar at home. Readings run between 100-130 and 130-200 Patient denies symptoms such as excessive hunger or urinary frequency, excessive hunger, nausea No significant hypoglycemic spells noted. Medications reviewed. Pt reports taking them regularly. Pt. denies complication/adverse reaction today.    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   She has a past surgical history that includes Carpal tunnel release; Cholecystectomy; Vaginal hysterectomy; lumbar back surgery; and Eye surgery (Bilateral).   Her family history includes Arthritis in her sister, son, and son; Brain cancer in her mother; Cancer in her maternal grandfather, mother, and son; Colitis in her son; Diabetes in her son; GI Bleed in her father; Hyperlipidemia  in her son; Lymphoma in her sister; Pancreatic cancer in her brother; Suicidality in her paternal grandfather.She reports that she quit smoking about 10 years ago. Her smoking use included cigarettes. She started smoking about 69 years ago. She smoked an average of .5 packs per day. She has never used smokeless tobacco. She reports that she does not drink alcohol and does not use drugs.  Current Outpatient Medications on File Prior to Visit  Medication Sig Dispense Refill   acetaminophen (TYLENOL) 500 MG tablet Take 2 tablets (1,000 mg total) by mouth 3 (three) times daily. 180 tablet PRN   alendronate (FOSAMAX) 70 MG tablet TAKE 1 TABLET BY MOUTH ONCE  WEEKLY TAKE WITH A FULL GLASS OF WATER ON AN EMPTY STOMACH ; DO  NOT LAY DOWN FOR AT LEAST 2  HOURS 12 tablet 3   aspirin 81 MG EC tablet Take 81 mg by mouth daily.     Calcium Carb-Cholecalciferol (CALCIUM 600 + D PO) Take 1 tablet by mouth daily.      Cholecalciferol (VITAMIN D3) 2000 UNITS TABS Take 1 tablet by mouth daily.     donepezil (ARICEPT) 5 MG tablet TAKE 1 TABLET BY MOUTH AT BEDTIME 90 tablet 1   ferrous sulfate 325 (65 FE) MG EC tablet Take 325 mg by mouth daily with breakfast.     fexofenadine (ALLEGRA) 180 MG tablet Take 1 tablet (180 mg total) by mouth daily. For allergy symptoms 30 tablet 11   gabapentin (NEURONTIN) 300 MG capsule TAKE 1 CAPSULE BY MOUTH TWICE  DAILY [id number] capsule 0   Garlic 123XX123 MG TABS Take 1 tablet by mouth 2 (two) times daily.     geriatric multivitamins-minerals (ELDERTONIC/GEVRABON) ELIX Take 15 mLs by mouth daily.     glucose blood (ACCU-CHEK AVIVA PLUS)  test strip TEST BLOOD SUGAR TWICE DAILY Dx E11.49 200 strip 3   Omega-3 Fatty Acids (FISH OIL) 1000 MG CAPS Take 2 capsules by mouth daily.     omeprazole (PRILOSEC) 20 MG capsule TAKE 1 CAPSULE BY MOUTH DAILY 100 capsule 0   potassium chloride SA (KLOR-CON M) 20 MEQ tablet TAKE 1 TABLET BY MOUTH DAILY 100 tablet 0   pravastatin (PRAVACHOL) 80 MG tablet TAKE 1  TABLET BY MOUTH DAILY 100 tablet 0   Simethicone (GAS-X EXTRA STRENGTH) 125 MG CAPS Take 1 capsule (125 mg total) by mouth 3 (three) times daily before meals. 90 capsule 2   sitaGLIPtin (JANUVIA) 100 MG tablet TAKE 1 TABLET BY MOUTH DAILY 100 tablet 0   triamterene-hydrochlorothiazide (MAXZIDE-25) 37.5-25 MG tablet TAKE 1 TABLET BY MOUTH DAILY 100 tablet 0   vitamin C (ASCORBIC ACID) 500 MG tablet Take 500 mg by mouth daily.     VITAMIN E PO Take 1 capsule by mouth daily.     No current facility-administered medications on file prior to visit.    ROS Review of Systems  Constitutional: Negative.   HENT: Negative.    Eyes:  Negative for visual disturbance.  Respiratory:  Negative for shortness of breath.   Cardiovascular:  Negative for chest pain.  Gastrointestinal:  Negative for abdominal pain.  Musculoskeletal:  Positive for arthralgias (right hip).    Objective:  BP 130/70   Pulse 79   Temp 97.9 F (36.6 C)   Ht 5' (1.524 m)   Wt 141 lb 3.2 oz (64 kg)   SpO2 93%   BMI 27.58 kg/m   BP Readings from Last 3 Encounters:  12/01/22 130/70  08/27/22 130/66  04/28/22 122/67    Wt Readings from Last 3 Encounters:  12/01/22 141 lb 3.2 oz (64 kg)  08/27/22 141 lb 3.2 oz (64 kg)  04/28/22 145 lb 3.2 oz (65.9 kg)     Physical Exam Constitutional:      General: She is not in acute distress.    Appearance: She is well-developed.  Cardiovascular:     Rate and Rhythm: Normal rate and regular rhythm.  Pulmonary:     Breath sounds: Normal breath sounds.  Musculoskeletal:        General: Normal range of motion.  Skin:    General: Skin is warm and dry.  Neurological:     Mental Status: She is alert and oriented to person, place, and time.     Diabetic Foot Exam - Simple   Simple Foot Form Diabetic Foot exam was performed with the following findings: Yes 12/01/2022  3:28 PM  Visual Inspection No deformities, no ulcerations, no other skin breakdown bilaterally:  Yes Sensation Testing See comments: Yes Pulse Check Posterior Tibialis and Dorsalis pulse intact bilaterally: Yes Comments Intact on right. Tips oftoes numb bilaterally     Lab Results  Component Value Date   HGBA1C 6.2 (H) 08/27/2022   HGBA1C 6.3 (H) 04/28/2022   HGBA1C 6.1 (H) 01/22/2022    Assessment & Plan:   Theresa Watson was seen today for medical management of chronic issues.  Diagnoses and all orders for this visit:  Type 2 diabetes mellitus with neurological manifestations, controlled ([place]) -     Bayer DCA Hb A1c Waived  Hyperlipidemia associated with type 2 diabetes mellitus ([place]) -     CBC with Differential/Platelet -     Lipid panel  Primary hypertension -     CMP14+EGFR  Arthritis of right hip  Other  orders -     celecoxib (CELEBREX) 200 MG capsule; Take 1 capsule (200 mg total) by mouth daily. With food   I am having Kathi M. Loge start on celecoxib. I am also having her maintain her aspirin EC, Vitamin D3, geriatric multivitamins-minerals, Fish Oil, ferrous sulfate, Garlic, Calcium Carb-Cholecalciferol (CALCIUM 600 + D PO), ascorbic acid, VITAMIN E PO, Simethicone, Accu-Chek Aviva Plus, fexofenadine, acetaminophen, donepezil, alendronate, Januvia, triamterene-hydrochlorothiazide, omeprazole, gabapentin, pravastatin, and potassium chloride SA.  Meds ordered this encounter  Medications   celecoxib (CELEBREX) 200 MG capsule    Sig: Take 1 capsule (200 mg total) by mouth daily. With food    Dispense:  30 capsule    Refill:  5     Follow-up: Return in about 3 months (around 03/01/2023).  Claretta Fraise, M.D.

## 2022-12-02 LAB — LIPID PANEL
Chol/HDL Ratio: 3.1 ratio (ref 0.0–4.4)
Cholesterol, Total: 145 mg/dL (ref 100–199)
HDL: 47 mg/dL (ref >39)
LDL Chol Calc (NIH): 53 mg/dL (ref 0–99)
Triglycerides: 294 mg/dL — ABNORMAL HIGH (ref 0–149)
VLDL Cholesterol Cal: 45 mg/dL — ABNORMAL HIGH (ref 5–40)

## 2022-12-02 LAB — CBC WITH DIFFERENTIAL/PLATELET
Basophils Absolute: 0 10*3/uL (ref 0.0–0.2)
Basos: 1 %
EOS (ABSOLUTE): 0.2 10*3/uL (ref 0.0–0.4)
Eos: 3 %
Hematocrit: 34.9 % (ref 34.0–46.6)
Hemoglobin: 12 g/dL (ref 11.1–15.9)
Immature Grans (Abs): 0 10*3/uL (ref 0.0–0.1)
Immature Granulocytes: 1 %
Lymphocytes Absolute: 2.4 10*3/uL (ref 0.7–3.1)
Lymphs: 38 %
MCH: 32.6 pg (ref 26.6–33.0)
MCHC: 34.4 g/dL (ref 31.5–35.7)
MCV: 95 fL (ref 79–97)
Monocytes Absolute: 0.6 10*3/uL (ref 0.1–0.9)
Monocytes: 10 %
Neutrophils Absolute: 3.1 10*3/uL (ref 1.4–7.0)
Neutrophils: 47 %
Platelets: 325 10*3/uL (ref 150–450)
RBC: 3.68 x10E6/uL — ABNORMAL LOW (ref 3.77–5.28)
RDW: 11.9 % (ref 11.7–15.4)
WBC: 6.3 10*3/uL (ref 3.4–10.8)

## 2022-12-02 LAB — CMP14+EGFR
ALT: 19 IU/L (ref 0–32)
AST: 19 IU/L (ref 0–40)
Albumin/Globulin Ratio: 1.9 (ref 1.2–2.2)
Albumin: 4.2 g/dL (ref 3.7–4.7)
Alkaline Phosphatase: 79 IU/L (ref 44–121)
BUN/Creatinine Ratio: 24 (ref 12–28)
BUN: 17 mg/dL (ref 8–27)
Bilirubin Total: 0.4 mg/dL (ref 0.0–1.2)
CO2: 27 mmol/L (ref 20–29)
Calcium: 9.9 mg/dL (ref 8.7–10.3)
Chloride: 96 mmol/L (ref 96–106)
Creatinine, Ser: 0.71 mg/dL (ref 0.57–1.00)
Globulin, Total: 2.2 g/dL (ref 1.5–4.5)
Glucose: 131 mg/dL — ABNORMAL HIGH (ref 70–99)
Potassium: 3.6 mmol/L (ref 3.5–5.2)
Sodium: 137 mmol/L (ref 134–144)
Total Protein: 6.4 g/dL (ref 6.0–8.5)
eGFR: 82 mL/min/{1.73_m2} (ref >59)

## 2022-12-02 NOTE — Progress Notes (Signed)
Hello Misti,  Your lab result is normal and/or stable.Some minor variations that are not significant are commonly marked abnormal, but do not represent any medical problem for you.  Best regards, Claretta Fraise, M.D.

## 2023-01-16 ENCOUNTER — Other Ambulatory Visit: Payer: Self-pay | Admitting: Family Medicine

## 2023-01-16 DIAGNOSIS — K580 Irritable bowel syndrome with diarrhea: Secondary | ICD-10-CM

## 2023-01-16 DIAGNOSIS — E1149 Type 2 diabetes mellitus with other diabetic neurological complication: Secondary | ICD-10-CM

## 2023-02-20 ENCOUNTER — Other Ambulatory Visit: Payer: Self-pay | Admitting: Family Medicine

## 2023-03-03 ENCOUNTER — Ambulatory Visit (INDEPENDENT_AMBULATORY_CARE_PROVIDER_SITE_OTHER): Payer: Medicare Other | Admitting: Family Medicine

## 2023-03-03 ENCOUNTER — Encounter: Payer: Self-pay | Admitting: Family Medicine

## 2023-03-03 VITALS — BP 134/69 | HR 72 | Temp 97.5°F | Ht 60.0 in | Wt 142.2 lb

## 2023-03-03 DIAGNOSIS — E1149 Type 2 diabetes mellitus with other diabetic neurological complication: Secondary | ICD-10-CM | POA: Diagnosis not present

## 2023-03-03 DIAGNOSIS — I10 Essential (primary) hypertension: Secondary | ICD-10-CM

## 2023-03-03 DIAGNOSIS — E1169 Type 2 diabetes mellitus with other specified complication: Secondary | ICD-10-CM

## 2023-03-03 DIAGNOSIS — Z7984 Long term (current) use of oral hypoglycemic drugs: Secondary | ICD-10-CM

## 2023-03-03 DIAGNOSIS — R109 Unspecified abdominal pain: Secondary | ICD-10-CM | POA: Diagnosis not present

## 2023-03-03 DIAGNOSIS — E785 Hyperlipidemia, unspecified: Secondary | ICD-10-CM

## 2023-03-03 LAB — URINALYSIS
Bilirubin, UA: NEGATIVE
Glucose, UA: NEGATIVE
Nitrite, UA: NEGATIVE
Protein,UA: NEGATIVE
RBC, UA: NEGATIVE
Specific Gravity, UA: 1.02 (ref 1.005–1.030)
Urobilinogen, Ur: 1 mg/dL (ref 0.2–1.0)
pH, UA: 7 (ref 5.0–7.5)

## 2023-03-03 LAB — BAYER DCA HB A1C WAIVED: HB A1C (BAYER DCA - WAIVED): 6.3 % — ABNORMAL HIGH (ref 4.8–5.6)

## 2023-03-03 NOTE — Progress Notes (Unsigned)
Subjective:  Patient ID: Theresa Watson,  female    DOB: [birth year]  Age: 89 y.o.    CC: Medical Management of Chronic Issues   HPI Theresa Watson presents for  follow-up of hypertension. Patient has no history of headache chest pain or shortness of breath or recent cough. Patient also denies symptoms of TIA such as numbness weakness lateralizing. Patient denies side effects from medication. States taking it regularly.  Patient also  in for follow-up of elevated cholesterol. Doing well without complaints on current medication. Denies side effects  including myalgia and arthralgia and nausea. Also in today for liver function testing. Currently no chest pain, shortness of breath or other cardiovascular related symptoms noted.  Follow-up of diabetes. Patient does check blood sugar at home. Readings run between 90 fasting and 160 PP Patient denies symptoms such as excessive hunger or urinary frequency, excessive hunger, nausea No significant hypoglycemic spells noted. Medications reviewed. Pt reports taking them regularly. Pt. denies complication/adverse reaction today.    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   She has a past surgical history that includes Carpal tunnel release; Cholecystectomy; Vaginal hysterectomy; lumbar back surgery; and Eye surgery (Bilateral).   Her family history includes Arthritis in her sister, son, and son; Brain cancer in her mother; Cancer in her maternal grandfather, mother, and son; Colitis in her son; Diabetes in her son; GI Bleed in her father; Hyperlipidemia  in her son; Lymphoma in her sister; Pancreatic cancer in her brother; Suicidality in her paternal grandfather.She reports that she quit smoking about 11 years ago. Her smoking use included cigarettes. She started smoking about 69 years ago. She smoked an average of .5 packs per day. She has never used smokeless tobacco. She reports that she does not drink alcohol and does not use drugs.  Current Outpatient Medications on File Prior to Visit  Medication Sig Dispense Refill   acetaminophen (TYLENOL) 500 MG tablet Take 2 tablets (1,000 mg total) by mouth 3 (three) times daily. 180 tablet PRN   alendronate (FOSAMAX) 70 MG tablet TAKE 1 TABLET BY MOUTH ONCE  WEEKLY TAKE WITH A FULL GLASS OF WATER ON AN EMPTY STOMACH ; DO  NOT LAY DOWN FOR AT LEAST 2  HOURS 12 tablet 3   aspirin 81 MG EC tablet Take 81 mg by mouth daily.     Calcium Carb-Cholecalciferol (CALCIUM 600 + D PO) Take 1 tablet by mouth daily.      celecoxib (CELEBREX) 200 MG capsule Take 1 capsule (200 mg total) by mouth daily. With food 30 capsule 5   Cholecalciferol (VITAMIN D3) 2000 UNITS TABS Take 1 tablet by mouth daily.     donepezil (ARICEPT) 5 MG tablet TAKE 1 TABLET BY MOUTH AT BEDTIME 90 tablet 0   ferrous sulfate 325 (65 FE) MG EC tablet Take 325 mg by mouth daily with breakfast.     fexofenadine (ALLEGRA) 180 MG tablet Take 1 tablet (180 mg total) by mouth daily. For allergy symptoms 30 tablet 11   gabapentin (NEURONTIN) 300 MG capsule TAKE 1 CAPSULE BY MOUTH TWICE  DAILY 200 capsule 0   Garlic 100 MG TABS Take 1 tablet by mouth 2 (two) times daily.  geriatric multivitamins-minerals (ELDERTONIC/GEVRABON) ELIX Take 15 mLs by mouth daily.     glucose blood (ACCU-CHEK AVIVA PLUS) test strip TEST BLOOD SUGAR TWICE DAILY Dx E11.49 200 strip 3   JANUVIA 100 MG tablet TAKE 1 TABLET BY MOUTH DAILY 100 tablet 0   Omega-3 Fatty Acids (FISH OIL) 1000 MG CAPS Take 2 capsules by mouth daily.     omeprazole (PRILOSEC) 20 MG capsule TAKE 1  CAPSULE BY MOUTH DAILY 100 capsule 0   potassium chloride SA (KLOR-CON M) 20 MEQ tablet TAKE 1 TABLET BY MOUTH DAILY 100 tablet 0   pravastatin (PRAVACHOL) 80 MG tablet TAKE 1 TABLET BY MOUTH DAILY 100 tablet 0   Simethicone (GAS-X EXTRA STRENGTH) 125 MG CAPS Take 1 capsule (125 mg total) by mouth 3 (three) times daily before meals. 90 capsule 2   triamterene-hydrochlorothiazide (MAXZIDE-25) 37.5-25 MG tablet TAKE 1 TABLET BY MOUTH DAILY 100 tablet 0   vitamin C (ASCORBIC ACID) 500 MG tablet Take 500 mg by mouth daily.     VITAMIN E PO Take 1 capsule by mouth daily.     No current facility-administered medications on file prior to visit.    ROS Review of Systems  Constitutional: Negative.   HENT: Negative.    Eyes:  Negative for visual disturbance.  Respiratory:  Negative for shortness of breath.   Cardiovascular:  Negative for chest pain.  Gastrointestinal:  Negative for abdominal pain.  Genitourinary:  Positive for flank pain (left onset on Mothers day (16 days ago) when she rode to a restaurant in [place]. Pain is a moderate pain on the left. Fairly constant.).  Musculoskeletal:  Negative for arthralgias.    Objective:  BP 134/69   Pulse 72   Temp (!) 97.5 F (36.4 C)   Ht 5' (1.524 m)   Wt 142 lb 3.2 oz (64.5 kg)   SpO2 95%   BMI 27.77 kg/m   BP Readings from Last 3 Encounters:  03/03/23 134/69  12/01/22 130/70  08/27/22 130/66    Wt Readings from Last 3 Encounters:  03/03/23 142 lb 3.2 oz (64.5 kg)  12/01/22 141 lb 3.2 oz (64 kg)  08/27/22 141 lb 3.2 oz (64 kg)     Physical Exam  Diabetic Foot Exam - Simple   No data filed     Lab Results  Component Value Date   HGBA1C 6.8 (H) 12/01/2022   HGBA1C 6.2 (H) 08/27/2022   HGBA1C 6.3 (H) 04/28/2022    Assessment & Plan:   Theresa Watson was seen today for medical management of chronic issues.  Diagnoses and all orders for this visit:  Type 2 diabetes mellitus with neurological manifestations, controlled  ([facility]) -     Bayer DCA Hb A1c Waived  Hyperlipidemia associated with type 2 diabetes mellitus ([facility]) -     Lipid panel  Primary hypertension -     CBC with Differential/Platelet -     CMP14+EGFR  Flank pain, acute   I am having Theresa Watson maintain her aspirin EC, Vitamin D3, geriatric multivitamins-minerals, Fish Oil, ferrous sulfate, Garlic, Calcium Carb-Cholecalciferol (CALCIUM 600 + D PO), ascorbic acid, VITAMIN E PO, Simethicone, Accu-Chek Aviva Plus, fexofenadine, acetaminophen, alendronate, celecoxib, Januvia, pravastatin, omeprazole, gabapentin, potassium chloride SA, triamterene-hydrochlorothiazide, and donepezil.  No orders of the defined types were placed in this encounter.    Follow-up: Return in about 3 months (around 06/03/2023).  Theresa Watson, M.D.

## 2023-03-04 ENCOUNTER — Other Ambulatory Visit: Payer: Self-pay | Admitting: Family Medicine

## 2023-03-04 ENCOUNTER — Encounter: Payer: Self-pay | Admitting: Family Medicine

## 2023-03-04 LAB — LIPID PANEL
Chol/HDL Ratio: 3.7 ratio (ref 0.0–4.4)
Cholesterol, Total: 143 mg/dL (ref 100–199)
HDL: 39 mg/dL — ABNORMAL LOW (ref >39)
LDL Chol Calc (NIH): 51 mg/dL (ref 0–99)
Triglycerides: 353 mg/dL — ABNORMAL HIGH (ref 0–149)
VLDL Cholesterol Cal: 53 mg/dL — ABNORMAL HIGH (ref 5–40)

## 2023-03-04 LAB — CBC WITH DIFFERENTIAL/PLATELET
Basophils Absolute: 0 10*3/uL (ref 0.0–0.2)
Basos: 1 %
EOS (ABSOLUTE): 0.2 10*3/uL (ref 0.0–0.4)
Eos: 4 %
Hematocrit: 33.9 % — ABNORMAL LOW (ref 34.0–46.6)
Hemoglobin: 11.6 g/dL (ref 11.1–15.9)
Immature Grans (Abs): 0 10*3/uL (ref 0.0–0.1)
Immature Granulocytes: 1 %
Lymphocytes Absolute: 2.2 10*3/uL (ref 0.7–3.1)
Lymphs: 35 %
MCH: 32.2 pg (ref 26.6–33.0)
MCHC: 34.2 g/dL (ref 31.5–35.7)
MCV: 94 fL (ref 79–97)
Monocytes Absolute: 0.6 10*3/uL (ref 0.1–0.9)
Monocytes: 9 %
Neutrophils Absolute: 3.3 10*3/uL (ref 1.4–7.0)
Neutrophils: 50 %
Platelets: 324 10*3/uL (ref 150–450)
RBC: 3.6 x10E6/uL — ABNORMAL LOW (ref 3.77–5.28)
RDW: 11.5 % — ABNORMAL LOW (ref 11.7–15.4)
WBC: 6.4 10*3/uL (ref 3.4–10.8)

## 2023-03-04 LAB — CMP14+EGFR
ALT: 14 IU/L (ref 0–32)
AST: 18 IU/L (ref 0–40)
Albumin/Globulin Ratio: 2.1 (ref 1.2–2.2)
Albumin: 4.2 g/dL (ref 3.7–4.7)
Alkaline Phosphatase: 73 IU/L (ref 44–121)
BUN/Creatinine Ratio: 22 (ref 12–28)
BUN: 17 mg/dL (ref 8–27)
Bilirubin Total: 0.3 mg/dL (ref 0.0–1.2)
CO2: 25 mmol/L (ref 20–29)
Calcium: 10.2 mg/dL (ref 8.7–10.3)
Chloride: 100 mmol/L (ref 96–106)
Creatinine, Ser: 0.78 mg/dL (ref 0.57–1.00)
Globulin, Total: 2 g/dL (ref 1.5–4.5)
Glucose: 146 mg/dL — ABNORMAL HIGH (ref 70–99)
Potassium: 3.9 mmol/L (ref 3.5–5.2)
Sodium: 138 mmol/L (ref 134–144)
Total Protein: 6.2 g/dL (ref 6.0–8.5)
eGFR: 73 mL/min/{1.73_m2} (ref >59)

## 2023-03-04 MED ORDER — ROSUVASTATIN CALCIUM 40 MG PO TABS
40.0000 mg | ORAL_TABLET | Freq: Every day | ORAL | 1 refills | Status: DC
Start: 2023-03-04 — End: 2023-12-29

## 2023-03-05 ENCOUNTER — Telehealth: Payer: Self-pay | Admitting: Family Medicine

## 2023-03-05 LAB — URINE CULTURE

## 2023-03-05 NOTE — Telephone Encounter (Signed)
Spoke with daughter Britta Mccreedy and gave lab results

## 2023-03-05 NOTE — Telephone Encounter (Signed)
Patient returning call about labs. Unable to attach to previous messages

## 2023-03-20 ENCOUNTER — Other Ambulatory Visit: Payer: Self-pay | Admitting: Family Medicine

## 2023-03-20 DIAGNOSIS — E1149 Type 2 diabetes mellitus with other diabetic neurological complication: Secondary | ICD-10-CM

## 2023-03-30 ENCOUNTER — Other Ambulatory Visit: Payer: Self-pay | Admitting: Family Medicine

## 2023-03-30 DIAGNOSIS — K580 Irritable bowel syndrome with diarrhea: Secondary | ICD-10-CM

## 2023-05-03 DIAGNOSIS — R112 Nausea with vomiting, unspecified: Secondary | ICD-10-CM | POA: Diagnosis not present

## 2023-05-03 DIAGNOSIS — R079 Chest pain, unspecified: Secondary | ICD-10-CM | POA: Diagnosis not present

## 2023-05-03 DIAGNOSIS — R109 Unspecified abdominal pain: Secondary | ICD-10-CM | POA: Diagnosis not present

## 2023-05-03 DIAGNOSIS — R197 Diarrhea, unspecified: Secondary | ICD-10-CM | POA: Diagnosis not present

## 2023-05-03 DIAGNOSIS — K2970 Gastritis, unspecified, without bleeding: Secondary | ICD-10-CM | POA: Diagnosis not present

## 2023-05-21 ENCOUNTER — Other Ambulatory Visit: Payer: Self-pay | Admitting: Family Medicine

## 2023-05-23 ENCOUNTER — Other Ambulatory Visit: Payer: Self-pay | Admitting: Family Medicine

## 2023-06-03 ENCOUNTER — Ambulatory Visit (INDEPENDENT_AMBULATORY_CARE_PROVIDER_SITE_OTHER): Payer: Medicare Other | Admitting: Family Medicine

## 2023-06-03 ENCOUNTER — Encounter: Payer: Self-pay | Admitting: Family Medicine

## 2023-06-03 ENCOUNTER — Ambulatory Visit: Payer: Medicare Other | Admitting: Family Medicine

## 2023-06-03 VITALS — BP 130/73 | HR 70 | Temp 97.8°F | Ht 60.0 in | Wt 141.0 lb

## 2023-06-03 DIAGNOSIS — E785 Hyperlipidemia, unspecified: Secondary | ICD-10-CM | POA: Diagnosis not present

## 2023-06-03 DIAGNOSIS — I10 Essential (primary) hypertension: Secondary | ICD-10-CM | POA: Diagnosis not present

## 2023-06-03 DIAGNOSIS — E1149 Type 2 diabetes mellitus with other diabetic neurological complication: Secondary | ICD-10-CM

## 2023-06-03 DIAGNOSIS — K580 Irritable bowel syndrome with diarrhea: Secondary | ICD-10-CM | POA: Diagnosis not present

## 2023-06-03 DIAGNOSIS — E1169 Type 2 diabetes mellitus with other specified complication: Secondary | ICD-10-CM | POA: Diagnosis not present

## 2023-06-03 DIAGNOSIS — Z7984 Long term (current) use of oral hypoglycemic drugs: Secondary | ICD-10-CM | POA: Diagnosis not present

## 2023-06-03 DIAGNOSIS — R6889 Other general symptoms and signs: Secondary | ICD-10-CM | POA: Diagnosis not present

## 2023-06-03 LAB — CBC WITH DIFFERENTIAL/PLATELET
Basophils Absolute: 0 10*3/uL (ref 0.0–0.2)
Basos: 0 %
EOS (ABSOLUTE): 0.3 10*3/uL (ref 0.0–0.4)
Eos: 4 %
Hematocrit: 31.6 % — ABNORMAL LOW (ref 34.0–46.6)
Hemoglobin: 10.3 g/dL — ABNORMAL LOW (ref 11.1–15.9)
Immature Grans (Abs): 0 10*3/uL (ref 0.0–0.1)
Immature Granulocytes: 0 %
Lymphocytes Absolute: 1.9 10*3/uL (ref 0.7–3.1)
Lymphs: 25 %
MCH: 31.6 pg (ref 26.6–33.0)
MCHC: 32.6 g/dL (ref 31.5–35.7)
MCV: 97 fL (ref 79–97)
Monocytes Absolute: 0.7 10*3/uL (ref 0.1–0.9)
Monocytes: 9 %
Neutrophils Absolute: 4.6 10*3/uL (ref 1.4–7.0)
Neutrophils: 62 %
Platelets: 330 10*3/uL (ref 150–450)
RBC: 3.26 x10E6/uL — ABNORMAL LOW (ref 3.77–5.28)
RDW: 11.7 % (ref 11.7–15.4)
WBC: 7.5 10*3/uL (ref 3.4–10.8)

## 2023-06-03 LAB — CMP14+EGFR
ALT: 11 IU/L (ref 0–32)
AST: 17 IU/L (ref 0–40)
Albumin: 3.9 g/dL (ref 3.7–4.7)
Alkaline Phosphatase: 69 IU/L (ref 44–121)
BUN/Creatinine Ratio: 19 (ref 12–28)
BUN: 18 mg/dL (ref 8–27)
Bilirubin Total: 0.2 mg/dL (ref 0.0–1.2)
CO2: 27 mmol/L (ref 20–29)
Calcium: 9.9 mg/dL (ref 8.7–10.3)
Chloride: 98 mmol/L (ref 96–106)
Creatinine, Ser: 0.94 mg/dL (ref 0.57–1.00)
Globulin, Total: 1.8 g/dL (ref 1.5–4.5)
Glucose: 149 mg/dL — ABNORMAL HIGH (ref 70–99)
Potassium: 4 mmol/L (ref 3.5–5.2)
Sodium: 137 mmol/L (ref 134–144)
Total Protein: 5.7 g/dL — ABNORMAL LOW (ref 6.0–8.5)
eGFR: 59 mL/min/{1.73_m2} — ABNORMAL LOW (ref >59)

## 2023-06-03 LAB — LIPID PANEL
Chol/HDL Ratio: 2.4 ratio (ref 0.0–4.4)
Cholesterol, Total: 98 mg/dL — ABNORMAL LOW (ref 100–199)
HDL: 41 mg/dL (ref >39)
LDL Chol Calc (NIH): 29 mg/dL (ref 0–99)
Triglycerides: 171 mg/dL — ABNORMAL HIGH (ref 0–149)
VLDL Cholesterol Cal: 28 mg/dL (ref 5–40)

## 2023-06-03 LAB — BAYER DCA HB A1C WAIVED: HB A1C (BAYER DCA - WAIVED): 6 % — ABNORMAL HIGH (ref 4.8–5.6)

## 2023-06-03 MED ORDER — SITAGLIPTIN PHOSPHATE 100 MG PO TABS
100.0000 mg | ORAL_TABLET | Freq: Every day | ORAL | 3 refills | Status: DC
Start: 2023-06-03 — End: 2023-06-09

## 2023-06-03 MED ORDER — GABAPENTIN 300 MG PO CAPS
ORAL_CAPSULE | ORAL | 3 refills | Status: DC
Start: 2023-06-03 — End: 2023-06-09

## 2023-06-03 MED ORDER — TRIAMTERENE-HCTZ 37.5-25 MG PO TABS: 1.0000 | ORAL_TABLET | Freq: Every day | ORAL | 3 refills | Status: DC

## 2023-06-03 MED ORDER — OMEPRAZOLE 20 MG PO CPDR
20.0000 mg | DELAYED_RELEASE_CAPSULE | Freq: Every day | ORAL | 3 refills | Status: DC
Start: 2023-06-03 — End: 2023-06-09

## 2023-06-03 MED ORDER — POTASSIUM CHLORIDE CRYS ER 20 MEQ PO TBCR: 20.0000 meq | EXTENDED_RELEASE_TABLET | Freq: Every day | ORAL | 3 refills | Status: DC

## 2023-06-03 NOTE — Progress Notes (Signed)
Subjective:  Patient ID: Theresa Watson,  female    DOB: September 24, 1933  Age: 89 y.o.    CC: Medical Management of Chronic Issues   HPI Shon GENEVIA VOLPE presents for  follow-up of hypertension. Patient has no history of headache chest pain or shortness of breath or recent cough. Patient also denies symptoms of TIA such as numbness weakness lateralizing. Patient denies side effects from medication. States taking it regularly.  Patient also  in for follow-up of elevated cholesterol. Doing well without complaints on current medication. Denies side effects  including myalgia and arthralgia and nausea. Also in today for liver function testing. Currently no chest pain, shortness of breath or other cardiovascular related symptoms noted.  Follow-up of diabetes. Patient does check blood sugar at home. Can't remember the readings and forgat the log sheets. Can be over 200 at night. Down by morning.  Patient denies symptoms such as excessive hunger or urinary frequency, excessive hunger, nausea No significant hypoglycemic spells noted. Medications reviewed. Pt reports taking them regularly. Pt. denies complication/adverse reaction today.    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   She has a past surgical history that includes Carpal tunnel release; Cholecystectomy; Vaginal hysterectomy; lumbar back surgery; and Eye surgery (Bilateral).   Her family history includes Arthritis in her sister, son, and son; Brain cancer in her mother; Cancer in her maternal grandfather, mother, and son; Colitis in her son;  Diabetes in her son; GI Bleed in her father; Hyperlipidemia in her son; Lymphoma in her sister; Pancreatic cancer in her brother; Suicidality in her paternal grandfather.She reports that she quit smoking about 11 years ago. Her smoking use included cigarettes. She started smoking about 69 years ago. She has a 29.2 pack-year smoking history. She has never used smokeless tobacco. She reports that she does not drink alcohol and does not use drugs.  Current Outpatient Medications on File Prior to Visit  Medication Sig Dispense Refill   acetaminophen (TYLENOL) 500 MG tablet Take 2 tablets (1,000 mg total) by mouth 3 (three) times daily. 180 tablet PRN   alendronate (FOSAMAX) 70 MG tablet TAKE 1 TABLET BY MOUTH ONCE  WEEKLY TAKE WITH A FULL GLASS OF WATER ON AN EMPTY STOMACH ; DO  NOT LAY DOWN FOR AT LEAST 2  HOURS 12 tablet 3   aspirin 81 MG EC tablet Take 81 mg by mouth daily.     Calcium Carb-Cholecalciferol (CALCIUM 600 + D PO) Take 1 tablet by mouth daily.      celecoxib (CELEBREX) 200 MG capsule TAKE 1 CAPSULE BY MOUTH ONCE DAILY WITH FOOD 30 capsule 0   Cholecalciferol (VITAMIN D3) 2000 UNITS TABS Take 1 tablet by mouth daily.     donepezil (ARICEPT) 5 MG tablet TAKE 1 TABLET BY MOUTH AT BEDTIME 90 tablet 0   ferrous sulfate 325 (65 FE) MG EC tablet Take 325 mg by mouth daily with breakfast.     fexofenadine (ALLEGRA) 180 MG tablet Take 1 tablet (180 mg total) by mouth daily. For allergy symptoms 30 tablet 11   Garlic 100 MG TABS Take 1 tablet by mouth 2 (two) times daily.     geriatric multivitamins-minerals (ELDERTONIC/GEVRABON) ELIX Take 15 mLs  by mouth daily.     glucose blood (ACCU-CHEK AVIVA PLUS) test strip TEST BLOOD SUGAR TWICE DAILY Dx E11.49 200 strip 3   Omega-3 Fatty Acids (FISH OIL) 1000 MG CAPS Take 2 capsules by mouth daily.     rosuvastatin (CRESTOR) 40 MG tablet Take 1 tablet (40 mg total) by mouth daily. For cholesterol 90 tablet 1   Simethicone (GAS-X EXTRA STRENGTH) 125 MG  CAPS Take 1 capsule (125 mg total) by mouth 3 (three) times daily before meals. 90 capsule 2   vitamin C (ASCORBIC ACID) 500 MG tablet Take 500 mg by mouth daily.     VITAMIN E PO Take 1 capsule by mouth daily.     No current facility-administered medications on file prior to visit.    ROS Review of Systems  Constitutional: Negative.   HENT: Negative.    Eyes:  Negative for visual disturbance.  Respiratory:  Negative for shortness of breath.   Cardiovascular:  Negative for chest pain.  Gastrointestinal:  Positive for diarrhea and vomiting (resolved last week. Only lasted a day.). Negative for abdominal pain.  Musculoskeletal:  Positive for arthralgias (right knee pain. Gets tinglin up and down the legs).    Objective:  BP 130/73   Pulse 70   Temp 97.8 F (36.6 C)   Ht 5' (1.524 m)   Wt 141 lb (64 kg)   SpO2 96%   BMI 27.54 kg/m   BP Readings from Last 3 Encounters:  06/03/23 130/73  03/03/23 134/69  12/01/22 130/70    Wt Readings from Last 3 Encounters:  06/03/23 141 lb (64 kg)  03/03/23 142 lb 3.2 oz (64.5 kg)  12/01/22 141 lb 3.2 oz (64 kg)     Physical Exam Constitutional:      General: She is not in acute distress.    Appearance: She is well-developed.  Cardiovascular:     Rate and Rhythm: Normal rate and regular rhythm.  Pulmonary:     Breath sounds: Normal breath sounds.  Musculoskeletal:        General: Normal range of motion.  Skin:    General: Skin is warm and dry.  Neurological:     Mental Status: She is alert and oriented to person, place, and time.     Diabetic Foot Exam - Simple   No data filed     Lab Results  Component Value Date   HGBA1C 6.0 (H) 06/03/2023   HGBA1C 6.3 (H) 03/03/2023   HGBA1C 6.8 (H) 12/01/2022    Assessment & Plan:   Rochella was seen today for medical management of chronic issues.  Diagnoses and all orders for this visit:  Type 2 diabetes mellitus with neurological manifestations, controlled (HCC) -      Bayer DCA Hb A1c Waived -     gabapentin (NEURONTIN) 300 MG capsule; Take one in the morning and two in the evening. -     sitaGLIPtin (JANUVIA) 100 MG tablet; Take 1 tablet (100 mg total) by mouth daily.  Hyperlipidemia associated with type 2 diabetes mellitus (HCC) -     Lipid panel  Primary hypertension -     CBC with Differential/Platelet -     CMP14+EGFR  Irritable bowel syndrome with diarrhea -     omeprazole (PRILOSEC) 20 MG capsule; Take 1 capsule (20 mg total) by mouth daily.  Other orders -     potassium chloride SA (KLOR-CON M) 20 MEQ tablet; Take 1 tablet (20 mEq total) by mouth daily. -  triamterene-hydrochlorothiazide (MAXZIDE-25) 37.5-25 MG tablet; Take 1 tablet by mouth daily.   I have changed Laynee M. Macomber's Januvia to sitaGLIPtin. I have also changed her gabapentin, omeprazole, and potassium chloride SA. I am also having her maintain her aspirin EC, Vitamin D3, geriatric multivitamins-minerals, Fish Oil, ferrous sulfate, Garlic, Calcium Carb-Cholecalciferol (CALCIUM 600 + D PO), ascorbic acid, VITAMIN E PO, Simethicone, Accu-Chek Aviva Plus, fexofenadine, acetaminophen, alendronate, rosuvastatin, donepezil, celecoxib, and triamterene-hydrochlorothiazide.  Meds ordered this encounter  Medications   gabapentin (NEURONTIN) 300 MG capsule    Sig: Take one in the morning and two in the evening.    Dispense:  300 capsule    Refill:  3    Please send a replace/new response with 100-Day Supply if appropriate to maximize member benefit. Requesting 1 year supply.   sitaGLIPtin (JANUVIA) 100 MG tablet    Sig: Take 1 tablet (100 mg total) by mouth daily.    Dispense:  100 tablet    Refill:  3    Please send a replace/new response with 100-Day Supply if appropriate to maximize member benefit. Requesting 1 year supply.   omeprazole (PRILOSEC) 20 MG capsule    Sig: Take 1 capsule (20 mg total) by mouth daily.    Dispense:  100 capsule    Refill:  3    Please send a  replace/new response with 100-Day Supply if appropriate to maximize member benefit. Requesting 1 year supply.   potassium chloride SA (KLOR-CON M) 20 MEQ tablet    Sig: Take 1 tablet (20 mEq total) by mouth daily.    Dispense:  100 tablet    Refill:  3    Please send a replace/new response with 100-Day Supply if appropriate to maximize member benefit. Requesting 1 year supply.   triamterene-hydrochlorothiazide (MAXZIDE-25) 37.5-25 MG tablet    Sig: Take 1 tablet by mouth daily.    Dispense:  100 tablet    Refill:  3    Please send a replace/new response with 100-Day Supply if appropriate to maximize member benefit. Requesting 1 year supply.     Follow-up: Return in about 3 months (around 09/03/2023).  Mechele Claude, M.D.

## 2023-06-03 NOTE — Patient Instructions (Signed)
Take Gas Ex before each meal and at bedtime

## 2023-06-06 LAB — SPECIMEN STATUS REPORT

## 2023-06-06 LAB — FERRITIN: Ferritin: 891 ng/mL — ABNORMAL HIGH (ref 15–150)

## 2023-06-06 LAB — IRON AND TIBC
Iron Saturation: 25 % (ref 15–55)
Iron: 51 ug/dL (ref 27–139)
Total Iron Binding Capacity: 206 ug/dL — ABNORMAL LOW (ref 250–450)
UIBC: 155 ug/dL (ref 118–369)

## 2023-06-06 LAB — FOLATE: Folate: >20 ng/mL (ref >3.0)

## 2023-06-06 LAB — VITAMIN B12: Vitamin B-12: 523 pg/mL (ref 232–1245)

## 2023-06-08 ENCOUNTER — Other Ambulatory Visit: Payer: Self-pay | Admitting: Family Medicine

## 2023-06-08 DIAGNOSIS — K580 Irritable bowel syndrome with diarrhea: Secondary | ICD-10-CM

## 2023-06-08 DIAGNOSIS — R7989 Other specified abnormal findings of blood chemistry: Secondary | ICD-10-CM

## 2023-06-08 DIAGNOSIS — E1149 Type 2 diabetes mellitus with other diabetic neurological complication: Secondary | ICD-10-CM

## 2023-06-09 NOTE — Telephone Encounter (Signed)
Remaining refills sent to mail order pharmacy.

## 2023-06-11 ENCOUNTER — Telehealth: Payer: Self-pay | Admitting: Family Medicine

## 2023-06-11 NOTE — Telephone Encounter (Signed)
Theresa Watson is concerned about iron labs results being too high   She also noticed that her medication list in MyChart did not show that she is taking an over the counter  iron pill daily  Thank you,  Judeth Cornfield,  AMB Clinical Support [facility] AW[facility] Program Direct Dial ??[phone]

## 2023-06-11 NOTE — Telephone Encounter (Signed)
Med lists do not usually include over the counter meds. However. I recommend for now she not take it. She needs to see hematology to see if there is a problem or not since the ferritin is high.

## 2023-06-12 NOTE — Telephone Encounter (Signed)
Patients daughter notified and verbalized understanding. States that mom has been taking OTC iron for years. Advised to hold iron until appt with hematology and let them decide from there. Patients daughter agreed

## 2023-06-24 ENCOUNTER — Other Ambulatory Visit: Payer: Self-pay | Admitting: Family Medicine

## 2023-06-26 ENCOUNTER — Encounter: Payer: Self-pay | Admitting: Oncology

## 2023-06-26 ENCOUNTER — Inpatient Hospital Stay: Payer: Medicare Other | Attending: Oncology | Admitting: Oncology

## 2023-06-26 ENCOUNTER — Inpatient Hospital Stay: Payer: Medicare Other | Admitting: *Deleted

## 2023-06-26 VITALS — BP 109/53 | HR 80 | Temp 97.5°F | Resp 16 | Wt 136.2 lb

## 2023-06-26 DIAGNOSIS — R7989 Other specified abnormal findings of blood chemistry: Secondary | ICD-10-CM

## 2023-06-26 DIAGNOSIS — Z803 Family history of malignant neoplasm of breast: Secondary | ICD-10-CM | POA: Insufficient documentation

## 2023-06-26 DIAGNOSIS — D649 Anemia, unspecified: Secondary | ICD-10-CM | POA: Diagnosis not present

## 2023-06-26 DIAGNOSIS — Z87891 Personal history of nicotine dependence: Secondary | ICD-10-CM | POA: Diagnosis not present

## 2023-06-26 DIAGNOSIS — Z800 Family history of malignant neoplasm of digestive organs: Secondary | ICD-10-CM | POA: Insufficient documentation

## 2023-06-26 LAB — CBC WITH DIFFERENTIAL/PLATELET
Abs Immature Granulocytes: 0.07 10*3/uL (ref 0.00–0.07)
Basophils Absolute: 0.1 10*3/uL (ref 0.0–0.1)
Basophils Relative: 0 %
Eosinophils Absolute: 0.3 10*3/uL (ref 0.0–0.5)
Eosinophils Relative: 2 %
HCT: 34.6 % — ABNORMAL LOW (ref 36.0–46.0)
Hemoglobin: 11.2 g/dL — ABNORMAL LOW (ref 12.0–15.0)
Immature Granulocytes: 1 %
Lymphocytes Relative: 18 %
Lymphs Abs: 2.3 10*3/uL (ref 0.7–4.0)
MCH: 31.8 pg (ref 26.0–34.0)
MCHC: 32.4 g/dL (ref 30.0–36.0)
MCV: 98.3 fL (ref 80.0–100.0)
Monocytes Absolute: 1 10*3/uL (ref 0.1–1.0)
Monocytes Relative: 8 %
Neutro Abs: 9.4 10*3/uL — ABNORMAL HIGH (ref 1.7–7.7)
Neutrophils Relative %: 71 %
Platelets: 314 10*3/uL (ref 150–400)
RBC: 3.52 MIL/uL — ABNORMAL LOW (ref 3.87–5.11)
RDW: 12.3 % (ref 11.5–15.5)
WBC: 13.2 10*3/uL — ABNORMAL HIGH (ref 4.0–10.5)
nRBC: 0 % (ref 0.0–0.2)

## 2023-06-26 LAB — IRON AND TIBC
Iron: 45 ug/dL (ref 28–170)
Saturation Ratios: 16 % (ref 10.4–31.8)
TIBC: 283 ug/dL (ref 250–450)
UIBC: 238 ug/dL

## 2023-06-26 LAB — RETICULOCYTES
Immature Retic Fract: 15.3 % (ref 2.3–15.9)
RBC.: 3.42 MIL/uL — ABNORMAL LOW (ref 3.87–5.11)
Retic Count, Absolute: 80.7 10*3/uL (ref 19.0–186.0)
Retic Ct Pct: 2.4 % (ref 0.4–3.1)

## 2023-06-26 LAB — FERRITIN: Ferritin: 499 ng/mL — ABNORMAL HIGH (ref 11–307)

## 2023-06-26 LAB — C-REACTIVE PROTEIN: CRP: 0.6 mg/dL (ref <1.0)

## 2023-06-26 LAB — SEDIMENTATION RATE: Sed Rate: 36 mm/hr — ABNORMAL HIGH (ref 0–22)

## 2023-06-26 NOTE — Assessment & Plan Note (Signed)
-  Patient has normocytic anemia likely secondary to chronic inflammation -No acute signs of bleeding or concerns of iron deficiency -Continue to monitor

## 2023-06-26 NOTE — Assessment & Plan Note (Signed)
-  Patient has elevated ferritin likely secondary to chronic inflammation -Ferritin level is 499 today decreased from prior likely consistent with discontinuing iron supplements -Patient does not meet criteria for iron overload testing at this time -Patient is asymptomatic -HFE testing sent  Return to clinic in 1 month to discuss results

## 2023-06-26 NOTE — Progress Notes (Signed)
[facility] HEMATOLOGY NEW VISIT  Mechele Claude, MD  REASON FOR REFERRAL: Elevated ferritin  SUMMARY OF HEMATOLOGIC HISTORY: Lab Results  Component Value Date   IRON 45 06/26/2023   TIBC 283 06/26/2023   FERRITIN 499 (H) 06/26/2023    Latest Reference Range & Units 03/24/19 11:55 03/25/19 08:47 06/03/23 10:19  Ferritin 15 - 150 ng/mL 717 (H) 1,064 (H) 891 (H)  (H): Data is abnormally high   HISTORY OF PRESENT ILLNESS: Theresa Watson 89 y.o. female referred by primary care for elevated ferritin.  She is accompanied by her son and daughter-in-law.  She has a past medical history of diabetes, osteoporosis, alpha-gal syndrome.  Patient has no complaints today and denies fatigue, rash, abdominal pain, fever, chills, loss of appetite, weight loss.  On review of her labs she has had elevated ferritin levels since 2020 at least.  Patient stated that she has been taking iron supplements and to stop taking them last week.  She is overall of good health and has no other complaints today.  Patient is a former smoker but quit 30 years ago, no alcohol use.  Lives alone, has family living close by who brings her food.  She walks with a walker at home.  She has a brother who had colon cancer and sister with breast cancer.  No known history of blood disorders.  We discussed that ferritin is an inflammatory marker and can be increased for various reasons.  She has normocytic anemia.  Discussed testing for hemochromatosis and inflammatory markers and repeating labs today.  All the questions and concerns were answered  I have reviewed the past medical history, past surgical history, social history and family history with the patient   ALLERGIES:  is allergic to levaquin [levofloxacin], cephalexin, metformin and related, penicillins, and lisinopril.  MEDICATIONS:  Current Outpatient Medications  Medication Sig Dispense Refill   acetaminophen (TYLENOL) 500 MG  tablet Take 2 tablets (1,000 mg total) by mouth 3 (three) times daily. 180 tablet PRN   alendronate (FOSAMAX) 70 MG tablet TAKE 1 TABLET BY MOUTH ONCE  WEEKLY TAKE WITH A FULL GLASS OF WATER ON AN EMPTY STOMACH ; DO  NOT LAY DOWN FOR AT LEAST 2  HOURS 12 tablet 3   aspirin 81 MG EC tablet Take 81 mg by mouth daily.     Calcium Carb-Cholecalciferol (CALCIUM 600 + D PO) Take 1 tablet by mouth daily.      celecoxib (CELEBREX) 200 MG capsule TAKE 1 CAPSULE BY MOUTH ONCE DAILY WITH FOOD 30 capsule 5   Cholecalciferol (VITAMIN D3) 2000 UNITS TABS Take 1 tablet by mouth daily.     donepezil (ARICEPT) 5 MG tablet TAKE 1 TABLET BY MOUTH AT BEDTIME 90 tablet 0   fexofenadine (ALLEGRA) 180 MG tablet Take 1 tablet (180 mg total) by mouth daily. For allergy symptoms 30 tablet 11   gabapentin (NEURONTIN) 300 MG capsule TAKE 1 CAPSULE BY MOUTH TWICE  DAILY 200 capsule 2   Garlic 100 MG TABS Take 1 tablet by mouth 2 (two) times daily.     geriatric multivitamins-minerals (ELDERTONIC/GEVRABON) ELIX Take 15 mLs by mouth daily.     glucose blood (ACCU-CHEK AVIVA PLUS) test strip TEST BLOOD SUGAR TWICE DAILY Dx E11.49 200 strip 3   JANUVIA 100 MG tablet TAKE 1 TABLET BY MOUTH DAILY 100 tablet 2   Omega-3 Fatty Acids (FISH OIL) 1000 MG CAPS Take 2 capsules by mouth daily.  omeprazole (PRILOSEC) 20 MG capsule TAKE 1 CAPSULE BY MOUTH DAILY 100 capsule 2   potassium chloride SA (KLOR-CON M) 20 MEQ tablet TAKE 1 TABLET BY MOUTH DAILY 100 tablet 2   rosuvastatin (CRESTOR) 40 MG tablet Take 1 tablet (40 mg total) by mouth daily. For cholesterol 90 tablet 1   Simethicone (GAS-X EXTRA STRENGTH) 125 MG CAPS Take 1 capsule (125 mg total) by mouth 3 (three) times daily before meals. 90 capsule 2   triamterene-hydrochlorothiazide (MAXZIDE-25) 37.5-25 MG tablet TAKE 1 TABLET BY MOUTH DAILY 100 tablet 2   vitamin C (ASCORBIC ACID) 500 MG tablet Take 500 mg by mouth daily.     VITAMIN E PO Take 1 capsule by mouth daily.     No  current facility-administered medications for this visit.     REVIEW OF SYSTEMS:   Constitutional: Denies fevers, chills or night sweats Eyes: Denies blurriness of vision Ears, nose, mouth, throat, and face: Denies mucositis or sore throat Respiratory: Denies cough, dyspnea or wheezes Cardiovascular: Denies palpitation, chest discomfort or lower extremity swelling Gastrointestinal:  Denies nausea, heartburn or change in bowel habits Skin: Denies abnormal skin rashes Lymphatics: Denies new lymphadenopathy or easy bruising Neurological:Denies numbness, tingling or new weaknesses Behavioral/Psych: Mood is stable, no new changes  All other systems were reviewed with the patient and are negative.  PHYSICAL EXAMINATION:   Vitals:   06/26/23 1118  BP: (!) 109/53  Pulse: 80  Resp: 16  Temp: (!) 97.5 F (36.4 C)  SpO2: 95%    GENERAL:alert, no distress and comfortable SKIN: skin color, texture, turgor are normal, no rashes or significant lesions LUNGS: clear to auscultation and percussion with normal breathing effort HEART: regular rate & rhythm and no murmurs and no lower extremity edema ABDOMEN:abdomen soft, non-tender and normal bowel sounds Musculoskeletal:no cyanosis of digits and no clubbing  NEURO: alert & oriented x 3 with fluent speech, no focal motor/sensory deficits  LABORATORY DATA:  I have reviewed the data as listed  Lab Results  Component Value Date   WBC 13.2 (H) 06/26/2023   NEUTROABS 9.4 (H) 06/26/2023   HGB 11.2 (L) 06/26/2023   HCT 34.6 (L) 06/26/2023   MCV 98.3 06/26/2023   PLT 314 06/26/2023      Component Value Date/Time   NA 137 06/03/2023 1019   K 4.0 06/03/2023 1019   CL 98 06/03/2023 1019   CO2 27 06/03/2023 1019   GLUCOSE 149 (H) 06/03/2023 1019   GLUCOSE 137 (H) 12/28/2019 1818   BUN 18 06/03/2023 1019   CREATININE 0.94 06/03/2023 1019   CREATININE 0.81 03/16/2013 0902   CALCIUM 9.9 06/03/2023 1019   PROT 5.7 (L) 06/03/2023 1019    ALBUMIN 3.9 06/03/2023 1019   AST 17 06/03/2023 1019   ALT 11 06/03/2023 1019   ALKPHOS 69 06/03/2023 1019   BILITOT 0.2 06/03/2023 1019   GFRNONAA 57 (L) 09/14/2020 1112   GFRNONAA 70 03/16/2013 0902   GFRAA 66 09/14/2020 1112   GFRAA 81 03/16/2013 0902       Chemistry      Component Value Date/Time   NA 137 06/03/2023 1019   K 4.0 06/03/2023 1019   CL 98 06/03/2023 1019   CO2 27 06/03/2023 1019   BUN 18 06/03/2023 1019   CREATININE 0.94 06/03/2023 1019   CREATININE 0.81 03/16/2013 0902      Component Value Date/Time   CALCIUM 9.9 06/03/2023 1019   ALKPHOS 69 06/03/2023 1019   AST 17 06/03/2023 1019  ALT 11 06/03/2023 1019   BILITOT 0.2 06/03/2023 1019     Lab Results  Component Value Date   IRON 45 06/26/2023   TIBC 283 06/26/2023   FERRITIN 499 (H) 06/26/2023    ASSESSMENT & PLAN:  Patient is an 89 year old female referred for hyper ferritinemia   Elevated ferritin -Patient has elevated ferritin likely secondary to chronic inflammation -Ferritin level is 499 today decreased from prior likely consistent with discontinuing iron supplements -Patient does not meet criteria for iron overload testing at this time -Patient is asymptomatic -HFE testing sent  Return to clinic in 1 month to discuss results  Anemia -Patient has normocytic anemia likely secondary to chronic inflammation -No acute signs of bleeding or concerns of iron deficiency -Continue to monitor   Orders Placed This Encounter  Procedures   CBC with Differential/Platelet    Standing Status:   Future    Number of Occurrences:   1    Standing Expiration Date:   06/25/2024   Ferritin    Standing Status:   Future    Number of Occurrences:   1    Standing Expiration Date:   06/25/2024   Iron and TIBC    Standing Status:   Future    Number of Occurrences:   1    Standing Expiration Date:   06/25/2024   HFE-Associated Hereditary Hemochromatosis (COHESION)    Standing Status:   Future    Number  of Occurrences:   1    Standing Expiration Date:   06/25/2024   Sedimentation rate    Standing Status:   Future    Number of Occurrences:   1    Standing Expiration Date:   06/25/2024   C-reactive protein    Standing Status:   Future    Number of Occurrences:   1    Standing Expiration Date:   06/25/2024   Reticulocytes    Standing Status:   Future    Number of Occurrences:   1    Standing Expiration Date:   06/25/2024    The total time spent in the appointment was 40 minutes encounter with patients including review of chart and various tests results, discussions about plan of care and coordination of care plan   All questions were answered. The patient knows to call the clinic with any problems, questions or concerns. No barriers to learning was detected.   Cindie Crumbly, MD 9/20/20243:06 PM

## 2023-07-03 ENCOUNTER — Other Ambulatory Visit: Payer: Self-pay | Admitting: Family Medicine

## 2023-07-07 DIAGNOSIS — Z9181 History of falling: Secondary | ICD-10-CM | POA: Diagnosis not present

## 2023-07-07 DIAGNOSIS — M6281 Muscle weakness (generalized): Secondary | ICD-10-CM | POA: Diagnosis not present

## 2023-07-07 DIAGNOSIS — E119 Type 2 diabetes mellitus without complications: Secondary | ICD-10-CM | POA: Diagnosis not present

## 2023-07-07 DIAGNOSIS — K5790 Diverticulosis of intestine, part unspecified, without perforation or abscess without bleeding: Secondary | ICD-10-CM | POA: Diagnosis not present

## 2023-07-07 DIAGNOSIS — Z711 Person with feared health complaint in whom no diagnosis is made: Secondary | ICD-10-CM | POA: Diagnosis not present

## 2023-07-07 DIAGNOSIS — W1839XA Other fall on same level, initial encounter: Secondary | ICD-10-CM | POA: Diagnosis not present

## 2023-07-07 DIAGNOSIS — K589 Irritable bowel syndrome without diarrhea: Secondary | ICD-10-CM | POA: Diagnosis not present

## 2023-07-07 DIAGNOSIS — M1611 Unilateral primary osteoarthritis, right hip: Secondary | ICD-10-CM | POA: Diagnosis not present

## 2023-07-07 DIAGNOSIS — R531 Weakness: Secondary | ICD-10-CM | POA: Diagnosis not present

## 2023-07-07 DIAGNOSIS — K219 Gastro-esophageal reflux disease without esophagitis: Secondary | ICD-10-CM | POA: Diagnosis not present

## 2023-07-07 DIAGNOSIS — M25561 Pain in right knee: Secondary | ICD-10-CM | POA: Diagnosis not present

## 2023-07-07 DIAGNOSIS — R296 Repeated falls: Secondary | ICD-10-CM | POA: Diagnosis not present

## 2023-07-07 DIAGNOSIS — R109 Unspecified abdominal pain: Secondary | ICD-10-CM | POA: Diagnosis not present

## 2023-07-07 DIAGNOSIS — T796XXA Traumatic ischemia of muscle, initial encounter: Secondary | ICD-10-CM | POA: Diagnosis not present

## 2023-07-07 DIAGNOSIS — E785 Hyperlipidemia, unspecified: Secondary | ICD-10-CM | POA: Diagnosis not present

## 2023-07-07 DIAGNOSIS — E1140 Type 2 diabetes mellitus with diabetic neuropathy, unspecified: Secondary | ICD-10-CM | POA: Diagnosis not present

## 2023-07-07 DIAGNOSIS — M7989 Other specified soft tissue disorders: Secondary | ICD-10-CM | POA: Diagnosis not present

## 2023-07-07 DIAGNOSIS — M6282 Rhabdomyolysis: Secondary | ICD-10-CM | POA: Diagnosis not present

## 2023-07-07 DIAGNOSIS — Z91014 Allergy to mammalian meats: Secondary | ICD-10-CM | POA: Diagnosis not present

## 2023-07-07 DIAGNOSIS — N179 Acute kidney failure, unspecified: Secondary | ICD-10-CM | POA: Diagnosis not present

## 2023-07-07 DIAGNOSIS — G4700 Insomnia, unspecified: Secondary | ICD-10-CM | POA: Diagnosis not present

## 2023-07-07 DIAGNOSIS — S50811A Abrasion of right forearm, initial encounter: Secondary | ICD-10-CM | POA: Diagnosis not present

## 2023-07-07 DIAGNOSIS — E876 Hypokalemia: Secondary | ICD-10-CM | POA: Diagnosis not present

## 2023-07-07 DIAGNOSIS — Z91018 Allergy to other foods: Secondary | ICD-10-CM | POA: Diagnosis not present

## 2023-07-07 DIAGNOSIS — Z880 Allergy status to penicillin: Secondary | ICD-10-CM | POA: Diagnosis not present

## 2023-07-07 DIAGNOSIS — U071 COVID-19: Secondary | ICD-10-CM | POA: Diagnosis not present

## 2023-07-07 DIAGNOSIS — M47815 Spondylosis without myelopathy or radiculopathy, thoracolumbar region: Secondary | ICD-10-CM | POA: Diagnosis not present

## 2023-07-07 DIAGNOSIS — G8929 Other chronic pain: Secondary | ICD-10-CM | POA: Diagnosis not present

## 2023-07-07 DIAGNOSIS — M25569 Pain in unspecified knee: Secondary | ICD-10-CM | POA: Diagnosis not present

## 2023-07-07 DIAGNOSIS — K5730 Diverticulosis of large intestine without perforation or abscess without bleeding: Secondary | ICD-10-CM | POA: Diagnosis not present

## 2023-07-07 DIAGNOSIS — Z043 Encounter for examination and observation following other accident: Secondary | ICD-10-CM | POA: Diagnosis not present

## 2023-07-07 DIAGNOSIS — S0990XA Unspecified injury of head, initial encounter: Secondary | ICD-10-CM | POA: Diagnosis not present

## 2023-07-07 DIAGNOSIS — M25511 Pain in right shoulder: Secondary | ICD-10-CM | POA: Diagnosis not present

## 2023-07-07 DIAGNOSIS — S80212A Abrasion, left knee, initial encounter: Secondary | ICD-10-CM | POA: Diagnosis not present

## 2023-07-07 DIAGNOSIS — I081 Rheumatic disorders of both mitral and tricuspid valves: Secondary | ICD-10-CM | POA: Diagnosis not present

## 2023-07-07 DIAGNOSIS — I10 Essential (primary) hypertension: Secondary | ICD-10-CM | POA: Diagnosis not present

## 2023-07-07 DIAGNOSIS — W1830XA Fall on same level, unspecified, initial encounter: Secondary | ICD-10-CM | POA: Diagnosis not present

## 2023-07-07 DIAGNOSIS — M25562 Pain in left knee: Secondary | ICD-10-CM | POA: Diagnosis not present

## 2023-07-07 DIAGNOSIS — Z888 Allergy status to other drugs, medicaments and biological substances status: Secondary | ICD-10-CM | POA: Diagnosis not present

## 2023-07-07 DIAGNOSIS — Z66 Do not resuscitate: Secondary | ICD-10-CM | POA: Diagnosis not present

## 2023-07-08 DIAGNOSIS — I081 Rheumatic disorders of both mitral and tricuspid valves: Secondary | ICD-10-CM | POA: Diagnosis not present

## 2023-07-09 LAB — HEMOCHROMATOSIS DNA-PCR(C282Y,H63D)

## 2023-07-10 DIAGNOSIS — Z888 Allergy status to other drugs, medicaments and biological substances status: Secondary | ICD-10-CM | POA: Diagnosis not present

## 2023-07-10 DIAGNOSIS — R296 Repeated falls: Secondary | ICD-10-CM | POA: Diagnosis not present

## 2023-07-10 DIAGNOSIS — R339 Retention of urine, unspecified: Secondary | ICD-10-CM | POA: Diagnosis not present

## 2023-07-10 DIAGNOSIS — R140 Abdominal distension (gaseous): Secondary | ICD-10-CM | POA: Diagnosis not present

## 2023-07-10 DIAGNOSIS — M1611 Unilateral primary osteoarthritis, right hip: Secondary | ICD-10-CM | POA: Diagnosis not present

## 2023-07-10 DIAGNOSIS — M6281 Muscle weakness (generalized): Secondary | ICD-10-CM | POA: Diagnosis not present

## 2023-07-10 DIAGNOSIS — N390 Urinary tract infection, site not specified: Secondary | ICD-10-CM | POA: Diagnosis not present

## 2023-07-10 DIAGNOSIS — E876 Hypokalemia: Secondary | ICD-10-CM | POA: Diagnosis not present

## 2023-07-10 DIAGNOSIS — K219 Gastro-esophageal reflux disease without esophagitis: Secondary | ICD-10-CM | POA: Diagnosis not present

## 2023-07-10 DIAGNOSIS — R000 Tachycardia, unspecified: Secondary | ICD-10-CM | POA: Diagnosis not present

## 2023-07-10 DIAGNOSIS — E119 Type 2 diabetes mellitus without complications: Secondary | ICD-10-CM | POA: Diagnosis not present

## 2023-07-10 DIAGNOSIS — Z87891 Personal history of nicotine dependence: Secondary | ICD-10-CM | POA: Diagnosis not present

## 2023-07-10 DIAGNOSIS — G8929 Other chronic pain: Secondary | ICD-10-CM | POA: Diagnosis not present

## 2023-07-10 DIAGNOSIS — Z9049 Acquired absence of other specified parts of digestive tract: Secondary | ICD-10-CM | POA: Diagnosis not present

## 2023-07-10 DIAGNOSIS — U071 COVID-19: Secondary | ICD-10-CM | POA: Diagnosis not present

## 2023-07-10 DIAGNOSIS — N3289 Other specified disorders of bladder: Secondary | ICD-10-CM | POA: Diagnosis not present

## 2023-07-10 DIAGNOSIS — R1084 Generalized abdominal pain: Secondary | ICD-10-CM | POA: Diagnosis not present

## 2023-07-10 DIAGNOSIS — E785 Hyperlipidemia, unspecified: Secondary | ICD-10-CM | POA: Diagnosis not present

## 2023-07-10 DIAGNOSIS — Z9181 History of falling: Secondary | ICD-10-CM | POA: Diagnosis not present

## 2023-07-10 DIAGNOSIS — M25561 Pain in right knee: Secondary | ICD-10-CM | POA: Diagnosis not present

## 2023-07-10 DIAGNOSIS — M6282 Rhabdomyolysis: Secondary | ICD-10-CM | POA: Diagnosis not present

## 2023-07-10 DIAGNOSIS — Z880 Allergy status to penicillin: Secondary | ICD-10-CM | POA: Diagnosis not present

## 2023-07-10 DIAGNOSIS — M47815 Spondylosis without myelopathy or radiculopathy, thoracolumbar region: Secondary | ICD-10-CM | POA: Diagnosis not present

## 2023-07-10 DIAGNOSIS — R531 Weakness: Secondary | ICD-10-CM | POA: Diagnosis not present

## 2023-07-10 DIAGNOSIS — K5730 Diverticulosis of large intestine without perforation or abscess without bleeding: Secondary | ICD-10-CM | POA: Diagnosis not present

## 2023-07-10 DIAGNOSIS — I10 Essential (primary) hypertension: Secondary | ICD-10-CM | POA: Diagnosis not present

## 2023-07-10 DIAGNOSIS — G4700 Insomnia, unspecified: Secondary | ICD-10-CM | POA: Diagnosis not present

## 2023-07-10 DIAGNOSIS — N179 Acute kidney failure, unspecified: Secondary | ICD-10-CM | POA: Diagnosis not present

## 2023-07-10 DIAGNOSIS — I700 Atherosclerosis of aorta: Secondary | ICD-10-CM | POA: Diagnosis not present

## 2023-07-10 DIAGNOSIS — M25562 Pain in left knee: Secondary | ICD-10-CM | POA: Diagnosis not present

## 2023-07-10 DIAGNOSIS — E1140 Type 2 diabetes mellitus with diabetic neuropathy, unspecified: Secondary | ICD-10-CM | POA: Diagnosis not present

## 2023-07-10 DIAGNOSIS — Z91014 Allergy to mammalian meats: Secondary | ICD-10-CM | POA: Diagnosis not present

## 2023-07-10 DIAGNOSIS — Z883 Allergy status to other anti-infective agents status: Secondary | ICD-10-CM | POA: Diagnosis not present

## 2023-07-13 ENCOUNTER — Ambulatory Visit: Payer: Medicare Other | Admitting: Family Medicine

## 2023-07-13 DIAGNOSIS — Z9181 History of falling: Secondary | ICD-10-CM | POA: Diagnosis not present

## 2023-07-13 DIAGNOSIS — M6282 Rhabdomyolysis: Secondary | ICD-10-CM | POA: Diagnosis not present

## 2023-07-13 DIAGNOSIS — E1140 Type 2 diabetes mellitus with diabetic neuropathy, unspecified: Secondary | ICD-10-CM | POA: Diagnosis not present

## 2023-07-13 DIAGNOSIS — M6281 Muscle weakness (generalized): Secondary | ICD-10-CM | POA: Diagnosis not present

## 2023-07-14 DIAGNOSIS — M6282 Rhabdomyolysis: Secondary | ICD-10-CM | POA: Diagnosis not present

## 2023-07-14 DIAGNOSIS — I10 Essential (primary) hypertension: Secondary | ICD-10-CM | POA: Diagnosis not present

## 2023-07-14 DIAGNOSIS — E1140 Type 2 diabetes mellitus with diabetic neuropathy, unspecified: Secondary | ICD-10-CM | POA: Diagnosis not present

## 2023-07-14 DIAGNOSIS — N179 Acute kidney failure, unspecified: Secondary | ICD-10-CM | POA: Diagnosis not present

## 2023-07-15 DIAGNOSIS — E119 Type 2 diabetes mellitus without complications: Secondary | ICD-10-CM | POA: Diagnosis not present

## 2023-07-15 DIAGNOSIS — U071 COVID-19: Secondary | ICD-10-CM | POA: Diagnosis not present

## 2023-07-15 DIAGNOSIS — N390 Urinary tract infection, site not specified: Secondary | ICD-10-CM | POA: Diagnosis not present

## 2023-07-15 DIAGNOSIS — R531 Weakness: Secondary | ICD-10-CM | POA: Diagnosis not present

## 2023-07-16 DIAGNOSIS — N390 Urinary tract infection, site not specified: Secondary | ICD-10-CM | POA: Diagnosis not present

## 2023-07-21 DIAGNOSIS — U071 COVID-19: Secondary | ICD-10-CM | POA: Diagnosis not present

## 2023-07-21 DIAGNOSIS — M1611 Unilateral primary osteoarthritis, right hip: Secondary | ICD-10-CM | POA: Diagnosis not present

## 2023-07-21 DIAGNOSIS — M6281 Muscle weakness (generalized): Secondary | ICD-10-CM | POA: Diagnosis not present

## 2023-07-24 ENCOUNTER — Inpatient Hospital Stay: Payer: Medicare Other

## 2023-07-27 DIAGNOSIS — R296 Repeated falls: Secondary | ICD-10-CM | POA: Diagnosis not present

## 2023-07-27 DIAGNOSIS — E876 Hypokalemia: Secondary | ICD-10-CM | POA: Diagnosis not present

## 2023-07-27 DIAGNOSIS — M6282 Rhabdomyolysis: Secondary | ICD-10-CM | POA: Diagnosis not present

## 2023-07-29 DIAGNOSIS — Z883 Allergy status to other anti-infective agents status: Secondary | ICD-10-CM | POA: Diagnosis not present

## 2023-07-29 DIAGNOSIS — K5730 Diverticulosis of large intestine without perforation or abscess without bleeding: Secondary | ICD-10-CM | POA: Diagnosis not present

## 2023-07-29 DIAGNOSIS — Z87891 Personal history of nicotine dependence: Secondary | ICD-10-CM | POA: Diagnosis not present

## 2023-07-29 DIAGNOSIS — R296 Repeated falls: Secondary | ICD-10-CM | POA: Diagnosis not present

## 2023-07-29 DIAGNOSIS — M6281 Muscle weakness (generalized): Secondary | ICD-10-CM | POA: Diagnosis not present

## 2023-07-29 DIAGNOSIS — I700 Atherosclerosis of aorta: Secondary | ICD-10-CM | POA: Diagnosis not present

## 2023-07-29 DIAGNOSIS — N3289 Other specified disorders of bladder: Secondary | ICD-10-CM | POA: Diagnosis not present

## 2023-07-29 DIAGNOSIS — Z91014 Allergy to mammalian meats: Secondary | ICD-10-CM | POA: Diagnosis not present

## 2023-07-29 DIAGNOSIS — Z9049 Acquired absence of other specified parts of digestive tract: Secondary | ICD-10-CM | POA: Diagnosis not present

## 2023-07-29 DIAGNOSIS — Z880 Allergy status to penicillin: Secondary | ICD-10-CM | POA: Diagnosis not present

## 2023-07-29 DIAGNOSIS — R000 Tachycardia, unspecified: Secondary | ICD-10-CM | POA: Diagnosis not present

## 2023-07-29 DIAGNOSIS — R339 Retention of urine, unspecified: Secondary | ICD-10-CM | POA: Diagnosis not present

## 2023-07-29 DIAGNOSIS — N390 Urinary tract infection, site not specified: Secondary | ICD-10-CM | POA: Diagnosis not present

## 2023-07-29 DIAGNOSIS — Z888 Allergy status to other drugs, medicaments and biological substances status: Secondary | ICD-10-CM | POA: Diagnosis not present

## 2023-07-31 ENCOUNTER — Ambulatory Visit: Payer: Medicare Other | Admitting: Oncology

## 2023-07-31 ENCOUNTER — Inpatient Hospital Stay: Payer: Medicare Other | Admitting: Oncology

## 2023-07-31 DIAGNOSIS — N390 Urinary tract infection, site not specified: Secondary | ICD-10-CM | POA: Diagnosis not present

## 2023-07-31 DIAGNOSIS — R339 Retention of urine, unspecified: Secondary | ICD-10-CM | POA: Diagnosis not present

## 2023-07-31 DIAGNOSIS — R296 Repeated falls: Secondary | ICD-10-CM | POA: Diagnosis not present

## 2023-08-18 DIAGNOSIS — N390 Urinary tract infection, site not specified: Secondary | ICD-10-CM | POA: Diagnosis not present

## 2023-08-18 DIAGNOSIS — R296 Repeated falls: Secondary | ICD-10-CM | POA: Diagnosis not present

## 2023-08-18 DIAGNOSIS — R531 Weakness: Secondary | ICD-10-CM | POA: Diagnosis not present

## 2023-08-18 DIAGNOSIS — K219 Gastro-esophageal reflux disease without esophagitis: Secondary | ICD-10-CM | POA: Diagnosis not present

## 2023-08-20 ENCOUNTER — Other Ambulatory Visit: Payer: Self-pay | Admitting: Family Medicine

## 2023-08-27 DIAGNOSIS — M25562 Pain in left knee: Secondary | ICD-10-CM | POA: Diagnosis not present

## 2023-08-27 DIAGNOSIS — E876 Hypokalemia: Secondary | ICD-10-CM | POA: Diagnosis not present

## 2023-08-27 DIAGNOSIS — M1611 Unilateral primary osteoarthritis, right hip: Secondary | ICD-10-CM | POA: Diagnosis not present

## 2023-08-27 DIAGNOSIS — Z9181 History of falling: Secondary | ICD-10-CM | POA: Diagnosis not present

## 2023-08-27 DIAGNOSIS — M6282 Rhabdomyolysis: Secondary | ICD-10-CM | POA: Diagnosis not present

## 2023-08-27 DIAGNOSIS — K219 Gastro-esophageal reflux disease without esophagitis: Secondary | ICD-10-CM | POA: Diagnosis not present

## 2023-08-27 DIAGNOSIS — E1140 Type 2 diabetes mellitus with diabetic neuropathy, unspecified: Secondary | ICD-10-CM | POA: Diagnosis not present

## 2023-08-27 DIAGNOSIS — I10 Essential (primary) hypertension: Secondary | ICD-10-CM | POA: Diagnosis not present

## 2023-08-27 DIAGNOSIS — E785 Hyperlipidemia, unspecified: Secondary | ICD-10-CM | POA: Diagnosis not present

## 2023-08-27 DIAGNOSIS — N179 Acute kidney failure, unspecified: Secondary | ICD-10-CM | POA: Diagnosis not present

## 2023-08-27 DIAGNOSIS — M6281 Muscle weakness (generalized): Secondary | ICD-10-CM | POA: Diagnosis not present

## 2023-08-27 DIAGNOSIS — M47815 Spondylosis without myelopathy or radiculopathy, thoracolumbar region: Secondary | ICD-10-CM | POA: Diagnosis not present

## 2023-08-27 DIAGNOSIS — E119 Type 2 diabetes mellitus without complications: Secondary | ICD-10-CM | POA: Diagnosis not present

## 2023-08-27 DIAGNOSIS — R531 Weakness: Secondary | ICD-10-CM | POA: Diagnosis not present

## 2023-08-27 DIAGNOSIS — G4700 Insomnia, unspecified: Secondary | ICD-10-CM | POA: Diagnosis not present

## 2023-08-27 DIAGNOSIS — M25561 Pain in right knee: Secondary | ICD-10-CM | POA: Diagnosis not present

## 2023-08-27 DIAGNOSIS — U071 COVID-19: Secondary | ICD-10-CM | POA: Diagnosis not present

## 2023-08-27 DIAGNOSIS — E782 Mixed hyperlipidemia: Secondary | ICD-10-CM | POA: Diagnosis not present

## 2023-08-30 DIAGNOSIS — E876 Hypokalemia: Secondary | ICD-10-CM | POA: Diagnosis not present

## 2023-08-30 DIAGNOSIS — M1611 Unilateral primary osteoarthritis, right hip: Secondary | ICD-10-CM | POA: Diagnosis not present

## 2023-08-30 DIAGNOSIS — U071 COVID-19: Secondary | ICD-10-CM | POA: Diagnosis not present

## 2023-08-30 DIAGNOSIS — Z9181 History of falling: Secondary | ICD-10-CM | POA: Diagnosis not present

## 2023-08-30 DIAGNOSIS — M25562 Pain in left knee: Secondary | ICD-10-CM | POA: Diagnosis not present

## 2023-08-30 DIAGNOSIS — E785 Hyperlipidemia, unspecified: Secondary | ICD-10-CM | POA: Diagnosis not present

## 2023-08-30 DIAGNOSIS — K219 Gastro-esophageal reflux disease without esophagitis: Secondary | ICD-10-CM | POA: Diagnosis not present

## 2023-08-30 DIAGNOSIS — G4700 Insomnia, unspecified: Secondary | ICD-10-CM | POA: Diagnosis not present

## 2023-08-30 DIAGNOSIS — M6281 Muscle weakness (generalized): Secondary | ICD-10-CM | POA: Diagnosis not present

## 2023-08-30 DIAGNOSIS — M25561 Pain in right knee: Secondary | ICD-10-CM | POA: Diagnosis not present

## 2023-08-30 DIAGNOSIS — M47815 Spondylosis without myelopathy or radiculopathy, thoracolumbar region: Secondary | ICD-10-CM | POA: Diagnosis not present

## 2023-08-30 DIAGNOSIS — R531 Weakness: Secondary | ICD-10-CM | POA: Diagnosis not present

## 2023-08-30 DIAGNOSIS — I10 Essential (primary) hypertension: Secondary | ICD-10-CM | POA: Diagnosis not present

## 2023-08-30 DIAGNOSIS — N179 Acute kidney failure, unspecified: Secondary | ICD-10-CM | POA: Diagnosis not present

## 2023-08-30 DIAGNOSIS — E1140 Type 2 diabetes mellitus with diabetic neuropathy, unspecified: Secondary | ICD-10-CM | POA: Diagnosis not present

## 2023-08-30 DIAGNOSIS — M6282 Rhabdomyolysis: Secondary | ICD-10-CM | POA: Diagnosis not present

## 2023-08-31 DIAGNOSIS — K219 Gastro-esophageal reflux disease without esophagitis: Secondary | ICD-10-CM | POA: Diagnosis not present

## 2023-08-31 DIAGNOSIS — E119 Type 2 diabetes mellitus without complications: Secondary | ICD-10-CM | POA: Diagnosis not present

## 2023-08-31 DIAGNOSIS — E7800 Pure hypercholesterolemia, unspecified: Secondary | ICD-10-CM | POA: Diagnosis not present

## 2023-09-01 DIAGNOSIS — E1140 Type 2 diabetes mellitus with diabetic neuropathy, unspecified: Secondary | ICD-10-CM | POA: Diagnosis not present

## 2023-09-01 DIAGNOSIS — E559 Vitamin D deficiency, unspecified: Secondary | ICD-10-CM | POA: Diagnosis not present

## 2023-09-04 DIAGNOSIS — Z9181 History of falling: Secondary | ICD-10-CM | POA: Diagnosis not present

## 2023-09-04 DIAGNOSIS — E1140 Type 2 diabetes mellitus with diabetic neuropathy, unspecified: Secondary | ICD-10-CM | POA: Diagnosis not present

## 2023-09-04 DIAGNOSIS — U071 COVID-19: Secondary | ICD-10-CM | POA: Diagnosis not present

## 2023-09-04 DIAGNOSIS — M6282 Rhabdomyolysis: Secondary | ICD-10-CM | POA: Diagnosis not present

## 2023-09-04 DIAGNOSIS — M1611 Unilateral primary osteoarthritis, right hip: Secondary | ICD-10-CM | POA: Diagnosis not present

## 2023-09-04 DIAGNOSIS — M25561 Pain in right knee: Secondary | ICD-10-CM | POA: Diagnosis not present

## 2023-09-04 DIAGNOSIS — E785 Hyperlipidemia, unspecified: Secondary | ICD-10-CM | POA: Diagnosis not present

## 2023-09-04 DIAGNOSIS — G4700 Insomnia, unspecified: Secondary | ICD-10-CM | POA: Diagnosis not present

## 2023-09-04 DIAGNOSIS — K219 Gastro-esophageal reflux disease without esophagitis: Secondary | ICD-10-CM | POA: Diagnosis not present

## 2023-09-04 DIAGNOSIS — N179 Acute kidney failure, unspecified: Secondary | ICD-10-CM | POA: Diagnosis not present

## 2023-09-04 DIAGNOSIS — I10 Essential (primary) hypertension: Secondary | ICD-10-CM | POA: Diagnosis not present

## 2023-09-04 DIAGNOSIS — M47815 Spondylosis without myelopathy or radiculopathy, thoracolumbar region: Secondary | ICD-10-CM | POA: Diagnosis not present

## 2023-09-04 DIAGNOSIS — M25562 Pain in left knee: Secondary | ICD-10-CM | POA: Diagnosis not present

## 2023-09-04 DIAGNOSIS — R531 Weakness: Secondary | ICD-10-CM | POA: Diagnosis not present

## 2023-09-04 DIAGNOSIS — M6281 Muscle weakness (generalized): Secondary | ICD-10-CM | POA: Diagnosis not present

## 2023-09-04 DIAGNOSIS — E876 Hypokalemia: Secondary | ICD-10-CM | POA: Diagnosis not present

## 2023-09-07 ENCOUNTER — Ambulatory Visit: Payer: Medicare Other | Admitting: Family Medicine

## 2023-09-08 ENCOUNTER — Encounter: Payer: Self-pay | Admitting: Family Medicine

## 2023-09-08 DIAGNOSIS — Z9181 History of falling: Secondary | ICD-10-CM | POA: Diagnosis not present

## 2023-09-08 DIAGNOSIS — E1140 Type 2 diabetes mellitus with diabetic neuropathy, unspecified: Secondary | ICD-10-CM | POA: Diagnosis not present

## 2023-09-08 DIAGNOSIS — R531 Weakness: Secondary | ICD-10-CM | POA: Diagnosis not present

## 2023-09-08 DIAGNOSIS — M25561 Pain in right knee: Secondary | ICD-10-CM | POA: Diagnosis not present

## 2023-09-08 DIAGNOSIS — M6281 Muscle weakness (generalized): Secondary | ICD-10-CM | POA: Diagnosis not present

## 2023-09-08 DIAGNOSIS — E876 Hypokalemia: Secondary | ICD-10-CM | POA: Diagnosis not present

## 2023-09-08 DIAGNOSIS — M47815 Spondylosis without myelopathy or radiculopathy, thoracolumbar region: Secondary | ICD-10-CM | POA: Diagnosis not present

## 2023-09-08 DIAGNOSIS — M25562 Pain in left knee: Secondary | ICD-10-CM | POA: Diagnosis not present

## 2023-09-08 DIAGNOSIS — M1611 Unilateral primary osteoarthritis, right hip: Secondary | ICD-10-CM | POA: Diagnosis not present

## 2023-09-08 DIAGNOSIS — U071 COVID-19: Secondary | ICD-10-CM | POA: Diagnosis not present

## 2023-09-08 DIAGNOSIS — M6282 Rhabdomyolysis: Secondary | ICD-10-CM | POA: Diagnosis not present

## 2023-09-08 DIAGNOSIS — I10 Essential (primary) hypertension: Secondary | ICD-10-CM | POA: Diagnosis not present

## 2023-09-08 DIAGNOSIS — G4700 Insomnia, unspecified: Secondary | ICD-10-CM | POA: Diagnosis not present

## 2023-09-08 DIAGNOSIS — K219 Gastro-esophageal reflux disease without esophagitis: Secondary | ICD-10-CM | POA: Diagnosis not present

## 2023-09-08 DIAGNOSIS — E785 Hyperlipidemia, unspecified: Secondary | ICD-10-CM | POA: Diagnosis not present

## 2023-09-08 DIAGNOSIS — N179 Acute kidney failure, unspecified: Secondary | ICD-10-CM | POA: Diagnosis not present

## 2023-09-10 DIAGNOSIS — M47815 Spondylosis without myelopathy or radiculopathy, thoracolumbar region: Secondary | ICD-10-CM | POA: Diagnosis not present

## 2023-09-10 DIAGNOSIS — K219 Gastro-esophageal reflux disease without esophagitis: Secondary | ICD-10-CM | POA: Diagnosis not present

## 2023-09-10 DIAGNOSIS — E876 Hypokalemia: Secondary | ICD-10-CM | POA: Diagnosis not present

## 2023-09-10 DIAGNOSIS — I10 Essential (primary) hypertension: Secondary | ICD-10-CM | POA: Diagnosis not present

## 2023-09-10 DIAGNOSIS — U071 COVID-19: Secondary | ICD-10-CM | POA: Diagnosis not present

## 2023-09-10 DIAGNOSIS — M1611 Unilateral primary osteoarthritis, right hip: Secondary | ICD-10-CM | POA: Diagnosis not present

## 2023-09-10 DIAGNOSIS — M25561 Pain in right knee: Secondary | ICD-10-CM | POA: Diagnosis not present

## 2023-09-10 DIAGNOSIS — Z9181 History of falling: Secondary | ICD-10-CM | POA: Diagnosis not present

## 2023-09-10 DIAGNOSIS — R531 Weakness: Secondary | ICD-10-CM | POA: Diagnosis not present

## 2023-09-10 DIAGNOSIS — M25562 Pain in left knee: Secondary | ICD-10-CM | POA: Diagnosis not present

## 2023-09-10 DIAGNOSIS — G4700 Insomnia, unspecified: Secondary | ICD-10-CM | POA: Diagnosis not present

## 2023-09-10 DIAGNOSIS — E1140 Type 2 diabetes mellitus with diabetic neuropathy, unspecified: Secondary | ICD-10-CM | POA: Diagnosis not present

## 2023-09-10 DIAGNOSIS — M6281 Muscle weakness (generalized): Secondary | ICD-10-CM | POA: Diagnosis not present

## 2023-09-10 DIAGNOSIS — N179 Acute kidney failure, unspecified: Secondary | ICD-10-CM | POA: Diagnosis not present

## 2023-09-10 DIAGNOSIS — M6282 Rhabdomyolysis: Secondary | ICD-10-CM | POA: Diagnosis not present

## 2023-09-10 DIAGNOSIS — E785 Hyperlipidemia, unspecified: Secondary | ICD-10-CM | POA: Diagnosis not present

## 2023-09-15 DIAGNOSIS — N179 Acute kidney failure, unspecified: Secondary | ICD-10-CM | POA: Diagnosis not present

## 2023-09-15 DIAGNOSIS — U071 COVID-19: Secondary | ICD-10-CM | POA: Diagnosis not present

## 2023-09-15 DIAGNOSIS — M47815 Spondylosis without myelopathy or radiculopathy, thoracolumbar region: Secondary | ICD-10-CM | POA: Diagnosis not present

## 2023-09-15 DIAGNOSIS — E785 Hyperlipidemia, unspecified: Secondary | ICD-10-CM | POA: Diagnosis not present

## 2023-09-15 DIAGNOSIS — R531 Weakness: Secondary | ICD-10-CM | POA: Diagnosis not present

## 2023-09-15 DIAGNOSIS — M25562 Pain in left knee: Secondary | ICD-10-CM | POA: Diagnosis not present

## 2023-09-15 DIAGNOSIS — K219 Gastro-esophageal reflux disease without esophagitis: Secondary | ICD-10-CM | POA: Diagnosis not present

## 2023-09-15 DIAGNOSIS — I10 Essential (primary) hypertension: Secondary | ICD-10-CM | POA: Diagnosis not present

## 2023-09-15 DIAGNOSIS — M25561 Pain in right knee: Secondary | ICD-10-CM | POA: Diagnosis not present

## 2023-09-15 DIAGNOSIS — M6282 Rhabdomyolysis: Secondary | ICD-10-CM | POA: Diagnosis not present

## 2023-09-15 DIAGNOSIS — G4700 Insomnia, unspecified: Secondary | ICD-10-CM | POA: Diagnosis not present

## 2023-09-15 DIAGNOSIS — M1611 Unilateral primary osteoarthritis, right hip: Secondary | ICD-10-CM | POA: Diagnosis not present

## 2023-09-15 DIAGNOSIS — M6281 Muscle weakness (generalized): Secondary | ICD-10-CM | POA: Diagnosis not present

## 2023-09-15 DIAGNOSIS — E876 Hypokalemia: Secondary | ICD-10-CM | POA: Diagnosis not present

## 2023-09-15 DIAGNOSIS — E1140 Type 2 diabetes mellitus with diabetic neuropathy, unspecified: Secondary | ICD-10-CM | POA: Diagnosis not present

## 2023-09-15 DIAGNOSIS — Z9181 History of falling: Secondary | ICD-10-CM | POA: Diagnosis not present

## 2023-09-17 DIAGNOSIS — M47815 Spondylosis without myelopathy or radiculopathy, thoracolumbar region: Secondary | ICD-10-CM | POA: Diagnosis not present

## 2023-09-17 DIAGNOSIS — U071 COVID-19: Secondary | ICD-10-CM | POA: Diagnosis not present

## 2023-09-17 DIAGNOSIS — M6281 Muscle weakness (generalized): Secondary | ICD-10-CM | POA: Diagnosis not present

## 2023-09-17 DIAGNOSIS — K219 Gastro-esophageal reflux disease without esophagitis: Secondary | ICD-10-CM | POA: Diagnosis not present

## 2023-09-17 DIAGNOSIS — E785 Hyperlipidemia, unspecified: Secondary | ICD-10-CM | POA: Diagnosis not present

## 2023-09-17 DIAGNOSIS — Z9181 History of falling: Secondary | ICD-10-CM | POA: Diagnosis not present

## 2023-09-17 DIAGNOSIS — M25561 Pain in right knee: Secondary | ICD-10-CM | POA: Diagnosis not present

## 2023-09-17 DIAGNOSIS — I10 Essential (primary) hypertension: Secondary | ICD-10-CM | POA: Diagnosis not present

## 2023-09-17 DIAGNOSIS — R531 Weakness: Secondary | ICD-10-CM | POA: Diagnosis not present

## 2023-09-17 DIAGNOSIS — G4700 Insomnia, unspecified: Secondary | ICD-10-CM | POA: Diagnosis not present

## 2023-09-17 DIAGNOSIS — M6282 Rhabdomyolysis: Secondary | ICD-10-CM | POA: Diagnosis not present

## 2023-09-17 DIAGNOSIS — E876 Hypokalemia: Secondary | ICD-10-CM | POA: Diagnosis not present

## 2023-09-17 DIAGNOSIS — E1140 Type 2 diabetes mellitus with diabetic neuropathy, unspecified: Secondary | ICD-10-CM | POA: Diagnosis not present

## 2023-09-17 DIAGNOSIS — N179 Acute kidney failure, unspecified: Secondary | ICD-10-CM | POA: Diagnosis not present

## 2023-09-17 DIAGNOSIS — M1611 Unilateral primary osteoarthritis, right hip: Secondary | ICD-10-CM | POA: Diagnosis not present

## 2023-09-17 DIAGNOSIS — M25562 Pain in left knee: Secondary | ICD-10-CM | POA: Diagnosis not present

## 2023-09-22 DIAGNOSIS — N179 Acute kidney failure, unspecified: Secondary | ICD-10-CM | POA: Diagnosis not present

## 2023-09-22 DIAGNOSIS — I10 Essential (primary) hypertension: Secondary | ICD-10-CM | POA: Diagnosis not present

## 2023-09-22 DIAGNOSIS — M6281 Muscle weakness (generalized): Secondary | ICD-10-CM | POA: Diagnosis not present

## 2023-09-22 DIAGNOSIS — M25562 Pain in left knee: Secondary | ICD-10-CM | POA: Diagnosis not present

## 2023-09-22 DIAGNOSIS — E876 Hypokalemia: Secondary | ICD-10-CM | POA: Diagnosis not present

## 2023-09-22 DIAGNOSIS — Z9181 History of falling: Secondary | ICD-10-CM | POA: Diagnosis not present

## 2023-09-22 DIAGNOSIS — E1140 Type 2 diabetes mellitus with diabetic neuropathy, unspecified: Secondary | ICD-10-CM | POA: Diagnosis not present

## 2023-09-22 DIAGNOSIS — E785 Hyperlipidemia, unspecified: Secondary | ICD-10-CM | POA: Diagnosis not present

## 2023-09-22 DIAGNOSIS — R531 Weakness: Secondary | ICD-10-CM | POA: Diagnosis not present

## 2023-09-22 DIAGNOSIS — M47815 Spondylosis without myelopathy or radiculopathy, thoracolumbar region: Secondary | ICD-10-CM | POA: Diagnosis not present

## 2023-09-22 DIAGNOSIS — U071 COVID-19: Secondary | ICD-10-CM | POA: Diagnosis not present

## 2023-09-22 DIAGNOSIS — G4700 Insomnia, unspecified: Secondary | ICD-10-CM | POA: Diagnosis not present

## 2023-09-22 DIAGNOSIS — M6282 Rhabdomyolysis: Secondary | ICD-10-CM | POA: Diagnosis not present

## 2023-09-22 DIAGNOSIS — K219 Gastro-esophageal reflux disease without esophagitis: Secondary | ICD-10-CM | POA: Diagnosis not present

## 2023-09-22 DIAGNOSIS — M1611 Unilateral primary osteoarthritis, right hip: Secondary | ICD-10-CM | POA: Diagnosis not present

## 2023-09-22 DIAGNOSIS — M25561 Pain in right knee: Secondary | ICD-10-CM | POA: Diagnosis not present

## 2024-01-05 DEATH — deceased
